# Patient Record
Sex: Female | Born: 1950 | Race: Black or African American | Hispanic: No | Marital: Married | Smoking: Never smoker
Health system: Southern US, Community
[De-identification: ages and names within clinical notes are randomized; demographics above are authoritative.]

## PROBLEM LIST (undated history)

## (undated) DIAGNOSIS — J45909 Unspecified asthma, uncomplicated: Secondary | ICD-10-CM

## (undated) DIAGNOSIS — F419 Anxiety disorder, unspecified: Secondary | ICD-10-CM

## (undated) DIAGNOSIS — D649 Anemia, unspecified: Secondary | ICD-10-CM

## (undated) DIAGNOSIS — I2510 Atherosclerotic heart disease of native coronary artery without angina pectoris: Secondary | ICD-10-CM

## (undated) DIAGNOSIS — I219 Acute myocardial infarction, unspecified: Secondary | ICD-10-CM

## (undated) DIAGNOSIS — G4700 Insomnia, unspecified: Secondary | ICD-10-CM

## (undated) DIAGNOSIS — I10 Essential (primary) hypertension: Secondary | ICD-10-CM

## (undated) DIAGNOSIS — K219 Gastro-esophageal reflux disease without esophagitis: Secondary | ICD-10-CM

## (undated) DIAGNOSIS — E785 Hyperlipidemia, unspecified: Secondary | ICD-10-CM

## (undated) DIAGNOSIS — M1990 Unspecified osteoarthritis, unspecified site: Secondary | ICD-10-CM

## (undated) DIAGNOSIS — F32A Depression, unspecified: Secondary | ICD-10-CM

## (undated) DIAGNOSIS — E119 Type 2 diabetes mellitus without complications: Secondary | ICD-10-CM

## (undated) HISTORY — PX: BREAST SURGERY: SHX581

## (undated) HISTORY — PX: TUBAL LIGATION: SHX77

## (undated) HISTORY — DX: Type 2 diabetes mellitus without complications: E11.9

## (undated) HISTORY — DX: Anxiety disorder, unspecified: F41.9

## (undated) HISTORY — DX: Hyperlipidemia, unspecified: E78.5

## (undated) HISTORY — PX: ABDOMINAL HYSTERECTOMY: SHX81

## (undated) HISTORY — DX: Unspecified asthma, uncomplicated: J45.909

## (undated) HISTORY — DX: Gastro-esophageal reflux disease without esophagitis: K21.9

## (undated) HISTORY — DX: Insomnia, unspecified: G47.00

---

## 1993-06-10 HISTORY — PX: REDUCTION MAMMAPLASTY: SUR839

## 2005-01-22 ENCOUNTER — Ambulatory Visit: Payer: Self-pay | Admitting: Unknown Physician Specialty

## 2006-03-21 ENCOUNTER — Ambulatory Visit: Payer: Self-pay | Admitting: Family Medicine

## 2007-03-11 LAB — HM DEXA SCAN

## 2007-05-05 ENCOUNTER — Ambulatory Visit: Payer: Self-pay | Admitting: Family Medicine

## 2007-09-26 ENCOUNTER — Ambulatory Visit: Payer: Self-pay | Admitting: Family Medicine

## 2008-01-26 ENCOUNTER — Ambulatory Visit: Payer: Self-pay | Admitting: Internal Medicine

## 2008-01-27 ENCOUNTER — Ambulatory Visit: Payer: Self-pay | Admitting: Internal Medicine

## 2008-05-11 ENCOUNTER — Ambulatory Visit: Payer: Self-pay | Admitting: Family Medicine

## 2008-08-24 ENCOUNTER — Ambulatory Visit: Payer: Self-pay | Admitting: Family Medicine

## 2009-08-28 ENCOUNTER — Ambulatory Visit: Payer: Self-pay | Admitting: Family Medicine

## 2011-02-26 ENCOUNTER — Ambulatory Visit: Payer: Self-pay | Admitting: Family Medicine

## 2011-12-04 ENCOUNTER — Ambulatory Visit: Payer: Self-pay | Admitting: Family Medicine

## 2012-06-29 ENCOUNTER — Ambulatory Visit: Payer: Self-pay | Admitting: Family Medicine

## 2013-06-18 ENCOUNTER — Ambulatory Visit: Payer: Self-pay | Admitting: Gastroenterology

## 2013-06-18 LAB — HM COLONOSCOPY: HM Colonoscopy: NORMAL

## 2013-07-13 LAB — LIPID PANEL
Cholesterol: 156 mg/dL (ref 0–200)
HDL: 35 mg/dL (ref 35–70)
LDL Cholesterol: 98 mg/dL
Triglycerides: 117 mg/dL (ref 40–160)

## 2013-09-23 LAB — HEMOGLOBIN A1C: Hgb A1c MFr Bld: 6.1 % — AB (ref 4.0–6.0)

## 2014-05-04 ENCOUNTER — Ambulatory Visit: Payer: Self-pay | Admitting: Family Medicine

## 2014-05-04 LAB — HM MAMMOGRAPHY: HM Mammogram: NORMAL

## 2014-11-18 ENCOUNTER — Encounter: Payer: Self-pay | Admitting: Family Medicine

## 2014-11-18 ENCOUNTER — Ambulatory Visit (INDEPENDENT_AMBULATORY_CARE_PROVIDER_SITE_OTHER): Payer: PPO | Admitting: Family Medicine

## 2014-11-18 VITALS — BP 124/68 | HR 92 | Temp 97.9°F | Resp 16 | Ht 63.0 in | Wt 163.8 lb

## 2014-11-18 DIAGNOSIS — I10 Essential (primary) hypertension: Secondary | ICD-10-CM | POA: Insufficient documentation

## 2014-11-18 DIAGNOSIS — E2839 Other primary ovarian failure: Secondary | ICD-10-CM

## 2014-11-18 DIAGNOSIS — E119 Type 2 diabetes mellitus without complications: Secondary | ICD-10-CM

## 2014-11-18 DIAGNOSIS — K219 Gastro-esophageal reflux disease without esophagitis: Secondary | ICD-10-CM | POA: Insufficient documentation

## 2014-11-18 DIAGNOSIS — J4520 Mild intermittent asthma, uncomplicated: Secondary | ICD-10-CM | POA: Insufficient documentation

## 2014-11-18 DIAGNOSIS — Z23 Encounter for immunization: Secondary | ICD-10-CM | POA: Diagnosis not present

## 2014-11-18 DIAGNOSIS — A6009 Herpesviral infection of other urogenital tract: Secondary | ICD-10-CM | POA: Insufficient documentation

## 2014-11-18 DIAGNOSIS — I2510 Atherosclerotic heart disease of native coronary artery without angina pectoris: Secondary | ICD-10-CM | POA: Insufficient documentation

## 2014-11-18 DIAGNOSIS — Z719 Counseling, unspecified: Secondary | ICD-10-CM

## 2014-11-18 DIAGNOSIS — Z0000 Encounter for general adult medical examination without abnormal findings: Secondary | ICD-10-CM | POA: Diagnosis not present

## 2014-11-18 DIAGNOSIS — E785 Hyperlipidemia, unspecified: Secondary | ICD-10-CM | POA: Diagnosis not present

## 2014-11-18 DIAGNOSIS — Z780 Asymptomatic menopausal state: Secondary | ICD-10-CM | POA: Insufficient documentation

## 2014-11-18 DIAGNOSIS — Z7189 Other specified counseling: Secondary | ICD-10-CM

## 2014-11-18 DIAGNOSIS — G4700 Insomnia, unspecified: Secondary | ICD-10-CM | POA: Insufficient documentation

## 2014-11-18 DIAGNOSIS — I252 Old myocardial infarction: Secondary | ICD-10-CM | POA: Insufficient documentation

## 2014-11-18 NOTE — Progress Notes (Signed)
Name: Dawn Werner   MRN: [id number]    DOB: 09-08-1950   Date:11/18/2014       Progress Note  Subjective  Chief Complaint  Chief Complaint  Patient presents with  . Annual Exam    HPI  Welcome to Medicare visit: the only complaint she has is right knee pain, seeing Ortho at [facility], going to have injections soon.  DMII: she has been taking Metformin and is tolerating it well. She checks fsbs a few times weekly and glucose is usually 120, highest 146, lowest level recently was 118.  Denies any neuropathic symptoms.  No weight changes, normal appetite, no hypoglycemic episodes.  CAD: taking statin, aspirin and denies any chest pain Last visit with Dr. Juliann Pares was Fall 2015   Patient Active Problem List   Diagnosis Date Noted  . Asthma, mild intermittent 11/18/2014  . Arteriosclerosis of coronary artery 11/18/2014  . Insomnia, persistent 11/18/2014  . Dyslipidemia 11/18/2014  . Acid reflux 11/18/2014  . Diabetes type 2, controlled 11/18/2014  . Genital herpes in women 11/18/2014  . HLD (hyperlipidemia) 11/18/2014  . Hypertension 11/18/2014  . Asymptomatic postmenopausal status 11/18/2014  . History of acute myocardial infarction 11/18/2014  . Arthritis of knee, degenerative 12/01/2013    Past Surgical History  Procedure Laterality Date  . Tubal ligation    . Abdominal hysterectomy    . Breast surgery      breast reduction    Family History  Problem Relation Age of Onset  . Hyperlipidemia Mother   . Hypertension Mother   . Stroke Mother   . Dementia Father   . Cancer Sister     breast  . Asthma Brother   . Depression Brother     bipolar  . Heart disease Brother   . Heart disease Brother     History   Social History  . Marital Status: Married    Spouse Name: N/A  . Number of Children: N/A  . Years of Education: N/A   Occupational History  . Not on file.   Social History Main Topics  . Smoking status: Never Smoker   . Smokeless tobacco: Not  on file  . Alcohol Use: No  . Drug Use: No  . Sexual Activity: Yes   Other Topics Concern  . Not on file   Social History Narrative  . No narrative on file     Current outpatient prescriptions:  .  acyclovir ointment (ZOVIRAX) 5 %, Apply 1 application topically 2 (two) times daily as needed., Disp: , Rfl:  .  albuterol (PROVENTIL HFA) 108 (90 BASE) MCG/ACT inhaler, Inhale 2 puffs into the lungs as needed., Disp: , Rfl:  .  aspirin EC 81 MG tablet, Take 1 tablet by mouth daily., Disp: , Rfl:  .  atorvastatin (LIPITOR) 80 MG tablet, Take 80 mg by mouth daily., Disp: , Rfl: 1 .  carvedilol (COREG) 6.25 MG tablet, Take 6.25 mg by mouth 2 (two) times daily., Disp: , Rfl: 1 .  LORazepam (ATIVAN) 0.5 MG tablet, Take by mouth at bedtime as needed. for sleep, Disp: , Rfl: 2 .  losartan (COZAAR) 50 MG tablet, Take 50 mg by mouth daily., Disp: , Rfl: 1 .  metFORMIN (GLUCOPHAGE) 500 MG tablet, Take 500 mg by mouth 2 (two) times daily., Disp: , Rfl: 1 .  omeprazole (PRILOSEC) 20 MG capsule, Take 1 capsule by mouth as needed., Disp: , Rfl:  .  valACYclovir (VALTREX) 500 MG tablet, Take 1 tablet by  mouth daily., Disp: , Rfl:  .  zolpidem (AMBIEN) 10 MG tablet, Take 10 mg by mouth at bedtime., Disp: , Rfl: 0  No Known Allergies   ROS  Constitutional: Negative for fever or weight change.  Respiratory: Negative for cough and shortness of breath.   Cardiovascular: Negative for chest pain or palpitations.  Gastrointestinal: Negative for abdominal pain, no bowel changes.  Musculoskeletal pain right knee Skin: Negative for rash.  Neurological: Negative for dizziness or headache.  No other specific complaints in a complete review of systems (except as listed in HPI above).  Objective  Filed Vitals:   11/18/14 0829  BP: 124/68  Pulse: 92  Temp: 97.9 F (36.6 C)  TempSrc: Oral  Resp: 16  Height: 5\' 3"  (1.6 m)  Weight: 163 lb 12.8 oz (74.299 kg)  SpO2: 95%    Body mass index is 29.02  kg/(m^2).  Physical Exam  Constitutional: Patient appears well-developed and well-nourished. No distress.  HENT: Head: Normocephalic and atraumatic. Ears: B TMs ok, no erythema or effusion; Nose: Nose normal. Mouth/Throat: Oropharynx is clear and moist. No oropharyngeal exudate.  Eyes: Conjunctivae and EOM are normal. Pupils are equal, round, and reactive to light. No scleral icterus.  Neck: Normal range of motion. Neck supple. No JVD present. No thyromegaly present.  Cardiovascular: Normal rate, regular rhythm and normal heart sounds.  No murmur heard. No BLE edema. Pulmonary/Chest: Effort normal and breath sounds normal. No respiratory distress. Abdominal: Soft. Bowel sounds are normal, no distension. There is no tenderness. no masses Breast: no lumps or masses, no nipple discharge or rashes FEMALE GENITALIA:  External genitalia normal External urethra normal RECTAL:  hemorrhoids Musculoskeletal: limping, mild right knee effusion, crepitus with extension of right knee, pain with rom of right knee Neurological: he is alert and oriented to person, place, and time. No cranial nerve deficit. Coordination, balance, strength, speech and gait are normal.  Skin: Skin is warm and dry. No rash noted. No erythema.  Psychiatric: Patient has a normal mood and affect. behavior is normal. Judgment and thought content normal.    Diabetic Foot Exam: Diabetic Foot Exam - Simple   Simple Foot Form  Visual Inspection  No deformities, no ulcerations, no other skin breakdown bilaterally:  Yes  Sensation Testing  Pulse Check  Comments      PHQ2/9: Depression screen [facility] 2/9 11/18/2014 11/18/2014  Decreased Interest 0 -  Down, Depressed, Hopeless 0 0  PHQ - 2 Score 0 0     Fall Risk: Fall Risk  11/18/2014  Falls in the past year? No    Functional Status Survey: Is the patient deaf or have difficulty hearing?: No Does the patient have difficulty seeing, even when wearing glasses/contacts?: Yes  (glasses) Does the patient have difficulty concentrating, remembering, or making decisions?: No Does the patient have difficulty walking or climbing stairs?: No Does the patient have difficulty dressing or bathing?: No Does the patient have difficulty doing errands alone such as visiting a doctor's office or shopping?: No   Assessment & Plan   1. Medicare welcome visit  Functional ability/safety issues: No Issues Hearing issues: Addressed  Activities of daily living: Discussed Home safety issues: No Issues  End Of Life Planning: Offered verbal information regarding advanced directives, healthcare power of attorney.  Preventative care, Health maintenance, Preventative health measures discussed.  Preventative screenings discussed today: lab work, colonoscopy, PAP, mammogram, DEXA.  Low Dose CT Chest recommended if Age 73-80 years, 30 pack-year currently smoking OR have quit  w/in 15years.   Lifestyle risk factor issued reviewed: Diet, exercise, weight management, advised patient smoking is not healthy, nutrition/diet.  Preventative health measures discussed (5-10 year plan).  Reviewed and recommended vaccinations: - Pneumovax  - Prevnar  - Annual Influenza - Zostavax - Tdap   Depression screening: Done Fall risk screening: Done Discuss ADLs/IADLs: Done  Current medical providers: See HPI  Other health risk factors identified this visit: No other issues Cognitive impairment issues: None identified  All above discussed with patient. Appropriate education, counseling and referral will be made based upon the above.    2. HLD (hyperlipidemia)  - Lipid Profile  3. Diabetes type 2, controlled  - HgB A1c  4. Essential hypertension She does not want to have an EKG today, had one done by Dr. Juliann Pares in Nov 2015 - Basic Metabolic Panel (BMET) - CBC with Differential  5. Health counseling Discussed importance of 150 minutes of physical activity weekly, eat two servings  of fish weekly, eat one serving of tree nuts ( cashews, pistachios, pecans, almonds.Marland Kitchen) every other day, eat 6 servings of fruit/vegetables daily and drink plenty of water and avoid sweet beverages.   6. Need for 23-polyvalent pneumococcal polysaccharide vaccine  - Pneumococcal polysaccharide vaccine 23-valent greater than or equal to 2yo subcutaneous/IM  7. Ovarian failure  - DG Bone Density; Future

## 2014-11-18 NOTE — Patient Instructions (Signed)
  Ms. Devoto , Thank you for taking time to come for your Medicare Wellness Visit. I appreciate your ongoing commitment to your health goals. Please review the following plan we discussed and let me know if I can assist you in the future.      This is a list of the screening recommended for you and due dates:  Health Maintenance  Topic Date Due  . Shingles Vaccine  01/17/2015*  . HIV Screening  05/06/2018*  . Urine Protein Check  12/25/2014  . Flu Shot  01/09/2015  . Hemoglobin A1C  05/20/2015  . Eye exam for diabetics  11/04/2015  . Complete foot exam   11/18/2015  . Pneumonia vaccines (2 of 2 - PPSV23) 11/18/2015  . Mammogram  05/04/2016  . Tetanus Vaccine  04/09/2017  . Colon Cancer Screening  06/19/2023  . DEXA scan (bone density measurement)  Completed  *Topic was postponed. The date shown is not the original due date.

## 2014-11-18 NOTE — Addendum Note (Signed)
Addended by: Alba Cory F on: 11/18/2014 01:59 PM   Modules accepted: Level of Service, SmartSet

## 2014-11-19 LAB — LIPID PANEL
Chol/HDL Ratio: 5.9 ratio units — ABNORMAL HIGH (ref 0.0–4.4)
Cholesterol, Total: 225 mg/dL — ABNORMAL HIGH (ref 100–199)
HDL: 38 mg/dL — ABNORMAL LOW (ref >39)
LDL Calculated: 140 mg/dL — ABNORMAL HIGH (ref 0–99)
Triglycerides: 235 mg/dL — ABNORMAL HIGH (ref 0–149)
VLDL Cholesterol Cal: 47 mg/dL — ABNORMAL HIGH (ref 5–40)

## 2014-11-19 LAB — CBC WITH DIFFERENTIAL/PLATELET
Basophils Absolute: 0 10*3/uL (ref 0.0–0.2)
Basos: 1 %
EOS (ABSOLUTE): 0.1 10*3/uL (ref 0.0–0.4)
Eos: 2 %
Hematocrit: 40.3 % (ref 34.0–46.6)
Hemoglobin: 13.7 g/dL (ref 11.1–15.9)
Immature Grans (Abs): 0 10*3/uL (ref 0.0–0.1)
Immature Granulocytes: 0 %
Lymphocytes Absolute: 2 10*3/uL (ref 0.7–3.1)
Lymphs: 27 %
MCH: 29 pg (ref 26.6–33.0)
MCHC: 34 g/dL (ref 31.5–35.7)
MCV: 85 fL (ref 79–97)
Monocytes Absolute: 0.6 10*3/uL (ref 0.1–0.9)
Monocytes: 8 %
Neutrophils Absolute: 4.8 10*3/uL (ref 1.4–7.0)
Neutrophils: 62 %
Platelets: 356 10*3/uL (ref 150–379)
RBC: 4.72 x10E6/uL (ref 3.77–5.28)
RDW: 12.5 % (ref 12.3–15.4)
WBC: 7.6 10*3/uL (ref 3.4–10.8)

## 2014-11-19 LAB — BASIC METABOLIC PANEL
BUN/Creatinine Ratio: 16 (ref 11–26)
BUN: 9 mg/dL (ref 8–27)
CO2: 25 mmol/L (ref 18–29)
Calcium: 9.9 mg/dL (ref 8.7–10.3)
Chloride: 102 mmol/L (ref 97–108)
Creatinine, Ser: 0.57 mg/dL (ref 0.57–1.00)
GFR calc Af Amer: 113 mL/min/{1.73_m2} (ref >59)
GFR calc non Af Amer: 98 mL/min/{1.73_m2} (ref >59)
Glucose: 126 mg/dL — ABNORMAL HIGH (ref 65–99)
Potassium: 4.3 mmol/L (ref 3.5–5.2)
Sodium: 143 mmol/L (ref 134–144)

## 2014-11-19 LAB — HEMOGLOBIN A1C
Est. average glucose Bld gHb Est-mCnc: 146 mg/dL
Hgb A1c MFr Bld: 6.7 % — ABNORMAL HIGH (ref 4.8–5.6)

## 2014-11-21 ENCOUNTER — Telehealth: Payer: Self-pay

## 2014-11-21 MED ORDER — ROSUVASTATIN CALCIUM 40 MG PO TABS: 40.0000 mg | ORAL_TABLET | Freq: Every day | ORAL | Status: DC

## 2014-11-21 NOTE — Telephone Encounter (Signed)
Sent Crestor

## 2014-11-21 NOTE — Telephone Encounter (Signed)
Patient notified of labs and states she is taking Lipitor every day. Could you please send in new medication Crestor 40 mg to pharmacy.

## 2014-11-21 NOTE — Telephone Encounter (Signed)
-----   Message from Dawn Cory, MD sent at 11/20/2014  9:19 AM EDT ----- Her lipid panel is out of control. Triglycerides is up, HDL is low and LDL is not at goal. Is she taking her Lipitor daily? If so we will change to Crestor 40mg  daily . Her fasting glucose was at goal and she also had a normal kidney and liver function test hgbA1C is going up, please make sure she is following a diabetic diet. No anemia,normal WBC Please notify patient, thank you

## 2014-12-16 ENCOUNTER — Other Ambulatory Visit: Payer: Self-pay | Admitting: Family Medicine

## 2014-12-21 ENCOUNTER — Other Ambulatory Visit: Payer: Self-pay | Admitting: Family Medicine

## 2014-12-21 NOTE — Telephone Encounter (Signed)
Called pharmacy and gave them the prescription that was on file from 12/17/14 from Dr. Carlynn PurlSowles for Ambien 10 mg 90 day supply and informed patient.

## 2014-12-21 NOTE — Telephone Encounter (Signed)
Called into pharmacy and patient notified

## 2014-12-21 NOTE — Telephone Encounter (Signed)
Pt is checking status on ambien. The pharmacy told her that she need a new prescription. Patient does not understand because she pays out of pocket. Please return her call to clarify [phone]

## 2014-12-28 ENCOUNTER — Other Ambulatory Visit: Payer: Self-pay | Admitting: Orthopedic Surgery

## 2014-12-28 DIAGNOSIS — M25561 Pain in right knee: Secondary | ICD-10-CM

## 2014-12-28 DIAGNOSIS — M1711 Unilateral primary osteoarthritis, right knee: Secondary | ICD-10-CM

## 2015-01-04 ENCOUNTER — Ambulatory Visit: Payer: Self-pay

## 2015-01-10 ENCOUNTER — Ambulatory Visit
Admission: RE | Admit: 2015-01-10 | Discharge: 2015-01-10 | Disposition: A | Discharge status: Home / Self Care | Payer: PPO | Source: Ambulatory Visit | Attending: Orthopedic Surgery | Admitting: Orthopedic Surgery

## 2015-01-10 DIAGNOSIS — X58XXXA Exposure to other specified factors, initial encounter: Secondary | ICD-10-CM | POA: Diagnosis not present

## 2015-01-10 DIAGNOSIS — M9420 Chondromalacia, unspecified site: Secondary | ICD-10-CM | POA: Diagnosis not present

## 2015-01-10 DIAGNOSIS — M1711 Unilateral primary osteoarthritis, right knee: Secondary | ICD-10-CM | POA: Diagnosis present

## 2015-01-10 DIAGNOSIS — S83281A Other tear of lateral meniscus, current injury, right knee, initial encounter: Secondary | ICD-10-CM | POA: Diagnosis not present

## 2015-01-10 DIAGNOSIS — M25561 Pain in right knee: Secondary | ICD-10-CM | POA: Diagnosis present

## 2015-01-24 ENCOUNTER — Encounter: Payer: Self-pay | Admitting: Family Medicine

## 2015-01-24 ENCOUNTER — Ambulatory Visit (INDEPENDENT_AMBULATORY_CARE_PROVIDER_SITE_OTHER): Payer: PPO | Admitting: Family Medicine

## 2015-01-24 VITALS — BP 124/62 | HR 84 | Temp 98.2°F | Resp 16 | Ht 63.0 in | Wt 164.1 lb

## 2015-01-24 DIAGNOSIS — E785 Hyperlipidemia, unspecified: Secondary | ICD-10-CM | POA: Diagnosis not present

## 2015-01-24 DIAGNOSIS — M109 Gout, unspecified: Secondary | ICD-10-CM | POA: Insufficient documentation

## 2015-01-24 DIAGNOSIS — M1710 Unilateral primary osteoarthritis, unspecified knee: Secondary | ICD-10-CM | POA: Insufficient documentation

## 2015-01-24 DIAGNOSIS — M1711 Unilateral primary osteoarthritis, right knee: Secondary | ICD-10-CM | POA: Diagnosis not present

## 2015-01-24 DIAGNOSIS — M1000 Idiopathic gout, unspecified site: Secondary | ICD-10-CM | POA: Diagnosis not present

## 2015-01-24 MED ORDER — COLCHICINE 0.6 MG PO TABS
0.6000 mg | ORAL_TABLET | Freq: Every day | ORAL | Status: DC
Start: 2015-01-24 — End: 2021-03-02

## 2015-01-24 NOTE — Patient Instructions (Signed)
Lipid panel shows low HDL : to improve HDL patient  needs to eat tree nuts ( pecans/pistachios/almonds ) four times weekly, eat fish two times weekly  and exercise  at least 150 minutes per week 

## 2015-01-24 NOTE — Progress Notes (Signed)
Name: Dawn Werner   MRN: [id number]    DOB: 01/06/1951   Date:01/24/2015       Progress Note  Subjective  Chief Complaint  Chief Complaint  Patient presents with  . Foot Swelling    Onset-Thursday 01/19/2015, Right Big Toe has been swollen. Painful, can not bend her toe, and patient could not keep any pressure on that foot, OTC Ibuprofen-with some relief.    HPI  Joint pain: she states two weeks ago she developed redness, swelling and tenderness of right index finger and MCP joint, symptoms lasted about two weeks and resolved by itself. Last week she developed same symptoms this time on the right first toe. She states symptoms affecting her ability to walk. Pain has resolved, but still has some stiffness on right first toe. No previous history of gout, no change in diet or medication, no trauma.   Right knee pain: seen by the Ortho, had MRI, reviewed with patient today, symptoms right knee are improving and she is not interested in surgery at this time  Hyperlipidemia: she is back on Crestor  but now taking it daily   Patient Active Problem List   Diagnosis Date Noted  . 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 Asthma, mild intermittent 11/18/2014  . Arteriosclerosis of coronary artery 11/18/2014  . Insomnia, persistent 11/18/2014  . Dyslipidemia 11/18/2014  . Acid reflux 11/18/2014  . Diabetes type 2, controlled 11/18/2014  . Genital herpes in women 11/18/2014  . HLD (hyperlipidemia) 11/18/2014  . Hypertension 11/18/2014  . Asymptomatic postmenopausal status 11/18/2014  . History of acute myocardial infarction 11/18/2014  . Arthritis of knee, degenerative 12/01/2013    Past Surgical History  Procedure Laterality Date  . Tubal ligation    . Abdominal hysterectomy    . Breast surgery      breast reduction    Family History  Problem Relation Age of Onset  . Hyperlipidemia Mother   . Hypertension Mother   . Stroke Mother   . Dementia Father   . Cancer Sister     breast  .  Asthma Brother   . Depression Brother     bipolar  . Heart disease Brother   . Heart disease Brother     Social History   Social History  . Marital Status: Married    Spouse Name: N/A  . Number of Children: N/A  . Years of Education: N/A   Occupational History  . Not on file.   Social History Main Topics  . Smoking status: Never Smoker   . Smokeless tobacco: Never Used  . Alcohol Use: No  . Drug Use: No  . Sexual Activity:    Partners: Male   Other Topics Concern  . Not on file   Social History Narrative     Current outpatient prescriptions:  .  acyclovir ointment (ZOVIRAX) 5 %, Apply 1 application topically 2 (two) times daily as needed., Disp: , Rfl:  .  albuterol (PROVENTIL HFA) 108 (90 BASE) MCG/ACT inhaler, Inhale 2 puffs into the lungs as needed., Disp: , Rfl:  .  aspirin EC 81 MG tablet, Take 1 tablet by mouth daily., Disp: , Rfl:  .  carvedilol (COREG) 6.25 MG tablet, Take 6.25 mg by mouth 2 (two) times daily., Disp: , Rfl: 1 .  LORazepam (ATIVAN) 0.5 MG tablet, Take by mouth at bedtime as needed. for sleep, Disp: , Rfl: 2 .  losartan (COZAAR) 50 MG tablet, Take 50 mg by mouth daily., Disp: , Rfl: 1 .  metFORMIN (GLUCOPHAGE) 500 MG tablet, Take 500 mg by mouth 2 (two) times daily., Disp: , Rfl: 1 .  omeprazole (PRILOSEC) 20 MG capsule, Take 1 capsule by mouth as needed., Disp: , Rfl:  .  rosuvastatin (CRESTOR) 40 MG tablet, Take 1 tablet (40 mg total) by mouth daily., Disp: 90 tablet, Rfl: 1 .  valACYclovir (VALTREX) 500 MG tablet, Take 1 tablet by mouth daily., Disp: , Rfl:  .  zolpidem (AMBIEN) 10 MG tablet, TAKE 1 TABLET BY MOUTH AT BEDTIME, Disp: 90 tablet, Rfl: 0 .  colchicine 0.6 MG tablet, Take 1 tablet (0.6 mg total) by mouth daily. Take two at onset of gout and repeat in 1 hour, prn, Disp: 30 tablet, Rfl: 0  No Known Allergies   ROS  Constitutional: Negative for fever or weight change.  Respiratory: Negative for cough and shortness of breath.    Cardiovascular: Negative for chest pain or palpitations.  Gastrointestinal: Negative for abdominal pain, no bowel changes.  Musculoskeletal: Negative for gait problem or joint swelling.  Skin: Negative for rash.  Neurological: Negative for dizziness or headache.  No other specific complaints in a complete review of systems (except as listed in HPI above).   Objective  Filed Vitals:   01/24/15 0907  BP: 124/62  Pulse: 84  Temp: 98.2 F (36.8 C)  TempSrc: Oral  Resp: 16  Height: 5\' 3"  (1.6 m)  Weight: 164 lb 1.6 oz (74.435 kg)  SpO2: 97%    Body mass index is 29.08 kg/(m^2).  Physical Exam  Constitutional: Patient appears well-developed and well-nourished. Obese  No distress.  HEENT: head atraumatic, normocephalic, pupils equal and reactive to light,  neck supple, throat within normal limits Cardiovascular: Normal rate, regular rhythm and normal heart sounds.  No murmur heard. No BLE edema. Pulmonary/Chest: Effort normal and breath sounds normal. No respiratory distress. Abdominal: Soft.  There is no tenderness. Psychiatric: Patient has a normal mood and affect. behavior is normal. Judgment and thought content normal.  Recent Results (from the past 2160 hour(s))  Lipid Profile     Status: Abnormal   Collection Time: 11/18/14 10:02 AM  Result Value Ref Range   Cholesterol, Total 225 (H) 100 - 199 mg/dL   Triglycerides 161 (H) 0 - 149 mg/dL   HDL 38 (L) >09 mg/dL    Comment: According to ATP-III Guidelines, HDL-C >59 mg/dL is considered a negative risk factor for CHD.    VLDL Cholesterol Cal 47 (H) 5 - 40 mg/dL   LDL Calculated 604 (H) 0 - 99 mg/dL   Chol/HDL Ratio 5.9 (H) 0.0 - 4.4 ratio units    Comment:                                   T. Chol/HDL Ratio                                             Men  Women                               1/2 Avg.Risk  3.4    3.3  Avg.Risk  5.0    4.4                                2X Avg.Risk  9.6     7.1                                3X Avg.Risk 23.4   11.0   Basic Metabolic Panel (BMET)     Status: Abnormal   Collection Time: 11/18/14 10:02 AM  Result Value Ref Range   Glucose 126 (H) 65 - 99 mg/dL   BUN 9 8 - 27 mg/dL   Creatinine, Ser 1.61 0.57 - 1.00 mg/dL   GFR calc non Af Amer 98 >59 mL/min/1.73   GFR calc Af Amer 113 >59 mL/min/1.73   BUN/Creatinine Ratio 16 11 - 26   Sodium 143 134 - 144 mmol/L   Potassium 4.3 3.5 - 5.2 mmol/L   Chloride 102 97 - 108 mmol/L   CO2 25 18 - 29 mmol/L   Calcium 9.9 8.7 - 10.3 mg/dL  HgB W9U     Status: Abnormal   Collection Time: 11/18/14 10:02 AM  Result Value Ref Range   Hgb A1c MFr Bld 6.7 (H) 4.8 - 5.6 %    Comment:          Pre-diabetes: 5.7 - 6.4          Diabetes: >6.4          Glycemic control for adults with diabetes: <7.0    Est. average glucose Bld gHb Est-mCnc 146 mg/dL  CBC with Differential     Status: None   Collection Time: 11/18/14 10:02 AM  Result Value Ref Range   WBC 7.6 3.4 - 10.8 x10E3/uL   RBC 4.72 3.77 - 5.28 x10E6/uL   Hemoglobin 13.7 11.1 - 15.9 g/dL   Hematocrit 04.5 40.9 - 46.6 %   MCV 85 79 - 97 fL   MCH 29.0 26.6 - 33.0 pg   MCHC 34.0 31.5 - 35.7 g/dL   RDW 81.1 91.4 - 78.2 %   Platelets 356 150 - 379 x10E3/uL   Neutrophils 62 %   Lymphs 27 %   Monocytes 8 %   Eos 2 %   Basos 1 %   Neutrophils Absolute 4.8 1.4 - 7.0 x10E3/uL   Lymphocytes Absolute 2.0 0.7 - 3.1 x10E3/uL   Monocytes Absolute 0.6 0.1 - 0.9 x10E3/uL   EOS (ABSOLUTE) 0.1 0.0 - 0.4 x10E3/uL   Basophils Absolute 0.0 0.0 - 0.2 x10E3/uL   Immature Granulocytes 0 %   Immature Grans (Abs) 0.0 0.0 - 0.1 x10E3/uL    PHQ2/9: Depression screen [facility] 2/9 01/24/2015 11/18/2014 11/18/2014  Decreased Interest 0 0 -  Down, Depressed, Hopeless 0 0 0  PHQ - 2 Score 0 0 0    Fall Risk: Fall Risk  01/24/2015 11/18/2014 11/18/2014  Falls in the past year? No No No     Assessment & Plan  1. Podagra We will check labs and try colchicine prn,  return during an acute attack if possible - colchicine 0.6 MG tablet; Take 1 tablet (0.6 mg total) by mouth daily. Take two at onset of gout and repeat in 1 hour, prn  Dispense: 30 tablet; Refill: 0 - Uric acid  2. Primary osteoarthritis of knee, right Continue follow up with Ortho  3. HLD (hyperlipidemia) Lipid panel  shows low HDL : to improve HDL patient  needs to eat tree nuts ( pecans/pistachios/almonds ) four times weekly, eat fish two times weekly  and exercise  at least 150 minutes per week

## 2015-02-20 ENCOUNTER — Ambulatory Visit (INDEPENDENT_AMBULATORY_CARE_PROVIDER_SITE_OTHER): Payer: PPO | Admitting: Family Medicine

## 2015-02-20 ENCOUNTER — Encounter: Payer: Self-pay | Admitting: Family Medicine

## 2015-02-20 VITALS — BP 112/62 | HR 75 | Temp 97.7°F | Resp 16 | Ht 63.0 in | Wt 163.8 lb

## 2015-02-20 DIAGNOSIS — M1711 Unilateral primary osteoarthritis, right knee: Secondary | ICD-10-CM | POA: Diagnosis not present

## 2015-02-20 DIAGNOSIS — G4700 Insomnia, unspecified: Secondary | ICD-10-CM

## 2015-02-20 DIAGNOSIS — I10 Essential (primary) hypertension: Secondary | ICD-10-CM

## 2015-02-20 DIAGNOSIS — J4520 Mild intermittent asthma, uncomplicated: Secondary | ICD-10-CM

## 2015-02-20 DIAGNOSIS — E785 Hyperlipidemia, unspecified: Secondary | ICD-10-CM

## 2015-02-20 DIAGNOSIS — E119 Type 2 diabetes mellitus without complications: Secondary | ICD-10-CM

## 2015-02-20 DIAGNOSIS — I2510 Atherosclerotic heart disease of native coronary artery without angina pectoris: Secondary | ICD-10-CM

## 2015-02-20 DIAGNOSIS — M1000 Idiopathic gout, unspecified site: Secondary | ICD-10-CM

## 2015-02-20 DIAGNOSIS — Z23 Encounter for immunization: Secondary | ICD-10-CM | POA: Diagnosis not present

## 2015-02-20 DIAGNOSIS — M109 Gout, unspecified: Secondary | ICD-10-CM

## 2015-02-20 LAB — POCT UA - MICROALBUMIN: Microalbumin Ur, POC: 20 mg/L

## 2015-02-20 LAB — POCT GLYCOSYLATED HEMOGLOBIN (HGB A1C): Hemoglobin A1C: 6.4

## 2015-02-20 NOTE — Progress Notes (Signed)
   02/20/15 0828  Asthma History  Symptoms 0-2 days/week  Nighttime Awakenings 0-2/month  Asthma interference with normal activity No limitations  SABA use (not for EIB) 0-2 days/wk  Risk: Exacerbations requiring oral systemic steroids 0-1 / year  Asthma Severity Intermittent

## 2015-02-20 NOTE — Progress Notes (Signed)
Name: Dawn Werner   MRN: [id number]    DOB: 1951/01/12   Date:02/20/2015       Progress Note  Subjective  Chief Complaint  Chief Complaint  Patient presents with  . Medication Refill    3 month F/U  . Asthma  . Insomnia  . Hyperlipidemia    Patient was having extreme muscle cramps in her legs due to medication, so patient only takes half a pill daily-since then cramping has stopped  . Diabetes    Checks BS twice weekly, Low-121 Average-123 High-158 Pt states cannot take metformin making her have dirrhea and vomiting.  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 Ambien works well for her, she takes half to one one pill qhs. No side effects  DM II: glucose is improving, she needs a new meter but is not sure what is covered by her insurance and will send me a note with the name of the meter that she needs. No polydipsia, polyuria or polyphagia.  Compliant with her medications.   CAD: sees Dr. Juliann Pares, last appointment Nov 2015, no chest pain, or decrease in exercise tolerance, feeling well  Hyperlipidemia: she is on Crestor 40 mg, she states the cost was very high, and she also states high dose caused calf pain, she is doing well on half pill daily. No side effect now. She has been compliant for the past few months, last labs were not at goal.   Asthma Mild Intermittent: she states she has been doing well, no wheezing, no SOB, she has a dry cough , but less than once week, she has not used Albuterol for the past few months.    02/20/15 0828  Asthma History  Symptoms 0-2 days/week  Nighttime Awakenings 0-2/month  Asthma interference with normal activity No limitations  SABA use (not for EIB) 0-2 days/wk  Risk: Exacerbations requiring oral systemic steroids 0-1 / year  Asthma Severity Intermittent    Patient  Active Problem List   Diagnosis Date Noted  . 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 Primary osteoarthritis of knee 01/24/2015  . Asthma, mild intermittent 11/18/2014  . Arteriosclerosis of coronary artery 11/18/2014  . Insomnia, persistent 11/18/2014  . Dyslipidemia 11/18/2014  . Acid reflux 11/18/2014  . Diabetes type 2, controlled 11/18/2014  . Genital herpes in women 11/18/2014  . HLD (hyperlipidemia) 11/18/2014  . Hypertension 11/18/2014  . Asymptomatic postmenopausal status 11/18/2014  . History of acute myocardial infarction 11/18/2014  . Arthritis of knee, degenerative 12/01/2013    Past Surgical History  Procedure Laterality Date  . Tubal ligation    . Abdominal hysterectomy    . Breast surgery      breast reduction    Family History  Problem Relation Age of Onset  . Hyperlipidemia Mother   . Hypertension Mother   . Stroke Mother   . Dementia Father   . Cancer Sister     breast  . Asthma Brother   . Depression Brother     bipolar  . Heart disease Brother   . Heart disease Brother     Social History   Social History  . Marital Status: Married    Spouse Name: N/A  . Number of Children: N/A  . Years of Education: N/A   Occupational History  .  Not on file.   Social History Main Topics  . Smoking status: Never Smoker   . Smokeless tobacco: Never Used  . Alcohol Use: No  . Drug Use: No  . Sexual Activity:    Partners: Male   Other Topics Concern  . Not on file   Social History Narrative     Current outpatient prescriptions:  .  acyclovir ointment (ZOVIRAX) 5 %, Apply 1 application topically 2 (two) times daily as needed., Disp: , Rfl:  .  albuterol (PROVENTIL HFA) 108 (90 BASE) MCG/ACT inhaler, Inhale 2 puffs into the lungs as needed., Disp: , Rfl:  .  aspirin EC 81 MG tablet, Take 1 tablet by mouth daily., Disp: , Rfl:  .  carvedilol (COREG) 6.25 MG tablet, Take 6.25 mg by mouth 2 (two) times daily., Disp: , Rfl: 1 .  colchicine 0.6 MG tablet, Take 1 tablet  (0.6 mg total) by mouth daily. Take two at onset of gout and repeat in 1 hour, prn, Disp: 30 tablet, Rfl: 0 .  LORazepam (ATIVAN) 0.5 MG tablet, Take by mouth at bedtime as needed. for sleep, Disp: , Rfl: 2 .  losartan (COZAAR) 50 MG tablet, Take 50 mg by mouth daily., Disp: , Rfl: 1 .  omeprazole (PRILOSEC) 20 MG capsule, Take 1 capsule by mouth as needed., Disp: , Rfl:  .  rosuvastatin (CRESTOR) 40 MG tablet, Take 1 tablet (40 mg total) by mouth daily., Disp: 90 tablet, Rfl: 1 .  valACYclovir (VALTREX) 500 MG tablet, Take 1 tablet by mouth daily., Disp: , Rfl:  .  zolpidem (AMBIEN) 10 MG tablet, TAKE 1 TABLET BY MOUTH AT BEDTIME, Disp: 90 tablet, Rfl: 0 .  metFORMIN (GLUCOPHAGE) 500 MG tablet, Take 500 mg by mouth 2 (two) times daily., Disp: , Rfl: 1  No Known Allergies   ROS  Constitutional: Negative for fever or weight change.  Respiratory: Negative for cough and shortness of breath.   Cardiovascular: Negative for chest pain or palpitations.  Gastrointestinal: Negative for abdominal pain, no bowel changes.  Musculoskeletal: Negative for gait problem or joint swelling.  Skin: Negative for rash.  Neurological: Negative for dizziness or headache.  No other specific complaints in a complete review of systems (except as listed in HPI above).   Objective  Filed Vitals:   02/20/15 0808  BP: 112/62  Pulse: 75  Temp: 97.7 F (36.5 C)  TempSrc: Oral  Resp: 16  Height: 5\' 3"  (1.6 m)  Weight: 163 lb 12.8 oz (74.299 kg)  SpO2: 96%    Body mass index is 29.02 kg/(m^2).  Physical Exam  Constitutional: Patient appears well-developed and well-nourished. Obese No distress.  HEENT: head atraumatic, normocephalic, pupils equal and reactive to light, neck supple, throat within normal limits Cardiovascular: Normal rate, regular rhythm and normal heart sounds.  No murmur heard. No BLE edema. Pulmonary/Chest: Effort normal and breath sounds normal. No respiratory distress. Abdominal: Soft.   There is no tenderness. Psychiatric: Patient has a normal mood and affect. behavior is normal. Judgment and thought content normal.  Recent Results (from the past 2160 hour(s))  POCT HgB A1C     Status: Abnormal   Collection Time: 02/20/15  8:12 AM  Result Value Ref Range   Hemoglobin A1C 6.4   POCT UA - Microalbumin     Status: Abnormal   Collection Time: 02/20/15  8:12 AM  Result Value Ref Range   Microalbumin Ur, POC 20 mg/L   Creatinine, POC  mg/dL   Albumin/Creatinine  Ratio, Urine, POC       PHQ2/9: Depression screen [facility] 2/9 01/24/2015 11/18/2014 11/18/2014  Decreased Interest 0 0 -  Down, Depressed, Hopeless 0 0 0  PHQ - 2 Score 0 0 0     Fall Risk: Fall Risk  01/24/2015 11/18/2014 11/18/2014  Falls in the past year? No No No     Assessment & Plan  1. Diabetes type 2, controlled Continue Metformin, diet and exercise - POCT HgB A1C - POCT UA - Microalbumin  2. Needs flu shot  - Flu vaccine HIGH DOSE PF (Fluzone High dose)  3. Podagra Symptoms resolved, she will get labs today  4. HLD (hyperlipidemia) Lipid panel shows low HDL : to improve HDL patient  needs to eat tree nuts ( pecans/pistachios/almonds ) four times weekly, eat fish two times weekly  and exercise  at least 150 minutes per week. Continue medication   5. Essential hypertension At goal   6. Arteriosclerosis of coronary artery Continue aspirin, statin, and beta-blocker  7. Need for shingles vaccine  - Varicella-zoster vaccine subcutaneous  8. Insomnia, persistent Continue medication daily  9. Asthma, mild intermittent, uncomplicated Doing well on prn medication   10. Primary osteoarthritis of right knee Follow up with Ortho, reviewed MRI results with her today, pain is not as severe now

## 2015-03-21 ENCOUNTER — Other Ambulatory Visit: Payer: Self-pay | Admitting: Family Medicine

## 2015-03-22 NOTE — Telephone Encounter (Signed)
Appointment made for January 2017 °

## 2015-03-22 NOTE — Telephone Encounter (Signed)
Left voice message to schedule appointment.

## 2015-04-08 ENCOUNTER — Other Ambulatory Visit: Payer: Self-pay | Admitting: Family Medicine

## 2015-06-13 ENCOUNTER — Encounter: Payer: Self-pay | Admitting: Family Medicine

## 2015-06-13 ENCOUNTER — Ambulatory Visit (INDEPENDENT_AMBULATORY_CARE_PROVIDER_SITE_OTHER): Payer: PPO | Admitting: Family Medicine

## 2015-06-13 VITALS — BP 116/68 | HR 84 | Temp 98.2°F | Resp 18 | Ht 63.0 in | Wt 167.5 lb

## 2015-06-13 DIAGNOSIS — M7701 Medial epicondylitis, right elbow: Secondary | ICD-10-CM

## 2015-06-13 DIAGNOSIS — E1121 Type 2 diabetes mellitus with diabetic nephropathy: Secondary | ICD-10-CM

## 2015-06-13 DIAGNOSIS — A6009 Herpesviral infection of other urogenital tract: Secondary | ICD-10-CM

## 2015-06-13 DIAGNOSIS — I252 Old myocardial infarction: Secondary | ICD-10-CM | POA: Diagnosis not present

## 2015-06-13 DIAGNOSIS — A609 Anogenital herpesviral infection, unspecified: Secondary | ICD-10-CM | POA: Diagnosis not present

## 2015-06-13 DIAGNOSIS — E785 Hyperlipidemia, unspecified: Secondary | ICD-10-CM | POA: Diagnosis not present

## 2015-06-13 DIAGNOSIS — M1711 Unilateral primary osteoarthritis, right knee: Secondary | ICD-10-CM | POA: Diagnosis not present

## 2015-06-13 DIAGNOSIS — J4520 Mild intermittent asthma, uncomplicated: Secondary | ICD-10-CM | POA: Diagnosis not present

## 2015-06-13 DIAGNOSIS — I10 Essential (primary) hypertension: Secondary | ICD-10-CM

## 2015-06-13 DIAGNOSIS — G4700 Insomnia, unspecified: Secondary | ICD-10-CM | POA: Diagnosis not present

## 2015-06-13 DIAGNOSIS — Z23 Encounter for immunization: Secondary | ICD-10-CM | POA: Diagnosis not present

## 2015-06-13 LAB — POCT GLYCOSYLATED HEMOGLOBIN (HGB A1C): Hemoglobin A1C: 7

## 2015-06-13 MED ORDER — LORAZEPAM 0.5 MG PO TABS: 0.5000 mg | ORAL_TABLET | Freq: Every evening | ORAL | Status: DC | PRN

## 2015-06-13 MED ORDER — LOSARTAN POTASSIUM 50 MG PO TABS: 50.0000 mg | ORAL_TABLET | Freq: Every day | ORAL | Status: DC

## 2015-06-13 MED ORDER — ZOLPIDEM TARTRATE 5 MG PO TABS: 10.0000 mg | ORAL_TABLET | Freq: Every day | ORAL | Status: DC

## 2015-06-13 MED ORDER — VALACYCLOVIR HCL 500 MG PO TABS: 500.0000 mg | ORAL_TABLET | Freq: Every day | ORAL | Status: DC

## 2015-06-13 MED ORDER — TENNIS ELBOW NEOPRENE BRACE MISC: 1.0000 [IU] | Freq: Every day | Status: DC

## 2015-06-13 MED ORDER — CO Q 10 100 MG PO CAPS: 1.0000 | ORAL_CAPSULE | Freq: Every day | ORAL | Status: DC

## 2015-06-13 NOTE — Patient Instructions (Signed)
Medial Epicondylitis With Rehab Medial epicondylitis involves inflammation and pain around the inner (medial) portion of the elbow. This pain is caused by inflammation of the tendons in the forearm that flex (bring down) the wrist. Medial epicondylitis is also called golfer's elbow, because it is common among golfers. However, it may occur in any individual who flexes the wrist regularly. If medial epicondylitis is left untreated, it may become a chronic problem. SYMPTOMS   Pain, tenderness, or inflammation over the inner (medial) side of the elbow.  Pain or weakness with gripping activities.  Pain that increases with wrist twisting motions (using a screwdriver, playing golf, bowling). CAUSES  Medial epicondylitis is caused by inflammation of the tendons that flex the wrist. Causes of injury may include:  Chronic, repetitive stress and strain to the tendons that run from the wrist and forearm to the elbow.  Sudden strain on the forearm, including wrist snap when serving balls with racquet sports, or throwing a baseball. RISK INCREASES WITH:  Sports or occupations that require repetitive and/or strenuous forearm and wrist movements (pitching a baseball, golfing, carpentry).  Poor wrist and forearm strength and flexibility.  Failure to warm up properly before activity.  Resuming activity before healing, rehabilitation, and conditioning are complete. PREVENTION   Warm up and stretch properly before activity.  Maintain physical fitness:  Strength, flexibility, and endurance.  Cardiovascular fitness.  Wear and use properly fitted equipment.  Learn and use proper technique and have a coach correct improper technique.  Wear a tennis elbow (counterforce) brace. PROGNOSIS  The course of this condition depends on the degree of the injury. If treated properly, acute cases (symptoms lasting less than 4 weeks) are often resolved in 2 to 6 weeks. Chronic (longer lasting cases) often resolve  in 3 to 6 months, but may require physical therapy. RELATED COMPLICATIONS   Frequently recurring symptoms, resulting in a chronic problem. Properly treating the problem the first time decreases frequency of recurrence.  Chronic inflammation, scarring, and partial tendon tear, requiring surgery.  Delayed healing or resolution of symptoms. TREATMENT  Treatment first involves the use of ice and medicine, to reduce pain and inflammation. Strengthening and stretching exercises may reduce discomfort, if performed regularly. These exercises may be performed at home, if the condition is an acute injury. Chronic cases may require a referral to a physical therapist for evaluation and treatment. Your caregiver may advise a corticosteroid injection to help reduce inflammation. Rarely, surgery is needed. MEDICATION  If pain medicine is needed, nonsteroidal anti-inflammatory medicines (aspirin and ibuprofen), or other minor pain relievers (acetaminophen), are often advised.  Do not take pain medicine for 7 days before surgery.  Prescription pain relievers may be given, if your caregiver thinks they are needed. Use only as directed and only as much as you need.  Corticosteroid injections may be recommended. These injections should be reserved only for the most severe cases, because they can only be given a certain number of times. HEAT AND COLD  Cold treatment (icing) should be applied for 10 to 15 minutes every 2 to 3 hours for inflammation and pain, and immediately after activity that aggravates your symptoms. Use ice packs or an ice massage.  Heat treatment may be used before performing stretching and strengthening activities prescribed by your caregiver, physical therapist, or athletic trainer. Use a heat pack or a warm water soak. SEEK MEDICAL CARE IF: Symptoms get worse or do not improve in 2 weeks, despite treatment. EXERCISES  RANGE OF MOTION (ROM) AND   STRETCHING EXERCISES - Epicondylitis, Medial  (Golfer's Elbow) These exercises may help you when beginning to rehabilitate your injury. Your symptoms may go away with or without further involvement from your physician, physical therapist or athletic trainer. While completing these exercises, remember:   Restoring tissue flexibility helps normal motion to return to the joints. This allows healthier, less painful movement and activity.  An effective stretch should be held for at least 30 seconds.  A stretch should never be painful. You should only feel a gentle lengthening or release in the stretched tissue. RANGE OF MOTION - Wrist Flexion, Active-Assisted  Extend your right / left elbow with your fingers pointing down.*  Gently pull the back of your hand towards you, until you feel a gentle stretch on the top of your forearm.  Hold this position for __________ seconds. Repeat __________ times. Complete this exercise __________ times per day.  *If directed by your physician, physical therapist or athletic trainer, complete this stretch with your elbow bent, rather than extended. RANGE OF MOTION - Wrist Extension, Active-Assisted  Extend your right / left elbow and turn your palm upwards.*  Gently pull your palm and fingertips back, so your wrist extends and your fingers point more toward the ground.  You should feel a gentle stretch on the inside of your forearm.  Hold this position for __________ seconds. Repeat __________ times. Complete this exercise __________ times per day. *If directed by your physician, physical therapist or athletic trainer, complete this stretch with your elbow bent, rather than extended. STRETCH - Wrist Extension   Place your right / left fingertips on a tabletop leaving your elbow slightly bent. Your fingers should point backwards.  Gently press your fingers and palm down onto the table, by straightening your elbow. You should feel a stretch on the inside of your forearm.  Hold this position for  __________ seconds. Repeat __________ times. Complete this stretch __________ times per day.  STRENGTHENING EXERCISES - Epicondylitis, Medial (Golfer's Elbow) These exercises may help you when beginning to rehabilitate your injury. They may resolve your symptoms with or without further involvement from your physician, physical therapist or athletic trainer. While completing these exercises, remember:   Muscles can gain both the endurance and the strength needed for everyday activities through controlled exercises.  Complete these exercises as instructed by your physician, physical therapist or athletic trainer. Increase the resistance and repetitions only as guided.  You may experience muscle soreness or fatigue, but the pain or discomfort you are trying to eliminate should never worsen during these exercises. If this pain does get worse, stop and make sure you are following the directions exactly. If the pain is still present after adjustments, discontinue the exercise until you can discuss the trouble with your caregiver. STRENGTH - Wrist Flexors  Sit with your right / left forearm palm-up, and fully supported on a table or countertop. Your elbow should be resting below the height of your shoulder. Allow your wrist to extend over the edge of the surface.  Loosely holding a __________ weight, or a piece of rubber exercise band or tubing, slowly curl your hand up toward your forearm.  Hold this position for __________ seconds. Slowly lower the wrist back to the starting position in a controlled manner. Repeat __________ times. Complete this exercise __________ times per day.  STRENGTH - Wrist Extensors  Sit with your right / left forearm palm-down and fully supported. Your elbow should be resting below the height of your shoulder. Allow your   wrist to extend over the edge of the surface.  Loosely holding a __________ weight, or a piece of rubber exercise band or tubing, slowly curl your hand up  toward your forearm.  Hold this position for __________ seconds. Slowly lower the wrist back to the starting position in a controlled manner. Repeat __________ times. Complete this exercise __________ times per day.  STRENGTH - Ulnar Deviators  Stand with a ____________________ weight in your right / left hand, or sit while holding a rubber exercise band or tubing, with your healthy arm supported on a table or countertop.  Move your wrist so that your pinkie travels toward your forearm and your thumb moves away from your forearm.  Hold this position for __________ seconds and then slowly lower the wrist back to the starting position. Repeat __________ times. Complete this exercise __________ times per day STRENGTH - Grip   Grasp a tennis ball, a dense sponge, or a large, rolled sock in your hand.  Squeeze as hard as you can, without increasing any pain.  Hold this position for __________ seconds. Release your grip slowly. Repeat __________ times. Complete this exercise __________ times per day.  STRENGTH - Forearm Supinators   Sit with your right / left forearm supported on a table, keeping your elbow below shoulder height. Rest your hand over the edge, palm down.  Gently grip a hammer or a soup ladle.  Without moving your elbow, slowly turn your palm and hand upward to a "thumbs-up" position.  Hold this position for __________ seconds. Slowly return to the starting position. Repeat __________ times. Complete this exercise __________ times per day.  STRENGTH - Forearm Pronators  Sit with your right / left forearm supported on a table, keeping your elbow below shoulder height. Rest your hand over the edge, palm up.  Gently grip a hammer or a soup ladle.  Without moving your elbow, slowly turn your palm and hand upward to a "thumbs-up" position.  Hold this position for __________ seconds. Slowly return to the starting position. Repeat __________ times. Complete this exercise  __________ times per day.    This information is not intended to replace advice given to you by your health care provider. Make sure you discuss any questions you have with your health care provider.   Document Released: 05/27/2005 Document Revised: 06/17/2014 Document Reviewed: 09/08/2008 [employer] Interactive Patient Education 2016 [employer]  

## 2015-06-13 NOTE — Progress Notes (Signed)
Name: Dawn Werner   MRN: [id number]    DOB: 08/24/1950   Date:06/13/2015       Progress Note  Subjective  Chief Complaint  Chief Complaint  Patient presents with  . Medication Refill    3 month F/U  . Diabetes    patient check is twice a day. lowest 120, averages 160,  highest 165. cannot tolerate Metformin (gave her diarrhea)  . Hypertension  . Asthma  . Insomnia    well controlled with meds, sleeps about 6 hrs, trouble staying asleep.  Marland Kitchen Hyperlipidemia    cannot tolerate meds (gave her leg cramps)  . Elbow Pain    started a coulple of months. no known injury. very sore    HPI  Asthma Mild intermittent: doing well on prn medication  06/13/15 1118  Asthma History  Symptoms 0-2 days/week  Nighttime Awakenings 0-2/month  Asthma interference with normal activity No limitations  SABA use (not for EIB) 0-2 days/wk  Risk: Exacerbations requiring oral systemic steroids 0-1 / year  Asthma Severity Intermittent    Right medial epicondylitis: she states she has noticed pain on right medial elbow over the past couple of months, no swelling, no redness, pain with movement, pain right now is 4/10 with manipulation, and a 2 during rest.   Hyperlipidemia: she stopped taking Crestor because of severe leg cramps, aslo tried Atorvastatin and Pravastatin, advised to try taking just half of crestor ( she still has at home ) and add Co-Q 10 daily to see if she can tolerate medication  Insomnia: she is trying to wean self off Ambien, and is currently taking about 3/4 of the 10 mg tablet. Wiling to try taking 5 mg and continue alprazolam prn if she can't fall asleep  HTN: doing well, no chest pain, no palpitation, no chest pain, taking medication as prescribed  CAD: no episodes, s/p history of MI, taking aspirin daily, beta-blocker and needs to resume statin therapy. No chest pain, no decrease in exercise tolerance.   DM: history of proteinuria, taking ARB, off Metformin because of  diarrhea and nausea, she has not been compliant with diet over the holidays and hgbA1C has gone up to 7. However she will resume diet and exercise. Denies polyphagia, polydipsia or polyuria. \  OA: using topical medication, seen by Ortho, doing better now.   Patient Active Problem List   Diagnosis Date Noted  . 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 Primary osteoarthritis of knee 01/24/2015  . Asthma, mild intermittent 11/18/2014  . Arteriosclerosis of coronary artery 11/18/2014  . Insomnia, persistent 11/18/2014  . Dyslipidemia 11/18/2014  . Acid reflux 11/18/2014  . Diabetes type 2, controlled (HCC) 11/18/2014  . Genital herpes in women 11/18/2014  . Hypertension 11/18/2014  . Asymptomatic postmenopausal status 11/18/2014  . History of acute myocardial infarction 11/18/2014    Past Surgical History  Procedure Laterality Date  . Tubal ligation    . Abdominal hysterectomy    . Breast surgery      breast reduction    Family History  Problem Relation Age of Onset  . Hyperlipidemia Mother   . Hypertension Mother   . Stroke Mother   . Dementia Father   . Cancer Sister     breast  . Asthma Brother   . Depression Brother     bipolar  . Heart disease Brother   . Heart disease Brother     Social History   Social History  . Marital Status: Married    Spouse  Name: N/A  . Number of Children: N/A  . Years of Education: N/A   Occupational History  . Not on file.   Social History Main Topics  . Smoking status: Never Smoker   . Smokeless tobacco: Never Used  . Alcohol Use: No  . Drug Use: No  . Sexual Activity:    Partners: Male   Other Topics Concern  . Not on file   Social History Narrative     Current outpatient prescriptions:  .  albuterol (PROVENTIL HFA) 108 (90 BASE) MCG/ACT inhaler, Inhale 2 puffs into the lungs as needed., Disp: , Rfl:  .  aspirin EC 81 MG tablet, Take 1 tablet by mouth daily., Disp: , Rfl:  .  carvedilol (COREG) 6.25 MG tablet, TAKE 1 TABLET TWICE A  DAY, Disp: 180 tablet, Rfl: 1 .  LORazepam (ATIVAN) 0.5 MG tablet, Take 1 tablet (0.5 mg total) by mouth at bedtime as needed. for sleep, Disp: 30 tablet, Rfl: 2 .  losartan (COZAAR) 50 MG tablet, Take 1 tablet (50 mg total) by mouth daily., Disp: 90 tablet, Rfl: 1 .  valACYclovir (VALTREX) 500 MG tablet, Take 1 tablet (500 mg total) by mouth daily., Disp: 90 tablet, Rfl: 4 .  zolpidem (AMBIEN) 5 MG tablet, Take 2 tablets (10 mg total) by mouth at bedtime., Disp: 90 tablet, Rfl: 0 .  acyclovir ointment (ZOVIRAX) 5 %, Apply 1 application topically 2 (two) times daily as needed. Reported on 06/13/2015, Disp: , Rfl:  .  Coenzyme Q10 (CO Q 10) 100 MG CAPS, Take 1 capsule by mouth daily., Disp: 30 capsule, Rfl: 0 .  colchicine 0.6 MG tablet, Take 1 tablet (0.6 mg total) by mouth daily. Take two at onset of gout and repeat in 1 hour, prn (Patient not taking: Reported on 06/13/2015), Disp: 30 tablet, Rfl: 0 .  omeprazole (PRILOSEC) 20 MG capsule, Take 1 capsule by mouth as needed., Disp: , Rfl:  .  rosuvastatin (CRESTOR) 40 MG tablet, Take 1 tablet (40 mg total) by mouth daily. (Patient not taking: Reported on 06/13/2015), Disp: 90 tablet, Rfl: 1  Allergies  Allergen Reactions  . Metformin And Related Diarrhea    And nausea     ROS  Constitutional: Negative for fever or weight change.  Respiratory: Negative for cough and shortness of breath.   Cardiovascular: Negative for chest pain or palpitations.  Gastrointestinal: Negative for abdominal pain, no bowel changes.  Musculoskeletal: Negative for gait problem or joint swelling.  Skin: Negative for rash.  Neurological: Negative for dizziness or headache.  No other specific complaints in a complete review of systems (except as listed in HPI above).  Objective  Filed Vitals:   06/13/15 1049  BP: 116/68  Pulse: 84  Temp: 98.2 F (36.8 C)  TempSrc: Oral  Resp: 18  Height: 5\' 3"  (1.6 m)  Weight: 167 lb 8 oz (75.978 kg)  SpO2: 97%    Body mass  index is 29.68 kg/(m^2).  Physical Exam  Constitutional: Patient appears well-developed and well-nourished. Obese No distress.  HEENT: head atraumatic, normocephalic, pupils equal and reactive to light,  neck supple, throat within normal limits Cardiovascular: Normal rate, regular rhythm and normal heart sounds.  No murmur heard. No BLE edema. Pulmonary/Chest: Effort normal and breath sounds normal. No respiratory distress. Abdominal: Soft.  There is no tenderness. Psychiatric: Patient has a normal mood and affect. behavior is normal. Judgment and thought content normal. Muscular Skeletal: pain during palpation of medial epicondyle of right arm, and  with flexion of arm against resistance  Recent Results (from the past 2160 hour(s))  POCT HgB A1C     Status: Abnormal   Collection Time: 06/13/15 11:03 AM  Result Value Ref Range   Hemoglobin A1C 7.0     PHQ2/9: Depression screen [facility] 2/9 06/13/2015 01/24/2015 11/18/2014 11/18/2014  Decreased Interest 0 0 0 -  Down, Depressed, Hopeless 0 0 0 0  PHQ - 2 Score 0 0 0 0     Fall Risk: Fall Risk  06/13/2015 01/24/2015 11/18/2014 11/18/2014  Falls in the past year? No No No No     Functional Status Survey: Is the patient deaf or have difficulty hearing?: No Does the patient have difficulty seeing, even when wearing glasses/contacts?: No Does the patient have difficulty concentrating, remembering, or making decisions?: No Does the patient have difficulty walking or climbing stairs?: No Does the patient have difficulty dressing or bathing?: No Does the patient have difficulty doing errands alone such as visiting a doctor's office or shopping?: No    Assessment & Plan  1. Controlled type 2 diabetes mellitus with diabetic nephropathy, without long-term current use of insulin (HCC)  - POCT HgB A1C - losartan (COZAAR) 50 MG tablet; Take 1 tablet (50 mg total) by mouth daily.  Dispense: 90 tablet; Refill: 1  2. Insomnia, persistent  -  zolpidem (AMBIEN) 5 MG tablet; Take 2 tablets (10 mg total) by mouth at bedtime.  Dispense: 90 tablet; Refill: 0 - LORazepam (ATIVAN) 0.5 MG tablet; Take 1 tablet (0.5 mg total) by mouth at bedtime as needed. for sleep  Dispense: 30 tablet; Refill: 2  3. Primary osteoarthritis of knee, right  Doing well at this time  4. Asthma, mild intermittent, uncomplicated  Continue prn medication   5. Essential hypertension  - losartan (COZAAR) 50 MG tablet; Take 1 tablet (50 mg total) by mouth daily.  Dispense: 90 tablet; Refill: 1  6. History of acute myocardial infarction  Continue medication, resume statin therapy   7. Dyslipidemia  Needs to resume statin therapy   8. Genital herpes in women  - valACYclovir (VALTREX) 500 MG tablet; Take 1 tablet (500 mg total) by mouth daily.  Dispense: 90 tablet; Refill: 4  9. Need for shingles vaccine  - Varicella-zoster vaccine subcutaneous - refused

## 2015-07-18 ENCOUNTER — Other Ambulatory Visit: Payer: Self-pay | Admitting: Family Medicine

## 2015-08-28 DIAGNOSIS — H2513 Age-related nuclear cataract, bilateral: Secondary | ICD-10-CM | POA: Diagnosis not present

## 2015-08-29 LAB — HM DIABETES EYE EXAM

## 2015-09-11 ENCOUNTER — Ambulatory Visit (INDEPENDENT_AMBULATORY_CARE_PROVIDER_SITE_OTHER): Payer: PPO | Admitting: Family Medicine

## 2015-09-11 ENCOUNTER — Encounter: Payer: Self-pay | Admitting: Family Medicine

## 2015-09-11 VITALS — BP 112/68 | HR 75 | Temp 98.0°F | Resp 16 | Ht 63.0 in | Wt 165.6 lb

## 2015-09-11 DIAGNOSIS — E785 Hyperlipidemia, unspecified: Secondary | ICD-10-CM

## 2015-09-11 DIAGNOSIS — E1121 Type 2 diabetes mellitus with diabetic nephropathy: Secondary | ICD-10-CM

## 2015-09-11 DIAGNOSIS — I252 Old myocardial infarction: Secondary | ICD-10-CM

## 2015-09-11 DIAGNOSIS — G4700 Insomnia, unspecified: Secondary | ICD-10-CM | POA: Diagnosis not present

## 2015-09-11 DIAGNOSIS — I2510 Atherosclerotic heart disease of native coronary artery without angina pectoris: Secondary | ICD-10-CM

## 2015-09-11 DIAGNOSIS — I10 Essential (primary) hypertension: Secondary | ICD-10-CM | POA: Diagnosis not present

## 2015-09-11 DIAGNOSIS — J4520 Mild intermittent asthma, uncomplicated: Secondary | ICD-10-CM

## 2015-09-11 LAB — POCT UA - MICROALBUMIN: Microalbumin Ur, POC: NEGATIVE mg/L

## 2015-09-11 LAB — POCT GLYCOSYLATED HEMOGLOBIN (HGB A1C): Hemoglobin A1C: 6.6

## 2015-09-11 MED ORDER — ZOLPIDEM TARTRATE 10 MG PO TABS: 10.0000 mg | ORAL_TABLET | Freq: Every day | ORAL | Status: DC

## 2015-09-11 MED ORDER — CARVEDILOL 6.25 MG PO TABS: 6.2500 mg | ORAL_TABLET | Freq: Two times a day (BID) | ORAL | Status: DC

## 2015-09-11 NOTE — Progress Notes (Signed)
Name: Dawn Werner   MRN: [id number]    DOB: Oct 08, 1950   Date:09/11/2015       Progress Note  Subjective  Chief Complaint  Chief Complaint  Patient presents with  . Diabetes  . Insomnia  . Osteoarthritis    right knee  . Asthma  . Hypertension  . Dyslipidemia  . Medication Management    patient needs refills and wants to talk to Dr. Carlynn Purl about Ambien (pharmacists rec the )    HPI  Hyperlipidemia: she stopped taking Crestor because of severe leg cramps, aslo tried Atorvastatin and Pravastatin, advised to try taking just half of crestor ( she still has at home ) and add Co-Q 10 daily on her last visit but she is still not taking it as directed.   Insomnia: she was trying to wean self off of Ambien, and we switched to lower dose on her last visit, but still taking 1.5 to 2 pills daily and is more expensive than the 10 mg tablets, therefore she would like to go back on 10 mg at this time  HTN: doing well, no chest pain, no palpitation, no chest pain, taking medication as prescribed  CAD: no episodes, s/p history of MI, taking aspirin daily, beta-blocker and needs to resume statin therapy. No chest pain, no decrease in exercise tolerance, walking 30-45 minutes daily without discomfort.    DM: history of proteinuria, taking ARB, off Metformin because of diarrhea and nausea, only on diet, stopped sodas, sweet tea and desserts since March 1st, 2017 and HgbA1C is down to 6.6%.  Denies polyphagia, polydipsia or polyuria.   Asthma Mild Intermittent: she states that over the past two weeks, allergies has kicked in and she has a mild cough in the evenings, she does not want to add medications, discussed singulair and allergies medications. She denies wheezing or SOB  Patient Active Problem List   Diagnosis Date Noted  . 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 Primary osteoarthritis of knee 01/24/2015  . Asthma, mild intermittent 11/18/2014  . Arteriosclerosis of coronary artery 11/18/2014  .  Insomnia, persistent 11/18/2014  . Dyslipidemia 11/18/2014  . Acid reflux 11/18/2014  . Diabetes type 2, controlled (HCC) 11/18/2014  . Genital herpes in women 11/18/2014  . Hypertension 11/18/2014  . Asymptomatic postmenopausal status 11/18/2014  . History of acute myocardial infarction 11/18/2014    Past Surgical History  Procedure Laterality Date  . Tubal ligation    . Abdominal hysterectomy    . Breast surgery      breast reduction    Family History  Problem Relation Age of Onset  . Hyperlipidemia Mother   . Hypertension Mother   . Stroke Mother   . Dementia Father   . Cancer Sister     breast  . Asthma Brother   . Depression Brother     bipolar  . Heart disease Brother   . Heart disease Brother     Social History   Social History  . Marital Status: Married    Spouse Name: N/A  . Number of Children: N/A  . Years of Education: N/A   Occupational History  . Not on file.   Social History Main Topics  . Smoking status: Never Smoker   . Smokeless tobacco: Never Used  . Alcohol Use: No  . Drug Use: No  . Sexual Activity:    Partners: Male   Other Topics Concern  . Not on file   Social History Narrative     Current outpatient  prescriptions:  .  acyclovir ointment (ZOVIRAX) 5 %, Apply 1 application topically 2 (two) times daily as needed. Reported on 06/13/2015, Disp: , Rfl:  .  albuterol (PROVENTIL HFA) 108 (90 BASE) MCG/ACT inhaler, Inhale 2 puffs into the lungs as needed., Disp: , Rfl:  .  aspirin EC 81 MG tablet, Take 1 tablet by mouth daily., Disp: , Rfl:  .  carvedilol (COREG) 6.25 MG tablet, Take 1 tablet (6.25 mg total) by mouth 2 (two) times daily., Disp: 180 tablet, Rfl: 1 .  Coenzyme Q10 (CO Q 10) 100 MG CAPS, Take 1 capsule by mouth daily., Disp: 30 capsule, Rfl: 0 .  colchicine 0.6 MG tablet, Take 1 tablet (0.6 mg total) by mouth daily. Take two at onset of gout and repeat in 1 hour, prn (Patient not taking: Reported on 06/13/2015), Disp: 30  tablet, Rfl: 0 .  Elastic Bandages & Supports (TENNIS ELBOW NEOPRENE BRACE) MISC, 1 Units by Does not apply route daily., Disp: 1 each, Rfl: 0 .  LORazepam (ATIVAN) 0.5 MG tablet, Take 1 tablet (0.5 mg total) by mouth at bedtime as needed. for sleep, Disp: 30 tablet, Rfl: 2 .  losartan (COZAAR) 50 MG tablet, Take 1 tablet (50 mg total) by mouth daily., Disp: 90 tablet, Rfl: 1 .  omeprazole (PRILOSEC) 20 MG capsule, Take 1 capsule by mouth as needed., Disp: , Rfl:  .  rosuvastatin (CRESTOR) 40 MG tablet, Take 1 tablet (40 mg total) by mouth daily. (Patient not taking: Reported on 06/13/2015), Disp: 90 tablet, Rfl: 1 .  valACYclovir (VALTREX) 500 MG tablet, Take 1 tablet (500 mg total) by mouth daily., Disp: 90 tablet, Rfl: 4 .  zolpidem (AMBIEN) 10 MG tablet, Take 1 tablet (10 mg total) by mouth at bedtime., Disp: 90 tablet, Rfl: 0  Allergies  Allergen Reactions  . Metformin And Related Diarrhea    And nausea     ROS  Constitutional: Negative for fever or weight change.  Respiratory: Positive for cough and shortness of breath.   Cardiovascular: Negative for chest pain or palpitations.  Gastrointestinal: Negative for abdominal pain, no bowel changes.  Musculoskeletal: Negative for gait problem or joint swelling.  Skin: Negative for rash.  Neurological: Negative for dizziness or headache.  No other specific complaints in a complete review of systems (except as listed in HPI above).  Objective  Filed Vitals:   09/11/15 1018  BP: 112/68  Pulse: 75  Temp: 98 F (36.7 C)  TempSrc: Oral  Resp: 16  Height:  (1.6 m)  Weight: 165 lb 9.6 oz (75.116 kg)  SpO2: 97%    Body mass index is 29.34 kg/(m^2).  Physical Exam  Constitutional: Patient appears well-developed and well-nourished. Obese No distress.  HEENT: head atraumatic, normocephalic, pupils equal and reactive to light,  neck supple, throat within normal limits Cardiovascular: Normal rate, regular rhythm and normal heart  sounds.  No murmur heard. No BLE edema. Pulmonary/Chest: Effort normal and breath sounds normal. No respiratory distress. Abdominal: Soft.  There is no tenderness. Psychiatric: Patient has a normal mood and affect. behavior is normal. Judgment and thought content normal.  Recent Results (from the past 2160 hour(s))  POCT HgB A1C     Status: Abnormal   Collection Time: 06/13/15 11:03 AM  Result Value Ref Range   Hemoglobin A1C 7.0   POCT glycosylated hemoglobin (Hb A1C)     Status: Abnormal   Collection Time: 09/11/15 10:25 AM  Result Value Ref Range   Hemoglobin A1C  6.6   POCT UA - Microalbumin     Status: Normal   Collection Time: 09/11/15 10:26 AM  Result Value Ref Range   Microalbumin Ur, POC NEGATIVE mg/L   Creatinine, POC  mg/dL   Albumin/Creatinine Ratio, Urine, POC       PHQ2/9: [facility] 09/11/2015 06/13/2015 01/24/2015 11/18/2014 11/18/2014  Decreased Interest 0 0 0 0 -  Down, Depressed, Hopeless 0 0 0 0 0  PHQ - 2 Score 0 0 0 0 0     Fall Risk: Fall Risk  09/11/2015 06/13/2015 01/24/2015 11/18/2014 11/18/2014  Falls in the past year? No No No No No     Functional Status Survey: Is the patient deaf or have difficulty hearing?: No Does the patient have difficulty seeing, even when wearing glasses/contacts?: No Does the patient have difficulty concentrating, remembering, or making decisions?: No Does the patient have difficulty walking or climbing stairs?: No Does the patient have difficulty dressing or bathing?: No Does the patient have difficulty doing errands alone such as visiting a doctor's office or shopping?: No   Assessment & Plan  1. Controlled type 2 diabetes mellitus with diabetic nephropathy, without long-term current use of insulin (HCC)  Doing well on life style modification  - POCT glycosylated hemoglobin (Hb A1C) - POCT UA - Microalbumin  2. Asthma, mild intermittent, uncomplicated  Continue prn medication   3. Essential  hypertension  - carvedilol (COREG) 6.25 MG tablet; Take 1 tablet (6.25 mg total) by mouth 2 (two) times daily.  Dispense: 180 tablet; Refill: 1  4. Insomnia, persistent  - zolpidem (AMBIEN) 10 MG tablet; Take 1 tablet (10 mg total) by mouth at bedtime.  Dispense: 90 tablet; Refill: 0  5. Dyslipidemia  Discussed importance of taking Crestor - if not tolerated with CoQ10 we can try Livalo  6. Arteriosclerosis of coronary artery  On aspirin, beta-blocker, but needs to resume statin therapy   7. History of acute myocardial infarction  Doing well, but needs to resume statin therapy

## 2015-12-11 ENCOUNTER — Encounter: Payer: Self-pay | Admitting: *Deleted

## 2015-12-11 ENCOUNTER — Ambulatory Visit
Admission: EM | Admit: 2015-12-11 | Discharge: 2015-12-11 | Disposition: A | Discharge status: Home / Self Care | Payer: PPO | Attending: Family Medicine | Admitting: Family Medicine

## 2015-12-11 ENCOUNTER — Other Ambulatory Visit: Payer: Self-pay | Admitting: Family Medicine

## 2015-12-11 DIAGNOSIS — N390 Urinary tract infection, site not specified: Secondary | ICD-10-CM | POA: Diagnosis not present

## 2015-12-11 LAB — URINALYSIS COMPLETE WITH MICROSCOPIC (ARMC ONLY)
Bilirubin Urine: NEGATIVE
Glucose, UA: NEGATIVE mg/dL
Hgb urine dipstick: NEGATIVE
Ketones, ur: NEGATIVE mg/dL
Leukocytes, UA: NEGATIVE
Nitrite: NEGATIVE
Specific Gravity, Urine: 1.025 (ref 1.005–1.030)
pH: 5.5 (ref 5.0–8.0)

## 2015-12-11 MED ORDER — SULFAMETHOXAZOLE-TRIMETHOPRIM 800-160 MG PO TABS: 1.0000 | ORAL_TABLET | Freq: Two times a day (BID) | ORAL | Status: AC

## 2015-12-11 NOTE — Discharge Instructions (Signed)
Start Bactrim (antibiotic) as directed and take for 7 days. Increase fluids. Follow-up with your primary care provider within 3 days if not improving. Urinary Tract Infection Urinary tract infections (UTIs) can develop anywhere along your urinary tract. Your urinary tract is your body's drainage system for removing wastes and extra water. Your urinary tract includes two kidneys, two ureters, a bladder, and a urethra. Your kidneys are a pair of bean-shaped organs. Each kidney is about the size of your fist. They are located below your ribs, one on each side of your spine. CAUSES Infections are caused by microbes, which are microscopic organisms, including fungi, viruses, and bacteria. These organisms are so small that they can only be seen through a microscope. Bacteria are the microbes that most commonly cause UTIs. SYMPTOMS  Symptoms of UTIs may vary by age and gender of the patient and by the location of the infection. Symptoms in young women typically include a frequent and intense urge to urinate and a painful, burning feeling in the bladder or urethra during urination. Older women and men are more likely to be tired, shaky, and weak and have muscle aches and abdominal pain. A fever may mean the infection is in your kidneys. Other symptoms of a kidney infection include pain in your back or sides below the ribs, nausea, and vomiting. DIAGNOSIS To diagnose a UTI, your caregiver will ask you about your symptoms. Your caregiver will also ask you to provide a urine sample. The urine sample will be tested for bacteria and white blood cells. White blood cells are made by your body to help fight infection. TREATMENT  Typically, UTIs can be treated with medication. Because most UTIs are caused by a bacterial infection, they usually can be treated with the use of antibiotics. The choice of antibiotic and length of treatment depend on your symptoms and the type of bacteria causing your infection. HOME CARE  INSTRUCTIONS  If you were prescribed antibiotics, take them exactly as your caregiver instructs you. Finish the medication even if you feel better after you have only taken some of the medication.  Drink enough water and fluids to keep your urine clear or pale yellow.  Avoid caffeine, tea, and carbonated beverages. They tend to irritate your bladder.  Empty your bladder often. Avoid holding urine for long periods of time.  Empty your bladder before and after sexual intercourse.  After a bowel movement, women should cleanse from front to back. Use each tissue only once. SEEK MEDICAL CARE IF:   You have back pain.  You develop a fever.  Your symptoms do not begin to resolve within 3 days. SEEK IMMEDIATE MEDICAL CARE IF:   You have severe back pain or lower abdominal pain.  You develop chills.  You have nausea or vomiting.  You have continued burning or discomfort with urination. MAKE SURE YOU:   Understand these instructions.  Will watch your condition.  Will get help right away if you are not doing well or get worse.   This information is not intended to replace advice given to you by your health care provider. Make sure you discuss any questions you have with your health care provider.   Document Released: 03/06/2005 Document Revised: 02/15/2015 Document Reviewed: 07/05/2011 [employer] Interactive Patient Education [employer]2016 [employer]

## 2015-12-11 NOTE — ED Provider Notes (Signed)
CSN: [id number]     Arrival date & time 12/11/15  0903 History   First MD Initiated Contact with Patient 12/11/15 [phone]     Chief Complaint  Patient presents with  . Flank Pain  . Back Pain  . Abdominal Pain   (Consider location/radiation/quality/duration/timing/severity/associated sxs/prior Treatment) HPI Comments: Patient presents with lower abdominal pain and flank pain for 5 days. No distinct dysuria but does has lower abdominal pressure. Denies any fever. No unusual discharge. Has noticed strong odor of urine. Patient had Amoxicillin from a previous infection which she took for 2 days but stopped since she ran out of medication. The Amoxicillin seemed to help over the weekend but now her symptoms have returned. Previous UTI a year or more ago.   Patient is a 65 y.o. female presenting with flank pain, back pain, and abdominal pain. The history is provided by the patient.  Flank Pain This is a new problem. The problem occurs constantly. The problem has not changed since onset.Associated symptoms include abdominal pain. The treatment provided no relief.  Back Pain Associated symptoms: abdominal pain   Associated symptoms: no fever   Abdominal Pain Associated symptoms: fatigue   Associated symptoms: no fever     Past Medical History  Diagnosis Date  . Asthma   . Hyperlipidemia   . Diabetes mellitus without complication (HCC)   . GERD (gastroesophageal reflux disease)   . Insomnia   . Anxiety    Past Surgical History  Procedure Laterality Date  . Tubal ligation    . Abdominal hysterectomy    . Breast surgery      breast reduction   Family History  Problem Relation Age of Onset  . Hyperlipidemia Mother   . Hypertension Mother   . Stroke Mother   . Dementia Father   . Cancer Sister     breast  . Asthma Brother   . Depression Brother     bipolar  . Heart disease Brother   . Heart disease Brother    Social History  Substance Use Topics  . Smoking status: Never Smoker    . Smokeless tobacco: Never Used  . Alcohol Use: No   OB History    No data available     Review of Systems  Constitutional: Positive for fatigue. Negative for fever.  Gastrointestinal: Positive for abdominal pain.  Genitourinary: Positive for flank pain.  Musculoskeletal: Positive for back pain.    Allergies  Metformin and related  Home Medications   Prior to Admission medications   Medication Sig Start Date End Date Taking? Authorizing Provider  aspirin EC 81 MG tablet Take 1 tablet by mouth daily.   Yes Historical Provider, MD  carvedilol (COREG) 6.25 MG tablet Take 1 tablet (6.25 mg total) by mouth 2 (two) times daily. 09/11/15  Yes Alba CoryKrichna Sowles, MD  losartan (COZAAR) 50 MG tablet Take 1 tablet (50 mg total) by mouth daily. 06/13/15  Yes Alba CoryKrichna Sowles, MD  omeprazole (PRILOSEC) 20 MG capsule Take 1 capsule by mouth as needed.   Yes Historical Provider, MD  rosuvastatin (CRESTOR) 40 MG tablet Take 1 tablet (40 mg total) by mouth daily. 11/21/14  Yes Alba CoryKrichna Sowles, MD  valACYclovir (VALTREX) 500 MG tablet Take 1 tablet (500 mg total) by mouth daily. 06/13/15  Yes Alba CoryKrichna Sowles, MD  zolpidem (AMBIEN) 10 MG tablet Take 1 tablet (10 mg total) by mouth at bedtime. 09/11/15  Yes Alba CoryKrichna Sowles, MD  acyclovir ointment (ZOVIRAX) 5 % Apply 1 application topically 2 (  two) times daily as needed. Reported on 06/13/2015 04/28/14   Historical Provider, MD  albuterol (PROVENTIL HFA) 108 (90 BASE) MCG/ACT inhaler Inhale 2 puffs into the lungs as needed.    Historical Provider, MD  Coenzyme Q10 (CO Q 10) 100 MG CAPS Take 1 capsule by mouth daily. 06/13/15   Alba CoryKrichna Sowles, MD  colchicine 0.6 MG tablet Take 1 tablet (0.6 mg total) by mouth daily. Take two at onset of gout and repeat in 1 hour, prn Patient not taking: Reported on 06/13/2015 01/24/15   Alba CoryKrichna Sowles, MD  Elastic Bandages & Supports (TENNIS ELBOW NEOPRENE BRACE) MISC 1 Units by Does not apply route daily. 06/13/15   Alba CoryKrichna Sowles, MD   LORazepam (ATIVAN) 0.5 MG tablet Take 1 tablet (0.5 mg total) by mouth at bedtime as needed. for sleep 06/13/15   Alba CoryKrichna Sowles, MD  sulfamethoxazole-trimethoprim (BACTRIM DS,SEPTRA DS) 800-160 MG tablet Take 1 tablet by mouth 2 (two) times daily. 12/11/15 12/18/15  Sudie GrumblingAnn Berry Amyot, NP   Meds Ordered and Administered this Visit  Medications - No data to display  BP 121/66 mmHg  Pulse 71  Temp(Src) 98 F (36.7 C) (Oral)  Resp 16  Ht 5\' 3"  (1.6 m)  Wt 165 lb (74.844 kg)  BMI 29.24 kg/m2  SpO2 100% No data found.   Physical Exam  Constitutional: She is oriented to person, place, and time. She appears well-developed and well-nourished. No distress.  Cardiovascular: Normal rate, regular rhythm and normal heart sounds.   Pulmonary/Chest: Effort normal and breath sounds normal.  Abdominal: Soft. Normal appearance and bowel sounds are normal. There is no hepatosplenomegaly. There is tenderness in the suprapubic area. There is CVA tenderness (bilateral). There is no rigidity and no guarding.  Neurological: She is alert and oriented to person, place, and time.  Skin: Skin is warm and dry.  Psychiatric: She has a normal mood and affect. Her behavior is normal.    ED Course  Procedures (including critical care time)  Labs Review Labs Reviewed  URINALYSIS COMPLETEWITH MICROSCOPIC ([facility] ONLY) - Abnormal; Notable for the following:    Color, Urine AMBER (*)    Protein, ur TRACE (*)    Bacteria, UA RARE (*)    Squamous Epithelial / LPF 6-30 (*)    All other components within normal limits  URINE CULTURE    Imaging Review No results found.   Visual Acuity Review  Right Eye Distance:   Left Eye Distance:   Bilateral Distance:    Right Eye Near:   Left Eye Near:    Bilateral Near:         MDM   1. UTI (lower urinary tract infection)    Reviewed results with patient. Due to trace of protein and some bacteria present, will treat for Urinary tract infection. Culture sent.  Recommend Bactrim DS twice a day for 7 days pending urine culture results. Encouraged to increase fluid intake. If symptoms persist, follow-up with her PCP or go to ER if symptoms worsen. Otherwise follow-up pending urine culture results.     Sudie GrumblingAnn Berry Amyot, NP 12/11/15 2231  Sudie GrumblingAnn Berry Amyot, NP 12/11/15 2232

## 2015-12-11 NOTE — ED Notes (Signed)
Bilat flank pain, suprapubic and back pain since Wednesday. Denies dysuria, and fever.

## 2015-12-11 NOTE — Telephone Encounter (Signed)
Patient requesting refill. 

## 2015-12-12 LAB — URINE CULTURE
Culture: 7000 — AB
Special Requests: NORMAL

## 2015-12-19 ENCOUNTER — Other Ambulatory Visit: Payer: Self-pay | Admitting: Family Medicine

## 2016-01-09 ENCOUNTER — Other Ambulatory Visit: Payer: Self-pay | Admitting: Family Medicine

## 2016-01-09 DIAGNOSIS — E1121 Type 2 diabetes mellitus with diabetic nephropathy: Secondary | ICD-10-CM

## 2016-01-09 DIAGNOSIS — I10 Essential (primary) hypertension: Secondary | ICD-10-CM

## 2016-01-09 NOTE — Telephone Encounter (Signed)
Patient requesting refill. 

## 2016-01-10 ENCOUNTER — Ambulatory Visit (INDEPENDENT_AMBULATORY_CARE_PROVIDER_SITE_OTHER): Payer: PPO | Admitting: Family Medicine

## 2016-01-10 ENCOUNTER — Encounter: Payer: Self-pay | Admitting: Family Medicine

## 2016-01-10 VITALS — BP 140/80 | HR 71 | Temp 99.0°F | Resp 18 | Ht 63.0 in | Wt 172.3 lb

## 2016-01-10 DIAGNOSIS — E785 Hyperlipidemia, unspecified: Secondary | ICD-10-CM | POA: Diagnosis not present

## 2016-01-10 DIAGNOSIS — M94261 Chondromalacia, right knee: Secondary | ICD-10-CM | POA: Diagnosis not present

## 2016-01-10 DIAGNOSIS — M23351 Other meniscus derangements, posterior horn of lateral meniscus, right knee: Secondary | ICD-10-CM | POA: Diagnosis not present

## 2016-01-10 DIAGNOSIS — I10 Essential (primary) hypertension: Secondary | ICD-10-CM

## 2016-01-10 DIAGNOSIS — Z23 Encounter for immunization: Secondary | ICD-10-CM

## 2016-01-10 DIAGNOSIS — J069 Acute upper respiratory infection, unspecified: Secondary | ICD-10-CM

## 2016-01-10 DIAGNOSIS — E1140 Type 2 diabetes mellitus with diabetic neuropathy, unspecified: Secondary | ICD-10-CM | POA: Diagnosis not present

## 2016-01-10 DIAGNOSIS — M1711 Unilateral primary osteoarthritis, right knee: Secondary | ICD-10-CM | POA: Diagnosis not present

## 2016-01-10 DIAGNOSIS — M109 Gout, unspecified: Secondary | ICD-10-CM

## 2016-01-10 DIAGNOSIS — J4520 Mild intermittent asthma, uncomplicated: Secondary | ICD-10-CM

## 2016-01-10 DIAGNOSIS — I252 Old myocardial infarction: Secondary | ICD-10-CM

## 2016-01-10 DIAGNOSIS — G4700 Insomnia, unspecified: Secondary | ICD-10-CM

## 2016-01-10 LAB — POCT GLYCOSYLATED HEMOGLOBIN (HGB A1C): Hemoglobin A1C: 7

## 2016-01-10 NOTE — Progress Notes (Signed)
Name: Dawn Werner   MRN: [id number]    DOB: [birth year]   Date:01/10/2016       Progress Note  Subjective  Chief Complaint  Chief Complaint  Patient presents with  . Diabetes    pt here for 4 month follow up    HPI  Hyperlipidemia: she is back on Crestor 40mg  and states no longer has muscle pain or cramps. Not taking CoQ-10 because she states she has not needed it.   Insomnia: she was trying to wean self off of Ambien, she has 10 mg tablets, and is taking about 7.5 mg and trying to go down to 5 mg qhs. She states taking one whole pill is too much for her now  HTN: usually well controlled, but has been taking otc sinus medication over the past few days.  No chest pain,  Palpitation or SOB,taking medication as prescribed  CAD: no episodes, s/p history of MI, taking aspirin daily, beta-blocker and  statin therapy. No chest pain, no decrease in exercise tolerance, walking 30-45 minutes daily without discomfort.    DM: history of proteinuria, taking ARB, off Metformin because of diarrhea and nausea, only on diet, stopped sodas, sweet tea and desserts since March 1st, 2017 and HgbA1C is down to 6.6%, hgbA1C is up again, she states she is eating desserts again - she likes to bake.  Denies polyphagia, polydipsia or polyuria.   Asthma Mild Intermittent: she has noticed a productive cough over the past week, feels like a tickle in her throat. She states she also has noticed some facial pressure and nasal congestion. She has started taking otc sinus medication, used her inhaler twice and is using otc nasal spray and is feeling better. Symptoms are resolving.   Right knee pain: she states pain is okay - 4/10, seen by [facility]. Taking Aleve prn. No longer has instability and no swelling. She has a lot of popping when going up steps. Contemplating surgery   Patient Active Problem List   Diagnosis Date Noted  . 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 Derangement of posterior  horn of lateral meniscus of right knee 01/10/2016  . 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 Primary osteoarthritis of knee 01/24/2015  . Asthma, mild intermittent 11/18/2014  . Arteriosclerosis of coronary artery 11/18/2014  . Insomnia, persistent 11/18/2014  . Dyslipidemia 11/18/2014  . Acid reflux 11/18/2014  . Diabetes type 2, controlled (HCC) 11/18/2014  . Genital herpes in women 11/18/2014  . Hypertension 11/18/2014  . Asymptomatic postmenopausal status 11/18/2014  . History of acute myocardial infarction 11/18/2014    Past Surgical History:  Procedure Laterality Date  . ABDOMINAL HYSTERECTOMY    . BREAST SURGERY     breast reduction  . TUBAL LIGATION      Family History  Problem Relation Age of Onset  . Hyperlipidemia Mother   . Hypertension Mother   . Stroke Mother   . Dementia Father   . Cancer Sister     breast  . Asthma Brother   . Depression Brother     bipolar  . Heart disease Brother   . Heart disease Brother     Social History   Social History  . Marital status: Married    Spouse name: N/A  . Number of children: N/A  . Years of education: N/A   Occupational History  . Not on file.   Social History Main Topics  . Smoking status: Never Smoker  . Smokeless tobacco: Never Used  . Alcohol  use No  . Drug use: No  . Sexual activity: Yes    Partners: Male   Other Topics Concern  . Not on file   Social History Narrative  . No narrative on file     Current Outpatient Prescriptions:  .  acyclovir ointment (ZOVIRAX) 5 %, Apply 1 application topically 2 (two) times daily as needed. Reported on 06/13/2015, Disp: , Rfl:  .  albuterol (PROVENTIL HFA) 108 (90 BASE) MCG/ACT inhaler, Inhale 2 puffs into the lungs as needed., Disp: , Rfl:  .  aspirin EC 81 MG tablet, Take 1 tablet by mouth daily., Disp: , Rfl:  .  carvedilol (COREG) 6.25 MG tablet, Take 1 tablet (6.25 mg total) by mouth 2 (two) times daily., Disp: 180 tablet, Rfl: 1 .  Coenzyme Q10 (CO Q 10) 100 MG CAPS,  Take 1 capsule by mouth daily., Disp: 30 capsule, Rfl: 0 .  colchicine 0.6 MG tablet, Take 1 tablet (0.6 mg total) by mouth daily. Take two at onset of gout and repeat in 1 hour, prn (Patient not taking: Reported on 06/13/2015), Disp: 30 tablet, Rfl: 0 .  Elastic Bandages & Supports (TENNIS ELBOW NEOPRENE BRACE) MISC, 1 Units by Does not apply route daily., Disp: 1 each, Rfl: 0 .  LORazepam (ATIVAN) 0.5 MG tablet, Take 1 tablet (0.5 mg total) by mouth at bedtime as needed. for sleep, Disp: 30 tablet, Rfl: 2 .  losartan (COZAAR) 50 MG tablet, TAKE 1 TABLET (50 MG TOTAL) BY MOUTH DAILY., Disp: 90 tablet, Rfl: 1 .  omeprazole (PRILOSEC) 20 MG capsule, Take 1 capsule by mouth as needed., Disp: , Rfl:  .  rosuvastatin (CRESTOR) 40 MG tablet, TAKE 1 TABLET (40 MG TOTAL) BY MOUTH DAILY., Disp: 90 tablet, Rfl: 0 .  valACYclovir (VALTREX) 500 MG tablet, Take 1 tablet (500 mg total) by mouth daily., Disp: 90 tablet, Rfl: 4 .  zolpidem (AMBIEN) 10 MG tablet, TAKE 1 TABLET BY MOUTH AT BEDTIME, Disp: 90 tablet, Rfl: 0  Allergies  Allergen Reactions  . Metformin And Related Diarrhea    And nausea     ROS  Constitutional: Negative for fever, positive for  weight change.  Respiratory: Positive  for cough but no  shortness of breath.   Cardiovascular: Negative for chest pain or palpitations.  Gastrointestinal: Negative for abdominal pain, no bowel changes.  Musculoskeletal: Negative for gait problem or joint swelling.  Skin: Negative for rash.  Neurological: Negative for dizziness or headache.  No other specific complaints in a complete review of systems (except as listed in HPI above).  Objective  Vitals:   01/10/16 1007  BP: 140/80  Pulse: 71  Resp: 18  Temp: 99 F (37.2 C)  SpO2: 96%  Weight: 172 lb 5 oz (78.2 kg)  Height:  (1.6 m)    Body mass index is 30.52 kg/m.  Physical Exam  Constitutional: Patient appears well-developed and well-nourished. Obese  No distress.  HEENT: head  atraumatic, normocephalic, pupils equal and reactive to light, ears normal TM bilaterally, neck supple, throat within normal limits, tender during palpation of left maxillary sinus, and boggy turbinates Cardiovascular: Normal rate, regular rhythm and normal heart sounds.  No murmur heard. No BLE edema. Pulmonary/Chest: Effort normal and breath sounds normal. No respiratory distress. Abdominal: Soft.  There is no tenderness. Psychiatric: Patient has a normal mood and affect. behavior is normal. Judgment and thought content normal Muscular Skeletal: crepitus with extension of both knees  Recent Results (from the past  2160 hour(s))  Urinalysis complete, with microscopic     Status: Abnormal   Collection Time: 12/11/15  9:24 AM  Result Value Ref Range   Color, Urine AMBER (A) YELLOW   APPearance CLEAR CLEAR   Glucose, UA NEGATIVE NEGATIVE mg/dL   Bilirubin Urine NEGATIVE NEGATIVE   Ketones, ur NEGATIVE NEGATIVE mg/dL   Specific Gravity, Urine 1.025 1.005 - 1.030   Hgb urine dipstick NEGATIVE NEGATIVE   pH 5.5 5.0 - 8.0   Protein, ur TRACE (A) NEGATIVE mg/dL   Nitrite NEGATIVE NEGATIVE   Leukocytes, UA NEGATIVE NEGATIVE   RBC / HPF 0-5 0 - 5 RBC/hpf   WBC, UA 0-5 0 - 5 WBC/hpf   Bacteria, UA RARE (A) NONE SEEN   Squamous Epithelial / LPF 6-30 (A) NONE SEEN  Urine culture     Status: Abnormal   Collection Time: 12/11/15  9:24 AM  Result Value Ref Range   Specimen Description URINE, CLEAN CATCH    Special Requests Normal    Culture (A)     7,000 COLONIES/mL INSIGNIFICANT GROWTH Performed at [facility]    Report Status 12/12/2015 FINAL   POCT HgB A1C     Status: None   Collection Time: 01/10/16 10:13 AM  Result Value Ref Range   Hemoglobin A1C 7.0     Diabetic Foot Exam: Diabetic Foot Exam - Simple   Simple Foot Form Diabetic Foot exam was performed with the following findings:  Yes 01/10/2016 10:51 AM  Visual Inspection No deformities, no ulcerations, no other skin  breakdown bilaterally:  Yes Sensation Testing Intact to touch and monofilament testing bilaterally:  Yes Pulse Check Posterior Tibialis and Dorsalis pulse intact bilaterally:  Yes Comments      PHQ2/9: Depression screen [facility] 2/9 01/10/2016 09/11/2015 06/13/2015 01/24/2015 11/18/2014  Decreased Interest 0 0 0 0 0  Down, Depressed, Hopeless 0 0 0 0 0  PHQ - 2 Score 0 0 0 0 0     Fall Risk: Fall Risk  01/10/2016 09/11/2015 06/13/2015 01/24/2015 11/18/2014  Falls in the past year? No No No No No      Functional Status Survey: Is the patient deaf or have difficulty hearing?: No Does the patient have difficulty seeing, even when wearing glasses/contacts?: Yes Does the patient have difficulty concentrating, remembering, or making decisions?: No Does the patient have difficulty walking or climbing stairs?: No Does the patient have difficulty dressing or bathing?: No Does the patient have difficulty doing errands alone such as visiting a doctor's office or shopping?: No    Assessment & Plan  1. Controlled type 2 diabetes mellitus with diabetic neuropathy, without long-term current use of insulin (HCC)  Going up, she will resume diet, does not want to resume medication at this time - POCT HgB A1C - Hemoglobin A1c  2. Essential hypertension  - COMPLETE METABOLIC PANEL WITH GFR  3. Insomnia, persistent  Continue medication   4. Dyslipidemia  - Lipid panel  5. Asthma, mild intermittent, uncomplicated  Doing well, continue prn medication   6. History of acute myocardial infarction   7. Primary osteoarthritis of knee, right   8. Chondromalacia of right knee   9. Derangement of posterior horn of lateral meniscus of right knee   10. Controlled gout  - Uric acid  11. Need for pneumococcal vaccination  - Pneumococcal conjugate vaccine 13-valent IM  12. Upper respiratory infection  Continue otc medication, saline spray and steroid nasal spray call back if worsening of  symptoms

## 2016-01-26 ENCOUNTER — Ambulatory Visit
Admission: EM | Admit: 2016-01-26 | Discharge: 2016-01-26 | Disposition: A | Discharge status: Home / Self Care | Payer: PPO | Attending: Family Medicine | Admitting: Family Medicine

## 2016-01-26 DIAGNOSIS — K0530 Chronic periodontitis, unspecified: Secondary | ICD-10-CM | POA: Diagnosis not present

## 2016-01-26 DIAGNOSIS — K0510 Chronic gingivitis, plaque induced: Secondary | ICD-10-CM | POA: Diagnosis not present

## 2016-01-26 MED ORDER — PENICILLIN V POTASSIUM 500 MG PO TABS: 500.0000 mg | ORAL_TABLET | Freq: Three times a day (TID) | ORAL | 0 refills | Status: AC

## 2016-01-26 NOTE — ED Provider Notes (Signed)
[facility]    CSN: [id number] Arrival date & time: 01/26/16  [id number]  First Provider Contact:  None       History   Chief Complaint Chief Complaint  Patient presents with  . Abscess    HPI [name] is a 65 y.o. female.   The history is provided by the patient.  Dental Pain  Location:  Lower Quality:  Aching and dull Severity:  Moderate Onset quality:  Gradual Duration:  1 week Timing:  Constant Progression:  Worsening Chronicity:  New Context: dental caries   Relieved by:  Nothing Worsened by:  Touching and pressure Associated symptoms: no congestion, no difficulty swallowing, no drooling, no facial pain, no facial swelling, no fever, no neck pain and no neck swelling   Risk factors: periodontal disease   Risk factors: no alcohol problem, no cancer, no diabetes, no immunosuppression and sufficient dental care     Past Medical History:  Diagnosis Date  . Anxiety   . Asthma   . Diabetes mellitus without complication (HCC)   . GERD (gastroesophageal reflux disease)   . Hyperlipidemia   . Insomnia     Patient Active Problem List   Diagnosis Date Noted  . 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 Derangement of posterior horn of lateral meniscus of right knee 01/10/2016  . 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 Primary osteoarthritis of knee 01/24/2015  . Asthma, mild intermittent 11/18/2014  . Arteriosclerosis of coronary artery 11/18/2014  . Insomnia, persistent 11/18/2014  . Dyslipidemia 11/18/2014  . Acid reflux 11/18/2014  . Diabetes type 2, controlled (HCC) 11/18/2014  . Genital herpes in women 11/18/2014  . Hypertension 11/18/2014  . Asymptomatic postmenopausal status 11/18/2014  . History of acute myocardial infarction 11/18/2014    Past Surgical History:  Procedure Laterality Date  . ABDOMINAL HYSTERECTOMY    . BREAST SURGERY     breast reduction  . TUBAL LIGATION      OB History    No data available       Home Medications     Prior to Admission medications   Medication Sig Start Date End Date Taking? Authorizing Provider  acyclovir ointment (ZOVIRAX) 5 % Apply 1 application topically 2 (two) times daily as needed. Reported on 06/13/2015 04/28/14  Yes Historical Provider, MD  albuterol (PROVENTIL HFA) 108 (90 BASE) MCG/ACT inhaler Inhale 2 puffs into the lungs as needed.   Yes Historical Provider, MD  aspirin EC 81 MG tablet Take 1 tablet by mouth daily.   Yes Historical Provider, MD  carvedilol (COREG) 6.25 MG tablet Take 1 tablet (6.25 mg total) by mouth 2 (two) times daily. 09/11/15  Yes Alba CoryKrichna Sowles, MD  Coenzyme Q10 (CO Q 10) 100 MG CAPS Take 1 capsule by mouth daily. 06/13/15  Yes Alba CoryKrichna Sowles, MD  colchicine 0.6 MG tablet Take 1 tablet (0.6 mg total) by mouth daily. Take two at onset of gout and repeat in 1 hour, prn 01/24/15  Yes Alba CoryKrichna Sowles, MD  Elastic Bandages & Supports (TENNIS ELBOW NEOPRENE BRACE) MISC 1 Units by Does not apply route daily. 06/13/15  Yes Alba CoryKrichna Sowles, MD  LORazepam (ATIVAN) 0.5 MG tablet Take 1 tablet (0.5 mg total) by mouth at bedtime as needed. for sleep 06/13/15  Yes Alba CoryKrichna Sowles, MD  losartan (COZAAR) 50 MG tablet TAKE 1 TABLET (50 MG TOTAL) BY MOUTH DAILY. 01/09/16  Yes Alba CoryKrichna Sowles, MD  omeprazole (PRILOSEC) 20 MG capsule Take 1 capsule by mouth as needed.   Yes  Historical Provider, MD  rosuvastatin (CRESTOR) 40 MG tablet TAKE 1 TABLET (40 MG TOTAL) BY MOUTH DAILY. 12/19/15  Yes Alba CoryKrichna Sowles, MD  valACYclovir (VALTREX) 500 MG tablet Take 1 tablet (500 mg total) by mouth daily. 06/13/15  Yes Alba CoryKrichna Sowles, MD  zolpidem (AMBIEN) 10 MG tablet TAKE 1 TABLET BY MOUTH AT BEDTIME 12/12/15  Yes Alba CoryKrichna Sowles, MD  penicillin v potassium (VEETID) 500 MG tablet Take 1 tablet (500 mg total) by mouth 3 (three) times daily. 01/26/16 02/05/16  Dawn Mccallumrlando Nhia Heaphy, MD    Family History Family History  Problem Relation Age of Onset  . Hyperlipidemia Mother   . Hypertension Mother   . Stroke Mother    . Dementia Father   . Cancer Sister     breast  . Asthma Brother   . Depression Brother     bipolar  . Heart disease Brother   . Heart disease Brother     Social History Social History  Substance Use Topics  . Smoking status: Never Smoker  . Smokeless tobacco: Never Used  . Alcohol use No     Allergies   Metformin and related   Review of Systems Review of Systems  Constitutional: Negative for fever.  HENT: Negative for congestion, drooling and facial swelling.   Musculoskeletal: Negative for neck pain.     Physical Exam Triage Vital Signs ED Triage Vitals  Enc Vitals Group     BP 01/26/16 0909 126/75     Pulse Rate 01/26/16 0909 70     Resp 01/26/16 0909 16     Temp 01/26/16 0909 97.3 F (36.3 C)     Temp Source 01/26/16 0909 Tympanic     SpO2 01/26/16 0909 98 %     Weight 01/26/16 0910 167 lb (75.8 kg)     Height 01/26/16 0910 5\' 3"  (1.6 m)     Head Circumference --      Peak Flow --      Pain Score 01/26/16 0912 7     Pain Loc --      Pain Edu? --      Excl. in GC? --    No data found.   Updated Vital Signs BP 126/75 (BP Location: Left Arm)   Pulse 70   Temp 97.3 F (36.3 C) (Tympanic)   Resp 16   Ht 5\' 3"  (1.6 m)   Wt 167 lb (75.8 kg)   SpO2 98%   BMI 29.58 kg/m   Visual Acuity Right Eye Distance:   Left Eye Distance:   Bilateral Distance:    Right Eye Near:   Left Eye Near:    Bilateral Near:     Physical Exam  Constitutional: She appears well-developed and well-nourished. No distress.  HENT:  Head: Normocephalic.  Right Ear: External ear normal.  Left Ear: External ear normal.  Nose: Nose normal.  Mouth/Throat: Oropharynx is clear and moist and mucous membranes are normal. She has dentures. Abnormal dentition. Dental caries present.  Eyes: Conjunctivae and EOM are normal. Pupils are equal, round, and reactive to light. Right eye exhibits no discharge. Left eye exhibits no discharge. No scleral icterus.  Neck: Normal range of  motion. Neck supple. No JVD present. No tracheal deviation present. No thyromegaly present.  Cardiovascular: Normal rate.   Pulmonary/Chest: Effort normal. No stridor. No respiratory distress.  Lymphadenopathy:    She has no cervical adenopathy.  Neurological: She is alert.  Skin: Skin is warm. No rash noted. She is not diaphoretic. No  erythema.  Nursing note and vitals reviewed.    UC Treatments / Results  Labs (all labs ordered are listed, but only abnormal results are displayed) Labs Reviewed - No data to display  EKG  EKG Interpretation None       Radiology No results found.  Procedures Procedures (including critical care time)  Medications Ordered in UC Medications - No data to display   Initial Impression / Assessment and Plan / UC Course  I have reviewed the triage vital signs and the nursing notes.  Pertinent labs & imaging results that were available during my care of the patient were reviewed by me and considered in my medical decision making (see chart for details).  Clinical Course      Final Clinical Impressions(s) / UC Diagnoses   Final diagnoses:  Gingivitis  Periodontitis    New Prescriptions Discharge Medication List as of 01/26/2016  9:22 AM    START taking these medications   Details  penicillin v potassium (VEETID) 500 MG tablet Take 1 tablet (500 mg total) by mouth 3 (three) times daily., Starting Fri 01/26/2016, Until Mon 02/05/2016, Normal       1. diagnosis reviewed with patient 2. rx as per orders above; reviewed possible side effects, interactions, risks and benefits  3. Recommend supportive treatment with salt water rinses 4. Follow-up prn if symptoms worsen or don't improve   Dawn Mccallum, MD 01/26/16 [phone]

## 2016-01-26 NOTE — ED Triage Notes (Signed)
Patient states that she has a left jaw swelling secondary to dental abscess. Patient states that she noticed the area on Monday and it worsened yesterday. Patient states that she has been having pain and swelling. Patient reports that her dental office is closed today.

## 2016-02-19 ENCOUNTER — Other Ambulatory Visit: Payer: Self-pay | Admitting: Family Medicine

## 2016-02-19 DIAGNOSIS — G4700 Insomnia, unspecified: Secondary | ICD-10-CM

## 2016-03-14 ENCOUNTER — Other Ambulatory Visit: Payer: Self-pay | Admitting: Family Medicine

## 2016-03-14 NOTE — Telephone Encounter (Signed)
Patient requesting refill. 

## 2016-04-05 ENCOUNTER — Other Ambulatory Visit: Payer: Self-pay | Admitting: Family Medicine

## 2016-04-05 NOTE — Telephone Encounter (Signed)
Patient requesting refill of Crestor to [facility]. 

## 2016-05-13 ENCOUNTER — Ambulatory Visit: Payer: PPO | Admitting: Family Medicine

## 2016-05-24 ENCOUNTER — Encounter: Payer: Self-pay | Admitting: Family Medicine

## 2016-05-24 ENCOUNTER — Ambulatory Visit (INDEPENDENT_AMBULATORY_CARE_PROVIDER_SITE_OTHER): Payer: PPO | Admitting: Family Medicine

## 2016-05-24 ENCOUNTER — Other Ambulatory Visit: Payer: Self-pay | Admitting: Family Medicine

## 2016-05-24 VITALS — BP 122/74 | HR 83 | Temp 98.1°F | Resp 16 | Ht 64.25 in | Wt 171.6 lb

## 2016-05-24 DIAGNOSIS — E1121 Type 2 diabetes mellitus with diabetic nephropathy: Secondary | ICD-10-CM

## 2016-05-24 DIAGNOSIS — E1140 Type 2 diabetes mellitus with diabetic neuropathy, unspecified: Secondary | ICD-10-CM

## 2016-05-24 DIAGNOSIS — M94261 Chondromalacia, right knee: Secondary | ICD-10-CM

## 2016-05-24 DIAGNOSIS — I252 Old myocardial infarction: Secondary | ICD-10-CM

## 2016-05-24 DIAGNOSIS — M109 Gout, unspecified: Secondary | ICD-10-CM | POA: Diagnosis not present

## 2016-05-24 DIAGNOSIS — I10 Essential (primary) hypertension: Secondary | ICD-10-CM | POA: Diagnosis not present

## 2016-05-24 DIAGNOSIS — G4700 Insomnia, unspecified: Secondary | ICD-10-CM | POA: Diagnosis not present

## 2016-05-24 DIAGNOSIS — E785 Hyperlipidemia, unspecified: Secondary | ICD-10-CM

## 2016-05-24 DIAGNOSIS — A6009 Herpesviral infection of other urogenital tract: Secondary | ICD-10-CM | POA: Diagnosis not present

## 2016-05-24 DIAGNOSIS — J4520 Mild intermittent asthma, uncomplicated: Secondary | ICD-10-CM | POA: Diagnosis not present

## 2016-05-24 DIAGNOSIS — Z1231 Encounter for screening mammogram for malignant neoplasm of breast: Secondary | ICD-10-CM | POA: Diagnosis not present

## 2016-05-24 DIAGNOSIS — Z23 Encounter for immunization: Secondary | ICD-10-CM | POA: Diagnosis not present

## 2016-05-24 LAB — HEMOGLOBIN A1C
Hgb A1c MFr Bld: 7.8 % — ABNORMAL HIGH (ref <5.7)
Mean Plasma Glucose: 177 mg/dL

## 2016-05-24 MED ORDER — ROSUVASTATIN CALCIUM 40 MG PO TABS: ORAL_TABLET | ORAL | 1 refills | Status: DC

## 2016-05-24 MED ORDER — CARVEDILOL 6.25 MG PO TABS: 6.2500 mg | ORAL_TABLET | Freq: Two times a day (BID) | ORAL | 1 refills | Status: DC

## 2016-05-24 MED ORDER — VALACYCLOVIR HCL 500 MG PO TABS: 500.0000 mg | ORAL_TABLET | Freq: Every day | ORAL | 4 refills | Status: DC

## 2016-05-24 MED ORDER — FLUTICASONE FUROATE-VILANTEROL 100-25 MCG/INH IN AEPB: 1.0000 | INHALATION_SPRAY | Freq: Every day | RESPIRATORY_TRACT | 0 refills | Status: DC

## 2016-05-24 MED ORDER — LOSARTAN POTASSIUM 50 MG PO TABS: 50.0000 mg | ORAL_TABLET | Freq: Every day | ORAL | 1 refills | Status: DC

## 2016-05-24 NOTE — Progress Notes (Signed)
Name: Dawn Werner   MRN: [id number]    DOB: 02/13/1951   Date:05/24/2016       Progress Note  Subjective  Chief Complaint  Chief Complaint  Patient presents with  . Insomnia    patient is sleeping better now and is trying to reduce the amount of meds (Ambien) used   . Hypertension    no complains  . Coronary Artery Disease  . Diabetes    patient checks occ. this morning it was 122  . Hyperlipidemia    no complains. patient has been taking meds as directed  . Asthma    no complains  . Knee Pain    patient stated that the pain comes and goes based upon what she is doing. patient stated that she is able to walk on the treadmill  . Labs Only    patient stated that she did not have her labs done that was ordered before. she is fasting today  . Allergic Rhinitis     running nose and some congestion  . Immunizations    flu  . Orders    mammogram    HPI  Hyperlipidemia: she is back on Crestor 40mg  and states no longer has muscle pain or cramps. No chest pain  Insomnia: she was trying to wean self off of Ambien, she has 10 mg tablets, and is taking about 7.5 mg and trying to go down to 5 mg qhs. She will call back when she needs refills.   HTN:  No chest pain, palpitation or SOB,taking medication as prescribed. She states bp at home has been well controlled 120's-130's/70's  CAD: no episodes, s/p history of MI in 2009, taking aspirin daily, beta-blocker and  statin therapy. No chest pain, no decrease in exercise tolerance, walking 30 daily without discomfort.   DM: history of proteinuria, taking ARB, off Metformin because of diarrhea and nausea, DM has been diet controlled, stopped sodas, sweet tea and desserts since March 1st, 2017 and HgbA1C was down to 6.6% . Denies polyphagia, polydipsia or polyuria.   Asthma Mild Intermittent: she has noticed a productive cough over the past week, that is worse at night . She denies wheezing, but cough is disturbing her sleep. No  decrease in exercise tolerance  Right knee pain: she states pain is okay - 0/10 at this time, seen by [facility]. Taking Aleve prn. No longer has instability and no swelling. She has a lot of popping when going up steps.    Patient Active Problem List   Diagnosis Date Noted  . 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 Derangement of posterior horn of lateral meniscus of right knee 01/10/2016  . 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 Primary osteoarthritis of knee 01/24/2015  . Asthma, mild intermittent 11/18/2014  . Arteriosclerosis of coronary artery 11/18/2014  . Insomnia, persistent 11/18/2014  . Dyslipidemia 11/18/2014  . Acid reflux 11/18/2014  . Diabetes type 2, controlled (HCC) 11/18/2014  . Genital herpes in women 11/18/2014  . Hypertension 11/18/2014  . Asymptomatic postmenopausal status 11/18/2014  . History of acute myocardial infarction 11/18/2014    Past Surgical History:  Procedure Laterality Date  . ABDOMINAL HYSTERECTOMY    . BREAST SURGERY     breast reduction  . TUBAL LIGATION      Family History  Problem Relation Age of Onset  . Hyperlipidemia Mother   . Hypertension Mother   . Stroke Mother   . Dementia Father   . Cancer Sister  breast  . Asthma Brother   . Depression Brother     bipolar  . Heart disease Brother   . Heart disease Brother     Social History   Social History  . Marital status: Married    Spouse name: N/A  . Number of children: N/A  . Years of education: N/A   Occupational History  . Not on file.   Social History Main Topics  . Smoking status: Never Smoker  . Smokeless tobacco: Never Used  . Alcohol use No  . Drug use: No  . Sexual activity: Yes    Partners: Male   Other Topics Concern  . Not on file   Social History Narrative  . No narrative on file     Current Outpatient Prescriptions:  .  acyclovir ointment (ZOVIRAX) 5 %, Apply 1 application topically 2 (two) times daily as needed. Reported on 06/13/2015,  Disp: , Rfl:  .  albuterol (PROVENTIL HFA) 108 (90 BASE) MCG/ACT inhaler, Inhale 2 puffs into the lungs as needed., Disp: , Rfl:  .  aspirin EC 81 MG tablet, Take 1 tablet by mouth daily., Disp: , Rfl:  .  carvedilol (COREG) 6.25 MG tablet, Take 1 tablet (6.25 mg total) by mouth 2 (two) times daily., Disp: 180 tablet, Rfl: 1 .  Coenzyme Q10 (CO Q 10) 100 MG CAPS, Take 1 capsule by mouth daily., Disp: 30 capsule, Rfl: 0 .  colchicine 0.6 MG tablet, Take 1 tablet (0.6 mg total) by mouth daily. Take two at onset of gout and repeat in 1 hour, prn, Disp: 30 tablet, Rfl: 0 .  Elastic Bandages & Supports (TENNIS ELBOW NEOPRENE BRACE) MISC, 1 Units by Does not apply route daily., Disp: 1 each, Rfl: 0 .  LORazepam (ATIVAN) 0.5 MG tablet, TAKE 1 TABLET BY MOUTH AT BEDTIME AS NEEDED FOR SLEEP, Disp: 30 tablet, Rfl: 0 .  losartan (COZAAR) 50 MG tablet, Take 1 tablet (50 mg total) by mouth daily., Disp: 90 tablet, Rfl: 1 .  rosuvastatin (CRESTOR) 40 MG tablet, TAKE 1 TABLET (40 MG TOTAL) BY MOUTH DAILY., Disp: 90 tablet, Rfl: 1 .  valACYclovir (VALTREX) 500 MG tablet, Take 1 tablet (500 mg total) by mouth daily. And twice daily for outbreaks, Disp: 100 tablet, Rfl: 4 .  zolpidem (AMBIEN) 10 MG tablet, TAKE 1 TABLET AT BEDTIME, Disp: 90 tablet, Rfl: 0  Allergies  Allergen Reactions  . Metformin And Related Diarrhea    And nausea     ROS  Constitutional: Negative for fever or weight change.  Respiratory: Negative for cough and shortness of breath.   Cardiovascular: Negative for chest pain or palpitations.  Gastrointestinal: Negative for abdominal pain, no bowel changes.  Musculoskeletal: Negative for gait problem or joint swelling.  Skin: Negative for rash.  Neurological: Negative for dizziness or headache.  No other specific complaints in a complete review of systems (except as listed in HPI above).  Objective  Vitals:   05/24/16 0854  BP: 122/74  Pulse: 83  Resp: 16  Temp: 98.1 F (36.7 C)   TempSrc: Oral  SpO2: 96%  Weight: 171 lb 9.6 oz (77.8 kg)  Height: 5' 4.25" (1.632 m)    Body mass index is 29.23 kg/m.  Physical Exam  Constitutional: Patient appears well-developed and well-nourished. Obese No distress.  HEENT: head atraumatic, normocephalic, pupils equal and reactive to light,  neck supple, throat within normal limits Cardiovascular: Normal rate, regular rhythm and normal heart sounds.  No murmur heard. No  BLE edema. Pulmonary/Chest: Effort normal and breath sounds normal. No respiratory distress. Abdominal: Soft.  There is no tenderness. Psychiatric: Patient has a normal mood and affect. behavior is normal. Judgment and thought content normal. Muscular Skeletal : grinding both keens with extension    PHQ2/9: Depression screen [facility] 01/10/2016 09/11/2015 06/13/2015 01/24/2015 11/18/2014  Decreased Interest 0 0 0 0 0  Down, Depressed, Hopeless 0 0 0 0 0  PHQ - 2 Score 0 0 0 0 0     Fall Risk: Fall Risk  01/10/2016 09/11/2015 06/13/2015 01/24/2015 11/18/2014  Falls in the past year? No No No No No   Assessment & Plan  1. Controlled type 2 diabetes mellitus with diabetic neuropathy, without long-term current use of insulin (HCC)  Getting labs done today  2. Essential hypertension  - losartan (COZAAR) 50 MG tablet; Take 1 tablet (50 mg total) by mouth daily.  Dispense: 90 tablet; Refill: 1 - carvedilol (COREG) 6.25 MG tablet; Take 1 tablet (6.25 mg total) by mouth 2 (two) times daily.  Dispense: 180 tablet; Refill: 1  3. Controlled gout  Getting labs today   4. Insomnia, persistent  Continue trying to wean self off of Ambien   5. Dyslipidemia  - rosuvastatin (CRESTOR) 40 MG tablet; TAKE 1 TABLET (40 MG TOTAL) BY MOUTH DAILY.  Dispense: 90 tablet; Refill: 1  6. History of acute myocardial infarction  - rosuvastatin (CRESTOR) 40 MG tablet; TAKE 1 TABLET (40 MG TOTAL) BY MOUTH DAILY.  Dispense: 90 tablet; Refill: 1  7. Controlled type 2 diabetes mellitus  with diabetic nephropathy, without long-term current use of insulin (HCC)  - losartan (COZAAR) 50 MG tablet; Take 1 tablet (50 mg total) by mouth daily.  Dispense: 90 tablet; Refill: 1  8. Needs flu shot  - Flu vaccine HIGH DOSE PF  9. Genital herpes in women  - valACYclovir (VALTREX) 500 MG tablet; Take 1 tablet (500 mg total) by mouth daily. And twice daily for outbreaks  Dispense: 100 tablet; Refill: 4  10. Encounter for screening mammogram for breast cancer  - MM Digital Screening; Future   11. Chondromalacia of right knee  Doing well on activity   12. Asthma, mild intermittent, poorly controlled  - fluticasone furoate-vilanterol (BREO ELLIPTA) 100-25 MCG/INH AEPB; Inhale 1 puff into the lungs daily.  Dispense: 60 each; Refill: 0

## 2016-05-25 LAB — COMPLETE METABOLIC PANEL WITH GFR
ALT: 42 U/L — ABNORMAL HIGH (ref 6–29)
AST: 33 U/L (ref 10–35)
Albumin: 4.4 g/dL (ref 3.6–5.1)
Alkaline Phosphatase: 101 U/L (ref 33–130)
BUN: 10 mg/dL (ref 7–25)
CO2: 27 mmol/L (ref 20–31)
Calcium: 9.6 mg/dL (ref 8.6–10.4)
Chloride: 101 mmol/L (ref 98–110)
Creat: 0.7 mg/dL (ref 0.50–0.99)
GFR, Est African American: >89 mL/min (ref >=60)
GFR, Est Non African American: >89 mL/min (ref >=60)
Glucose, Bld: 224 mg/dL — ABNORMAL HIGH (ref 65–99)
Potassium: 4.3 mmol/L (ref 3.5–5.3)
Sodium: 140 mmol/L (ref 135–146)
Total Bilirubin: 0.4 mg/dL (ref 0.2–1.2)
Total Protein: 7.2 g/dL (ref 6.1–8.1)

## 2016-05-25 LAB — LIPID PANEL
Cholesterol: 178 mg/dL (ref <200)
HDL: 41 mg/dL — ABNORMAL LOW (ref >50)
LDL Cholesterol: 114 mg/dL — ABNORMAL HIGH (ref <100)
Total CHOL/HDL Ratio: 4.3 Ratio (ref <5.0)
Triglycerides: 117 mg/dL (ref <150)
VLDL: 23 mg/dL (ref <30)

## 2016-05-25 LAB — URIC ACID: Uric Acid, Serum: 5.1 mg/dL (ref 2.5–7.0)

## 2016-06-18 ENCOUNTER — Other Ambulatory Visit: Payer: Self-pay | Admitting: Family Medicine

## 2016-06-18 NOTE — Telephone Encounter (Signed)
Patient requesting refill of Ambien to [facility]. 

## 2016-06-21 ENCOUNTER — Telehealth: Payer: Self-pay | Admitting: Family Medicine

## 2016-06-21 NOTE — Telephone Encounter (Signed)
JAMIE IT SAYS THAT THE RX WAS DONE ON 06-18-16 BUT NOT UP FRONT AND PT SAID SHE DOES NOT HAVE IT.

## 2016-06-21 NOTE — Telephone Encounter (Signed)
Called pharmacy they have Ambien and is waiting at pharmacy to pick-up.  Left Pt voicemail.

## 2016-06-24 ENCOUNTER — Ambulatory Visit (INDEPENDENT_AMBULATORY_CARE_PROVIDER_SITE_OTHER): Payer: PPO

## 2016-06-24 VITALS — BP 108/60 | HR 76 | Temp 98.5°F | Ht 64.0 in | Wt 169.6 lb

## 2016-06-24 DIAGNOSIS — Z0000 Encounter for general adult medical examination without abnormal findings: Secondary | ICD-10-CM

## 2016-06-24 NOTE — Patient Instructions (Signed)
Health Maintenance, Female Introduction Adopting a healthy lifestyle and getting preventive care can go a long way to promote health and wellness. Talk with your health care provider about what schedule of regular examinations is right for you. This is a good chance for you to check in with your provider about disease prevention and staying healthy. In between checkups, there are plenty of things you can do on your own. Experts have done a lot of research about which lifestyle changes and preventive measures are most likely to keep you healthy. Ask your health care provider for more information. Weight and diet Eat a healthy diet  Be sure to include plenty of vegetables, fruits, low-fat dairy products, and lean protein.  Do not eat a lot of foods high in solid fats, added sugars, or salt.  Get regular exercise. This is one of the most important things you can do for your health.  Most adults should exercise for at least 150 minutes each week. The exercise should increase your heart rate and make you sweat (moderate-intensity exercise).  Most adults should also do strengthening exercises at least twice a week. This is in addition to the moderate-intensity exercise. Maintain a healthy weight  Body mass index (BMI) is a measurement that can be used to identify possible weight problems. It estimates body fat based on height and weight. Your health care provider can help determine your BMI and help you achieve or maintain a healthy weight.  For females 4 years of age and older:  A BMI below 18.5 is considered underweight.  A BMI of 18.5 to 24.9 is normal.  A BMI of 25 to 29.9 is considered overweight.  A BMI of 30 and above is considered obese. Watch levels of cholesterol and blood lipids  You should start having your blood tested for lipids and cholesterol at 67 years of age, then have this test every 5 years.  You may need to have your cholesterol levels checked more often  if:  Your lipid or cholesterol levels are high.  You are older than 67 years of age.  You are at high risk for heart disease. Cancer screening Lung Cancer  Lung cancer screening is recommended for adults 12-31 years old who are at high risk for lung cancer because of a history of smoking.  A yearly low-dose CT scan of the lungs is recommended for people who:  Currently smoke.  Have quit within the past 15 years.  Have at least a 30-pack-year history of smoking. A pack year is smoking an average of one pack of cigarettes a day for 1 year.  Yearly screening should continue until it has been 15 years since you quit.  Yearly screening should stop if you develop a health problem that would prevent you from having lung cancer treatment. Breast Cancer  Practice breast self-awareness. This means understanding how your breasts normally appear and feel.  It also means doing regular breast self-exams. Let your health care provider know about any changes, no matter how small.  If you are in your 20s or 30s, you should have a clinical breast exam (CBE) by a health care provider every 1-3 years as part of a regular health exam.  If you are 74 or older, have a CBE every year. Also consider having a breast X-ray (mammogram) every year.  If you have a family history of breast cancer, talk to your health care provider about genetic screening.  If you are at high risk for breast cancer,  talk to your health care provider about having an MRI and a mammogram every year.  Breast cancer gene (BRCA) assessment is recommended for women who have family members with BRCA-related cancers. BRCA-related cancers include:  Breast.  Ovarian.  Tubal.  Peritoneal cancers.  Results of the assessment will determine the need for genetic counseling and BRCA1 and BRCA2 testing. Colorectal Cancer  This type of cancer can be detected and often prevented.  Routine colorectal cancer screening usually begins  at 67 years of age and continues through 67 years of age.  Your health care provider may recommend screening at an earlier age if you have risk factors for colon cancer.  Your health care provider may also recommend using home test kits to check for hidden blood in the stool.  A small camera at the end of a tube can be used to examine your colon directly (sigmoidoscopy or colonoscopy). This is done to check for the earliest forms of colorectal cancer.  Routine screening usually begins at age 50.  Direct examination of the colon should be repeated every 5-10 years through 67 years of age. However, you may need to be screened more often if early forms of precancerous polyps or small growths are found. Skin Cancer  Check your skin from head to toe regularly.  Tell your health care provider about any new moles or changes in moles, especially if there is a change in a mole's shape or color.  Also tell your health care provider if you have a mole that is larger than the size of a pencil eraser.  Always use sunscreen. Apply sunscreen liberally and repeatedly throughout the day.  Protect yourself by wearing long sleeves, pants, a wide-brimmed hat, and sunglasses whenever you are outside. Heart disease, diabetes, and high blood pressure  High blood pressure causes heart disease and increases the risk of stroke. High blood pressure is more likely to develop in:  People who have blood pressure in the high end of the normal range (130-139/85-89 mm Hg).  People who are overweight or obese.  People who are African American.  If you are 18-39 years of age, have your blood pressure checked every 3-5 years. If you are 40 years of age or older, have your blood pressure checked every year. You should have your blood pressure measured twice-once when you are at a hospital or clinic, and once when you are not at a hospital or clinic. Record the average of the two measurements. To check your blood pressure  when you are not at a hospital or clinic, you can use:  An automated blood pressure machine at a pharmacy.  A home blood pressure monitor.  If you are between 55 years and 79 years old, ask your health care provider if you should take aspirin to prevent strokes.  Have regular diabetes screenings. This involves taking a blood sample to check your fasting blood sugar level.  If you are at a normal weight and have a low risk for diabetes, have this test once every three years after 67 years of age.  If you are overweight and have a high risk for diabetes, consider being tested at a younger age or more often. Preventing infection Hepatitis B  If you have a higher risk for hepatitis B, you should be screened for this virus. You are considered at high risk for hepatitis B if:  You were born in a country where hepatitis B is common. Ask your health care provider which countries are   considered high risk.  Your parents were born in a high-risk country, and you have not been immunized against hepatitis B (hepatitis B vaccine).  You have HIV or AIDS.  You use needles to inject street drugs.  You live with someone who has hepatitis B.  You have had sex with someone who has hepatitis B.  You get hemodialysis treatment.  You take certain medicines for conditions, including cancer, organ transplantation, and autoimmune conditions. Hepatitis C  Blood testing is recommended for:  Everyone born from [birth year] through 1966.  Anyone with known risk factors for hepatitis C.  Osteoporosis is a disease in which the bones lose minerals and strength with aging. This can result in serious bone fractures. Your risk for osteoporosis can be identified using a bone density scan.  If you are 28 years of age or older, or if you are at risk for osteoporosis and fractures, ask your health care provider if you should be screened.  Ask your health care provider whether you should take a calcium or vitamin D  supplement to lower your risk for osteoporosis.  Menopause may have certain physical symptoms and risks.  Hormone replacement therapy may reduce some of these symptoms and risks. Talk to your health care provider about whether hormone replacement therapy is right for you. Follow these instructions at home:  Schedule regular health, dental, and eye exams.  Stay current with your immunizations.  Do not use any tobacco products including cigarettes, chewing tobacco, or electronic cigarettes.  If you are pregnant, do not drink alcohol.  If you are breastfeeding, limit how much and how often you drink alcohol.  Limit alcohol intake to no more than 1 drink per day for nonpregnant women. One drink equals 12 ounces of beer, 5 ounces of wine, or 1 ounces of hard liquor.  Do not use street drugs.  Do not share needles.  Ask your health care provider for help if you need support or information about quitting drugs.  Tell your health care provider if you often feel depressed.  Tell your health care provider if you have ever been abused or do not feel safe at home. This information is not intended to replace advice given to you by your health care provider. Make sure you discuss any questions you have with your health care provider. Document Released: 12/10/2010 Document Revised: 11/02/2015 Document Reviewed: 02/28/2015  2017 Elsevier

## 2016-06-24 NOTE — Progress Notes (Signed)
Subjective:   Dawn Werner is a 66 y.o. female who presents for Medicare Annual (Subsequent) preventive examination.  Review of Systems:  N/A  Cardiac Risk Factors include: advanced age (>4355men, 33>65 women);diabetes mellitus;dyslipidemia;hypertension     Objective:     Vitals: BP 108/60 (BP Location: Left Arm)   Pulse 76   Temp 98.5 F (36.9 C) (Oral)   Ht 5\' 4"  (1.626 m)   Wt 169 lb 9.6 oz (76.9 kg)   BMI 29.11 kg/m   Body mass index is 29.11 kg/m.   Tobacco History  Smoking Status  . Never Smoker  Smokeless Tobacco  . Never Used     Counseling given: Not Answered   Past Medical History:  Diagnosis Date  . Anxiety   . Asthma   . Diabetes mellitus without complication (HCC)   . GERD (gastroesophageal reflux disease)   . Hyperlipidemia   . Insomnia    Past Surgical History:  Procedure Laterality Date  . ABDOMINAL HYSTERECTOMY    . BREAST SURGERY     breast reduction  . TUBAL LIGATION     Family History  Problem Relation Age of Onset  . Hyperlipidemia Mother   . Hypertension Mother   . Stroke Mother   . Dementia Father   . Cancer Sister     breast  . Asthma Brother   . Depression Brother     bipolar  . Heart disease Brother   . Heart disease Brother    History  Sexual Activity  . Sexual activity: Yes  . Partners: Male    Outpatient Encounter Prescriptions as of 06/24/2016  Medication Sig  . acyclovir ointment (ZOVIRAX) 5 % Apply 1 application topically 2 (two) times daily as needed. Reported on 06/13/2015  . albuterol (PROVENTIL HFA) 108 (90 BASE) MCG/ACT inhaler Inhale 2 puffs into the lungs as needed.  Marland Kitchen. aspirin EC 81 MG tablet Take 1 tablet by mouth daily.  . carvedilol (COREG) 6.25 MG tablet Take 1 tablet (6.25 mg total) by mouth 2 (two) times daily.  . fluticasone furoate-vilanterol (BREO ELLIPTA) 100-25 MCG/INH AEPB Inhale 1 puff into the lungs daily. (Patient taking differently: Inhale 1 puff into the lungs daily. )  . LORazepam  (ATIVAN) 0.5 MG tablet TAKE 1 TABLET BY MOUTH AT BEDTIME AS NEEDED FOR SLEEP  . losartan (COZAAR) 50 MG tablet Take 1 tablet (50 mg total) by mouth daily.  . rosuvastatin (CRESTOR) 40 MG tablet TAKE 1 TABLET (40 MG TOTAL) BY MOUTH DAILY.  . valACYclovir (VALTREX) 500 MG tablet Take 1 tablet (500 mg total) by mouth daily. And twice daily for outbreaks  . zolpidem (AMBIEN) 10 MG tablet TAKE 1 TABLET BY MOUTH AT BEDTIME  . Coenzyme Q10 (CO Q 10) 100 MG CAPS Take 1 capsule by mouth daily. (Patient not taking: Reported on 06/24/2016)  . colchicine 0.6 MG tablet Take 1 tablet (0.6 mg total) by mouth daily. Take two at onset of gout and repeat in 1 hour, prn (Patient not taking: Reported on 06/24/2016)  . Elastic Bandages & Supports (TENNIS ELBOW NEOPRENE BRACE) MISC 1 Units by Does not apply route daily. (Patient not taking: Reported on 06/24/2016)   No facility-administered encounter medications on file as of 06/24/2016.     Activities of Daily Living In your present state of health, do you have any difficulty performing the following activities: 06/24/2016 01/10/2016  Hearing? N N  Vision? N Y  Difficulty concentrating or making decisions? N N  Walking or  climbing stairs? Y N  Dressing or bathing? N N  Doing errands, shopping? N N  Preparing Food and eating ? N -  Using the Toilet? N -  In the past six months, have you accidently leaked urine? N -  Do you have problems with loss of bowel control? N -  Managing your Medications? N -  Managing your Finances? N -  Housekeeping or managing your Housekeeping? N -  Some recent data might be hidden    Patient Care Team: Alba Cory, MD as PCP - General (Family Medicine) Sallee Lange, MD as Consulting Physician (Ophthalmology)    Assessment:     Exercise Activities and Dietary recommendations Current Exercise Habits: Home exercise routine, Type of exercise: treadmill;walking, Time (Minutes): 30 (or 1 mile), Frequency (Times/Week): 3,  Weekly Exercise (Minutes/Week): 90, Intensity: Mild  Goals    . Increase water intake          Starting 06/24/16, I will increase my water intake to 4 glasses a day.      Fall Risk Fall Risk  06/24/2016 01/10/2016 09/11/2015 06/13/2015 01/24/2015  Falls in the past year? No No No No No   Depression Screen PHQ 2/9 Scores 06/24/2016 01/10/2016 09/11/2015 06/13/2015  PHQ - 2 Score 0 0 0 0     Cognitive Function        Immunization History  Administered Date(s) Administered  . Influenza, High Dose Seasonal PF 02/20/2015, 05/24/2016  . Pneumococcal Conjugate-13 01/10/2016  . Pneumococcal Polysaccharide-23 11/18/2014  . Tdap 04/10/2007  . Tetanus 04/10/2007   Screening Tests Health Maintenance  Topic Date Due  . OPHTHALMOLOGY EXAM  08/28/2016  . HEMOGLOBIN A1C  11/22/2016  . FOOT EXAM  01/09/2017  . TETANUS/TDAP  04/09/2017  . MAMMOGRAM  05/24/2018  . COLONOSCOPY  06/19/2023  . INFLUENZA VACCINE  Completed  . DEXA SCAN  Completed  . ZOSTAVAX  Completed  . Hepatitis C Screening  Completed  . PNA vac Low Risk Adult  Completed      Plan:  I have personally reviewed and addressed the Medicare Annual Wellness questionnaire and have noted the following in the patient's chart:  A. Medical and social history B. Use of alcohol, tobacco or illicit drugs  C. Current medications and supplements D. Functional ability and status E.  Nutritional status F.  Physical activity G. Advance directives H. List of other physicians I.  Hospitalizations, surgeries, and ER visits in previous 12 months J.  Vitals K. Screenings such as hearing and vision if needed, cognitive and depression L. Referrals and appointments - none  In addition, I have reviewed and discussed with patient certain preventive protocols, quality metrics, and best practice recommendations. A written personalized care plan for preventive services as well as general preventive health recommendations were provided to patient.  See  attached scanned questionnaire for additional information.   Signed,  Hyacinth Meeker, LPN Nurse Health Advisor   MD Recommendations: Follow up on mammogram.

## 2016-07-09 ENCOUNTER — Ambulatory Visit (INDEPENDENT_AMBULATORY_CARE_PROVIDER_SITE_OTHER): Payer: PPO | Admitting: Family Medicine

## 2016-07-09 ENCOUNTER — Encounter: Payer: Self-pay | Admitting: Family Medicine

## 2016-07-09 VITALS — BP 140/80 | HR 104 | Temp 98.6°F | Wt 162.3 lb

## 2016-07-09 DIAGNOSIS — R350 Frequency of micturition: Secondary | ICD-10-CM | POA: Diagnosis not present

## 2016-07-09 DIAGNOSIS — E1165 Type 2 diabetes mellitus with hyperglycemia: Secondary | ICD-10-CM

## 2016-07-09 DIAGNOSIS — N898 Other specified noninflammatory disorders of vagina: Secondary | ICD-10-CM

## 2016-07-09 LAB — CBC WITH DIFFERENTIAL/PLATELET
Basophils Absolute: 96 cells/uL (ref 0–200)
Basophils Relative: 1 %
Eosinophils Absolute: 96 cells/uL (ref 15–500)
Eosinophils Relative: 1 %
HCT: 44.6 % (ref 35.0–45.0)
Hemoglobin: 14.9 g/dL (ref 11.7–15.5)
Lymphocytes Relative: 28 %
Lymphs Abs: 2688 cells/uL (ref 850–3900)
MCH: 28.3 pg (ref 27.0–33.0)
MCHC: 33.4 g/dL (ref 32.0–36.0)
MCV: 84.6 fL (ref 80.0–100.0)
MPV: 10.7 fL (ref 7.5–12.5)
Monocytes Absolute: 576 cells/uL (ref 200–950)
Monocytes Relative: 6 %
Neutro Abs: 6144 cells/uL (ref 1500–7800)
Neutrophils Relative %: 64 %
Platelets: 305 10*3/uL (ref 140–400)
RBC: 5.27 MIL/uL — ABNORMAL HIGH (ref 3.80–5.10)
RDW: 12.6 % (ref 11.0–15.0)
WBC: 9.6 10*3/uL (ref 3.8–10.8)

## 2016-07-09 LAB — POCT WET PREP (WET MOUNT): Trichomonas Wet Prep HPF POC: ABSENT

## 2016-07-09 LAB — BASIC METABOLIC PANEL
BUN: 15 mg/dL (ref 7–25)
CO2: 27 mmol/L (ref 20–31)
Calcium: 10.6 mg/dL — ABNORMAL HIGH (ref 8.6–10.4)
Chloride: 94 mmol/L — ABNORMAL LOW (ref 98–110)
Creat: 0.84 mg/dL (ref 0.50–0.99)
Glucose, Bld: 390 mg/dL — ABNORMAL HIGH (ref 65–99)
Potassium: 4.4 mmol/L (ref 3.5–5.3)
Sodium: 135 mmol/L (ref 135–146)

## 2016-07-09 MED ORDER — LIRAGLUTIDE 18 MG/3ML ~~LOC~~ SOPN: 0.6000 mg | PEN_INJECTOR | Freq: Every day | SUBCUTANEOUS | 0 refills | Status: DC

## 2016-07-09 MED ORDER — FLUCONAZOLE 150 MG PO TABS: 150.0000 mg | ORAL_TABLET | ORAL | 0 refills | Status: DC

## 2016-07-09 MED ORDER — INSULIN DETEMIR 100 UNIT/ML FLEXPEN: 10.0000 [IU] | PEN_INJECTOR | Freq: Every day | SUBCUTANEOUS | 0 refills | Status: DC

## 2016-07-09 MED ORDER — INSULIN ASPART 100 UNIT/ML IV SOLN: 5.0000 [IU] | Freq: Once | INTRAVENOUS | Status: DC

## 2016-07-09 NOTE — Progress Notes (Addendum)
Name: Dawn Werner   MRN: [id number]    DOB: [birth year]   Date:07/09/2016       Progress Note  Subjective  Chief Complaint  Chief Complaint  Patient presents with  . Vaginal Irritation    patient presents with vaginal irritation for a week. patient has tried some otc meds. burning upon urination    HPI  Vaginal irritation: she has a history of herpes outbreak in the past, but only one episode in the past, she states started with a sensation of a cut/scratch on left labia, she started Valtrex but a few days later it felt more irritated and swollen. She has tried otc suppositories, topical Zovirax and also Valtrex without help. She denies vaginal discharge, but has vulva irritation and urinary frequency.   DMII: she has not been taking medication, metformin caused diarrhea, and last hgbA1C was elevated on her last visit. Explained that vaginitis and urinary frequency may be caused by DM. She is having polydipsia and urinary frequency and nocturia 3-4 times per night over the past week. 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    Patient Active Problem List   Diagnosis Date Noted  . 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 Derangement of posterior horn of lateral meniscus of right knee 01/10/2016  . 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 Primary osteoarthritis of knee 01/24/2015  . Asthma, mild intermittent 11/18/2014  . Arteriosclerosis of coronary artery 11/18/2014  . Insomnia, persistent 11/18/2014  . Dyslipidemia 11/18/2014  . Acid reflux 11/18/2014  . Diabetes type 2, controlled (HCC) 11/18/2014  . Genital herpes in women 11/18/2014  . Hypertension 11/18/2014  . Asymptomatic postmenopausal status 11/18/2014  . History of acute myocardial infarction 11/18/2014    Past Surgical History:  Procedure Laterality Date  . ABDOMINAL HYSTERECTOMY    . BREAST SURGERY     breast reduction  . TUBAL LIGATION       Family History  Problem Relation Age of Onset  . Hyperlipidemia Mother   . Hypertension Mother   . Stroke Mother   . Dementia Father   . Cancer Sister     breast  . Asthma Brother   . Depression Brother     bipolar  . Heart disease Brother   . Heart disease Brother     Social History   Social History  . Marital status: Married    Spouse name: N/A  . Number of children: N/A  . Years of education: N/A   Occupational History  . Not on file.   Social History Main Topics  . Smoking status: Never Smoker  . Smokeless tobacco: Never Used  . Alcohol use No  . Drug use: No  . Sexual activity: Yes    Partners: Male   Other Topics Concern  . Not on file   Social History Narrative  . No narrative on file     Current Outpatient Prescriptions:  .  acyclovir ointment (ZOVIRAX) 5 %, Apply 1 application topically 2 (two) times daily as needed. Reported on 06/13/2015, Disp: , Rfl:  .  albuterol (PROVENTIL HFA) 108 (90 BASE) MCG/ACT inhaler, Inhale 2 puffs into the lungs as needed., Disp: , Rfl:  .  aspirin EC 81 MG tablet, Take 1 tablet by mouth daily., Disp: , Rfl:  .  carvedilol (COREG) 6.25 MG tablet, Take 1 tablet (6.25 mg total) by mouth 2 (two) times daily.,  Disp: 180 tablet, Rfl: 1 .  Coenzyme Q10 (CO Q 10) 100 MG CAPS, Take 1 capsule by mouth daily. (Patient not taking: Reported on 06/24/2016), Disp: 30 capsule, Rfl: 0 .  colchicine 0.6 MG tablet, Take 1 tablet (0.6 mg total) by mouth daily. Take two at onset of gout and repeat in 1 hour, prn (Patient not taking: Reported on 06/24/2016), Disp: 30 tablet, Rfl: 0 .  Elastic Bandages & Supports (TENNIS ELBOW NEOPRENE BRACE) MISC, 1 Units by Does not apply route daily. (Patient not taking: Reported on 06/24/2016), Disp: 1 each, Rfl: 0 .  fluconazole (DIFLUCAN) 150 MG tablet, Take 1 tablet (150 mg total) by mouth every other day., Disp: 3 tablet, Rfl: 0 .  fluticasone furoate-vilanterol (BREO ELLIPTA) 100-25 MCG/INH AEPB, Inhale 1  puff into the lungs daily. (Patient taking differently: Inhale 1 puff into the lungs daily. ), Disp: 60 each, Rfl: 0 .  Insulin Detemir (LEVEMIR FLEXTOUCH) 100 UNIT/ML Pen, Inject 10 Units into the skin daily at 10 pm., Disp: 15 mL, Rfl: 0 .  liraglutide (VICTOZA) 18 MG/3ML SOPN, Inject 0.1-0.3 mLs (0.6-1.8 mg total) into the skin daily., Disp: 9 mL, Rfl: 0 .  LORazepam (ATIVAN) 0.5 MG tablet, TAKE 1 TABLET BY MOUTH AT BEDTIME AS NEEDED FOR SLEEP, Disp: 30 tablet, Rfl: 0 .  losartan (COZAAR) 50 MG tablet, Take 1 tablet (50 mg total) by mouth daily., Disp: 90 tablet, Rfl: 1 .  rosuvastatin (CRESTOR) 40 MG tablet, TAKE 1 TABLET (40 MG TOTAL) BY MOUTH DAILY., Disp: 90 tablet, Rfl: 1 .  valACYclovir (VALTREX) 500 MG tablet, Take 1 tablet (500 mg total) by mouth daily. And twice daily for outbreaks, Disp: 100 tablet, Rfl: 4 .  zolpidem (AMBIEN) 10 MG tablet, TAKE 1 TABLET BY MOUTH AT BEDTIME, Disp: 90 tablet, Rfl: 0  Current Facility-Administered Medications:  .  insulin aspart (novoLOG) injection 5 Units, 5 Units, Intravenous, Once, Alba Cory, MD  Allergies  Allergen Reactions  . Metformin And Related Diarrhea    And nausea     ROS  Ten systems reviewed and is negative except as mentioned in HPI  Objective  Vitals:   07/09/16 1433  BP: 140/80  Pulse: (!) 104  Temp: 98.6 F (37 C)  TempSrc: Oral  SpO2: 94%  Weight: 162 lb 4.8 oz (73.6 kg)    Body mass index is 27.86 kg/m.  Physical Exam  Constitutional: Patient appears well-developed and well-nourished. Obese  No distress.  HEENT: head atraumatic, normocephalic, pupils equal and reactive to light,neck supple, throat within normal limits Cardiovascular: Normal rate, regular rhythm and normal heart sounds.  No murmur heard. No BLE edema. Pulmonary/Chest: Effort normal and breath sounds normal. No respiratory distress. Abdominal: Soft.  There is no tenderness. Gyn: she has some vulva excoriation, vaginal redness and vaginal  discharge - cottage like -  Psychiatric: Patient has a normal mood and affect. behavior is normal. Judgment and thought content normal.  Recent Results (from the past 2160 hour(s))  COMPLETE METABOLIC PANEL WITH GFR     Status: Abnormal   Collection Time: 05/24/16  9:36 AM  Result Value Ref Range   Sodium 140 135 - 146 mmol/L   Potassium 4.3 3.5 - 5.3 mmol/L   Chloride 101 98 - 110 mmol/L   CO2 27 20 - 31 mmol/L   Glucose, Bld 224 (H) 65 - 99 mg/dL   BUN 10 7 - 25 mg/dL   Creat 4.09 8.11 - 9.14 mg/dL    Comment:  For patients > or = 67 years of age: The upper reference limit for Creatinine is approximately 13% higher for people identified as African-American.      Total Bilirubin 0.4 0.2 - 1.2 mg/dL   Alkaline Phosphatase 101 33 - 130 U/L   AST 33 10 - 35 U/L   ALT 42 (H) 6 - 29 U/L   Total Protein 7.2 6.1 - 8.1 g/dL   Albumin 4.4 3.6 - 5.1 g/dL   Calcium 9.6 8.6 - 16.1 mg/dL   GFR, Est African American >89 >=60 mL/min   GFR, Est Non African American >89 >=60 mL/min  Lipid panel     Status: Abnormal   Collection Time: 05/24/16  9:36 AM  Result Value Ref Range   Cholesterol 178 <200 mg/dL   Triglycerides 096 <045 mg/dL   HDL 41 (L) >40 mg/dL   Total CHOL/HDL Ratio 4.3 <5.0 Ratio   VLDL 23 <30 mg/dL   LDL Cholesterol 981 (H) <100 mg/dL  Uric acid     Status: None   Collection Time: 05/24/16  9:36 AM  Result Value Ref Range   Uric Acid, Serum 5.1 2.5 - 7.0 mg/dL  Hemoglobin X9J     Status: Abnormal   Collection Time: 05/24/16  9:36 AM  Result Value Ref Range   Hgb A1c MFr Bld 7.8 (H) <5.7 %    Comment:   For someone without known diabetes, a hemoglobin A1c value of 6.5% or greater indicates that they may have diabetes and this should be confirmed with a follow-up test.   For someone with known diabetes, a value <7% indicates that their diabetes is well controlled and a value greater than or equal to 7% indicates suboptimal control. A1c targets should be  individualized based on duration of diabetes, age, comorbid conditions, and other considerations.   Currently, no consensus exists for use of hemoglobin A1c for diagnosis of diabetes for children.      Mean Plasma Glucose 177 mg/dL     YNW2/9: Depression screen [facility] 2/9 06/24/2016 01/10/2016 09/11/2015 06/13/2015 01/24/2015  Decreased Interest 0 0 0 0 0  Down, Depressed, Hopeless 0 0 0 0 0  PHQ - 2 Score 0 0 0 0 0     Fall Risk: Fall Risk  06/24/2016 01/10/2016 09/11/2015 06/13/2015 01/24/2015  Falls in the past year? No No No No No     Assessment & Plan  1. Vaginal irritation  - fluconazole (DIFLUCAN) 150 MG tablet; Take 1 tablet (150 mg total) by mouth every other day.  Dispense: 3 tablet; Refill: 0 -Wet prep  2. Urinary frequency  - Urine Culture  3. Type 2 diabetes mellitus with hyperglycemia, without long-term current use of insulin (HCC)  She will eliminate all sweet beverages and food from her diet until she sees me in 24-48 hours. Drink a lot of water, and increase potassium intake ( spinach/brocolli ), go to [facility] if no improvement of symptoms. She refuses going to [facility] right now - Insulin Detemir (LEVEMIR FLEXTOUCH) 100 UNIT/ML Pen; Inject 10 Units into the skin daily at 10 pm.  Dispense: 15 mL; Refill: 0 (10 unit sample given in office) - Amb ref to [facility] - liraglutide (VICTOZA) 18 MG/3ML SOPN; Inject 0.1-0.3 mLs (0.6-1.8 mg total) into the skin daily.  Dispense: 9 mL; Refill: 0 - insulin aspart (novoLOG) injection 5 Units; Inject 0.05 mLs (5 Units total)sample given in office  into the vein once. - Basic metabolic panel; Future -Glucose  -Urinalysis

## 2016-07-10 ENCOUNTER — Ambulatory Visit (INDEPENDENT_AMBULATORY_CARE_PROVIDER_SITE_OTHER): Payer: PPO | Admitting: Family Medicine

## 2016-07-10 ENCOUNTER — Encounter: Payer: Self-pay | Admitting: Family Medicine

## 2016-07-10 VITALS — BP 110/72 | HR 88 | Temp 98.0°F | Resp 16 | Wt 163.1 lb

## 2016-07-10 DIAGNOSIS — E1165 Type 2 diabetes mellitus with hyperglycemia: Secondary | ICD-10-CM | POA: Diagnosis not present

## 2016-07-10 LAB — POCT URINALYSIS DIPSTICK
Bilirubin, UA: NEGATIVE
Bilirubin, UA: NEGATIVE
Blood, UA: NEGATIVE
Glucose, UA: POSITIVE
Ketones, UA: POSITIVE
Leukocytes, UA: NEGATIVE
Leukocytes, UA: NEGATIVE
Nitrite, UA: NEGATIVE
Nitrite, UA: NEGATIVE
Spec Grav, UA: 1.02
Spec Grav, UA: 1.025
Urobilinogen, UA: NEGATIVE
Urobilinogen, UA: 0.2
pH, UA: 6.5
pH, UA: 6.5

## 2016-07-10 LAB — GLUCOSE, POCT (MANUAL RESULT ENTRY)
POC Glucose: 330 mg/dl — AB (ref 70–99)
POC Glucose: 427 mg/dl — AB (ref 70–99)

## 2016-07-10 MED ORDER — INSULIN PEN NEEDLE 32G X 6 MM MISC: 1.0000 | Freq: Two times a day (BID) | 1 refills | Status: DC

## 2016-07-10 NOTE — Progress Notes (Signed)
Name: Dawn Werner   MRN: [id number]    DOB: 1951-05-08   Date:07/10/2016       Progress Note  Subjective  Chief Complaint  Chief Complaint  Patient presents with  . Hyperglycemia    HPI  DMII follow up : seen yesterday and refused to go to [facility], she was given Levemir yesterday and a dose of Novolog in the office, she returned today for follow up and is feeling a little better. Only had to void twice during the night, vaginal irritation has improved, not feeling as dehydrated. She ate chicken and fish and salad last night, has been drinking water. No nausea, vomiting, chest pain, palpitation. 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    Patient Active Problem List   Diagnosis Date Noted  . 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 Derangement of posterior horn of lateral meniscus of right knee 01/10/2016  . 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 Primary osteoarthritis of knee 01/24/2015  . Asthma, mild intermittent 11/18/2014  . Arteriosclerosis of coronary artery 11/18/2014  . Insomnia, persistent 11/18/2014  . Dyslipidemia 11/18/2014  . Acid reflux 11/18/2014  . Diabetes type 2, controlled ([place]) 11/18/2014  . Genital herpes in women 11/18/2014  . Hypertension 11/18/2014  . Asymptomatic postmenopausal status 11/18/2014  . History of acute myocardial infarction 11/18/2014    Past Surgical History:  Procedure Laterality Date  . ABDOMINAL HYSTERECTOMY    . BREAST SURGERY     breast reduction  . TUBAL LIGATION      Family History  Problem Relation Age of Onset  . Hyperlipidemia Mother   . Hypertension Mother   . Stroke Mother   . Dementia Father   . Cancer Sister     breast  . Asthma Brother   . Depression Brother     bipolar  . Heart disease Brother   . Heart disease Brother     Social History   Social History  . Marital status: Married    Spouse name: N/A  . Number of  children: N/A  . Years of education: N/A   Occupational History  . Not on file.   Social History Main Topics  . Smoking status: Never Smoker  . Smokeless tobacco: Never Used  . Alcohol use No  . Drug use: No  . Sexual activity: Yes    Partners: Male   Other Topics Concern  . Not on file   Social History Narrative  . No narrative on file     Current Outpatient Prescriptions:  .  acyclovir ointment (ZOVIRAX) 5 %, Apply 1 application topically 2 (two) times daily as needed. Reported on 06/13/2015, Disp: , Rfl:  .  albuterol (PROVENTIL HFA) 108 (90 BASE) MCG/ACT inhaler, Inhale 2 puffs into the lungs as needed., Disp: , Rfl:  .  aspirin EC 81 MG tablet, Take 1 tablet by mouth daily., Disp: , Rfl:  .  carvedilol (COREG) 6.25 MG tablet, Take 1 tablet (6.25 mg total) by mouth 2 (two) times daily., Disp: 180 tablet, Rfl: 1 .  Coenzyme Q10 (CO Q 10) 100 MG CAPS, Take 1 capsule by mouth daily. (Patient not taking: Reported on 06/24/2016), Disp: 30 capsule, Rfl: 0 .  colchicine 0.6 MG tablet, Take 1 tablet (0.6 mg total) by mouth daily. Take two at onset of gout and repeat in 1 hour, prn (Patient not taking: Reported on  06/24/2016), Disp: 30 tablet, Rfl: 0 .  Elastic Bandages & Supports (TENNIS ELBOW NEOPRENE BRACE) MISC, 1 Units by Does not apply route daily. (Patient not taking: Reported on 06/24/2016), Disp: 1 each, Rfl: 0 .  fluconazole (DIFLUCAN) 150 MG tablet, Take 1 tablet (150 mg total) by mouth every other day., Disp: 3 tablet, Rfl: 0 .  fluticasone furoate-vilanterol (BREO ELLIPTA) 100-25 MCG/INH AEPB, Inhale 1 puff into the lungs daily. (Patient taking differently: Inhale 1 puff into the lungs daily. ), Disp: 60 each, Rfl: 0 .  Insulin Detemir (LEVEMIR FLEXTOUCH) 100 UNIT/ML Pen, Inject 10 Units into the skin daily at 10 pm., Disp: 15 mL, Rfl: 0 .  liraglutide (VICTOZA) 18 MG/3ML SOPN, Inject 0.1-0.3 mLs (0.6-1.8 mg total) into the skin daily., Disp: 9 mL, Rfl: 0 .  LORazepam (ATIVAN)  0.5 MG tablet, TAKE 1 TABLET BY MOUTH AT BEDTIME AS NEEDED FOR SLEEP, Disp: 30 tablet, Rfl: 0 .  losartan (COZAAR) 50 MG tablet, Take 1 tablet (50 mg total) by mouth daily., Disp: 90 tablet, Rfl: 1 .  rosuvastatin (CRESTOR) 40 MG tablet, TAKE 1 TABLET (40 MG TOTAL) BY MOUTH DAILY., Disp: 90 tablet, Rfl: 1 .  valACYclovir (VALTREX) 500 MG tablet, Take 1 tablet (500 mg total) by mouth daily. And twice daily for outbreaks, Disp: 100 tablet, Rfl: 4 .  zolpidem (AMBIEN) 10 MG tablet, TAKE 1 TABLET BY MOUTH AT BEDTIME, Disp: 90 tablet, Rfl: 0  Current Facility-Administered Medications:  .  insulin aspart (novoLOG) injection 5 Units, 5 Units, Intravenous, Once, Steele Sizer, MD  Allergies  Allergen Reactions  . Metformin And Related Diarrhea    And nausea     ROS  Ten systems reviewed and is negative except as mentioned in HPI   Objective  There were no vitals filed for this visit.  There is no height or weight on file to calculate BMI.  Physical Exam  Constitutional: Patient appears well-developed and well-nourished. Obese  No distress.  HEENT: head atraumatic, normocephalic, pupils equal and reactive to light, , neck supple, throat within normal limits Cardiovascular: Normal rate, regular rhythm and normal heart sounds.  No murmur heard. No BLE edema. Pulmonary/Chest: Effort normal and breath sounds normal. No respiratory distress. Abdominal: Soft.  There is no tenderness. Psychiatric: Patient has a normal mood and affect. behavior is normal. Judgment and thought content normal.  Recent Results (from the past 2160 hour(s))  COMPLETE METABOLIC PANEL WITH GFR     Status: Abnormal   Collection Time: 05/24/16  9:36 AM  Result Value Ref Range   Sodium 140 135 - 146 mmol/L   Potassium 4.3 3.5 - 5.3 mmol/L   Chloride 101 98 - 110 mmol/L   CO2 27 20 - 31 mmol/L   Glucose, Bld 224 (H) 65 - 99 mg/dL   BUN 10 7 - 25 mg/dL   Creat 0.70 0.50 - 0.99 mg/dL    Comment:   For patients > or  = 67 years of age: The upper reference limit for Creatinine is approximately 13% higher for people identified as African-American.      Total Bilirubin 0.4 0.2 - 1.2 mg/dL   Alkaline Phosphatase 101 33 - 130 U/L   AST 33 10 - 35 U/L   ALT 42 (H) 6 - 29 U/L   Total Protein 7.2 6.1 - 8.1 g/dL   Albumin 4.4 3.6 - 5.1 g/dL   Calcium 9.6 8.6 - 10.4 mg/dL   GFR, Est African American >89 >=60 mL/min  GFR, Est Non African American >89 >=60 mL/min  Lipid panel     Status: Abnormal   Collection Time: 05/24/16  9:36 AM  Result Value Ref Range   Cholesterol 178 <200 mg/dL   Triglycerides 117 <150 mg/dL   HDL 41 (L) >50 mg/dL   Total CHOL/HDL Ratio 4.3 <5.0 Ratio   VLDL 23 <30 mg/dL   LDL Cholesterol 114 (H) <100 mg/dL  Uric acid     Status: None   Collection Time: 05/24/16  9:36 AM  Result Value Ref Range   Uric Acid, Serum 5.1 2.5 - 7.0 mg/dL  Hemoglobin A1c     Status: Abnormal   Collection Time: 05/24/16  9:36 AM  Result Value Ref Range   Hgb A1c MFr Bld 7.8 (H) <5.7 %    Comment:   For someone without known diabetes, a hemoglobin A1c value of 6.5% or greater indicates that they may have diabetes and this should be confirmed with a follow-up test.   For someone with known diabetes, a value <7% indicates that their diabetes is well controlled and a value greater than or equal to 7% indicates suboptimal control. A1c targets should be individualized based on duration of diabetes, age, comorbid conditions, and other considerations.   Currently, no consensus exists for use of hemoglobin A1c for diagnosis of diabetes for children.      Mean Plasma Glucose 177 mg/dL  CBC with Differential/Platelet     Status: Abnormal   Collection Time: 07/09/16  3:52 PM  Result Value Ref Range   WBC 9.6 3.8 - 10.8 K/uL   RBC 5.27 (H) 3.80 - 5.10 MIL/uL   Hemoglobin 14.9 11.7 - 15.5 g/dL   HCT 44.6 35.0 - 45.0 %   MCV 84.6 80.0 - 100.0 fL   MCH 28.3 27.0 - 33.0 pg   MCHC 33.4 32.0 - 36.0 g/dL    RDW 12.6 11.0 - 15.0 %   Platelets 305 140 - 400 K/uL   MPV 10.7 7.5 - 12.5 fL   Neutro Abs 6,144 1,500 - 7,800 cells/uL   Lymphs Abs 2,688 850 - 3,900 cells/uL   Monocytes Absolute 576 200 - 950 cells/uL   Eosinophils Absolute 96 15 - 500 cells/uL   Basophils Absolute 96 0 - 200 cells/uL   Neutrophils Relative % 64 %   Lymphocytes Relative 28 %   Monocytes Relative 6 %   Eosinophils Relative 1 %   Basophils Relative 1 %   Smear Review Criteria for review not met   Basic metabolic panel     Status: Abnormal   Collection Time: 07/09/16  3:54 PM  Result Value Ref Range   Sodium 135 135 - 146 mmol/L   Potassium 4.4 3.5 - 5.3 mmol/L   Chloride 94 (L) 98 - 110 mmol/L   CO2 27 20 - 31 mmol/L   Glucose, Bld 390 (H) 65 - 99 mg/dL   BUN 15 7 - 25 mg/dL   Creat 0.84 0.50 - 0.99 mg/dL    Comment:   For patients > or = 67 years of age: The upper reference limit for Creatinine is approximately 13% higher for people identified as African-American.      Calcium 10.6 (H) 8.6 - 10.4 mg/dL  POCT Wet Prep [facility])     Status: Abnormal   Collection Time: 07/09/16  5:12 PM  Result Value Ref Range   Source Wet Prep POC vaginal    WBC, Wet Prep HPF POC few  Bacteria Wet Prep HPF POC None (A) Few   BACTERIA WET PREP MORPHOLOGY POC     Clue Cells Wet Prep HPF POC Few (A) None   Clue Cells Wet Prep Whiff POC     Yeast Wet Prep HPF POC Many    KOH Wet Prep POC     Trichomonas Wet Prep HPF POC Absent Absent      PHQ2/9: Depression screen [facility] 2/9 06/24/2016 01/10/2016 09/11/2015 06/13/2015 01/24/2015  Decreased Interest 0 0 0 0 0  Down, Depressed, Hopeless 0 0 0 0 0  PHQ - 2 Score 0 0 0 0 0     Fall Risk: Fall Risk  06/24/2016 01/10/2016 09/11/2015 06/13/2015 01/24/2015  Falls in the past year? No No No No No      Assessment & Plan  1. Type 2 diabetes mellitus with hyperglycemia, without long-term current use of insulin (HCC)  She is doing better, we gave her another dose of Levemir 15  units in our office today and also Victoza 0.6 . She was instructed before she left. She will call us tomorrow with her glucose levels. She will continue to drink water and avoid carbohydrates.  - Insulin Pen Needle (NOVOFINE) 32G X 6 MM MISC; 1 each by Does not apply route 2 (two) times daily.  Dispense: 200 each; Refill: 1 - POCT Glucose (CBG) - POCT Urinalysis Dipstick

## 2016-07-10 NOTE — Patient Instructions (Signed)
Victoza 0.6 mg for the first week, 1.2 after that  Levemir : 16 units daily and increase by 2 units every 3 days to keep fasting sugar (  Morning sugar ) between 120-140.

## 2016-07-10 NOTE — Addendum Note (Signed)
Addended by: Alba CorySOWLES, Vyncent Overby F on: 07/10/2016 08:06 AM   Modules accepted: Orders

## 2016-07-11 ENCOUNTER — Telehealth: Payer: Self-pay

## 2016-07-11 LAB — URINE CULTURE

## 2016-07-11 NOTE — Telephone Encounter (Signed)
Per Dr. Carlynn PurlSowles,  I contacted this patient to see how she was doing, but there was no answer. A message was left for her to give us a call when she got the chance.

## 2016-07-18 ENCOUNTER — Encounter: Payer: Self-pay | Admitting: Family Medicine

## 2016-07-18 ENCOUNTER — Ambulatory Visit (INDEPENDENT_AMBULATORY_CARE_PROVIDER_SITE_OTHER): Payer: PPO | Admitting: Family Medicine

## 2016-07-18 VITALS — BP 118/74 | HR 91 | Temp 97.9°F | Resp 18 | Ht 64.0 in | Wt 164.8 lb

## 2016-07-18 DIAGNOSIS — N898 Other specified noninflammatory disorders of vagina: Secondary | ICD-10-CM

## 2016-07-18 DIAGNOSIS — E1165 Type 2 diabetes mellitus with hyperglycemia: Secondary | ICD-10-CM | POA: Diagnosis not present

## 2016-07-18 NOTE — Patient Instructions (Addendum)
Levemir only works on your fasting levels ( morning sugar ), you can go up by 2 units every 3 days until morning sugar stays between 100-140.  You will need to go down by 2 units every 3 days if your sugar goes down below 100 on a regular

## 2016-07-18 NOTE — Progress Notes (Signed)
Name: Dawn Werner   MRN: [id number]    DOB: 07-May-1951   Date:07/18/2016       Progress Note  Subjective  Chief Complaint  Chief Complaint  Patient presents with  . Follow-up    1 week   . Diabetes    Doing well with dosages, has not drank anything sweet, BS is starting to come down- checks morning fasting the past week Low-126 Average-150's-200 Highest-282. Still having some blurry vision but better than it was before.     HPI  DMII: she is on Levemir 20 units daily and Victoza 1.8 daily, her glucose has improved , lowest glucose was last night at 126, this morning it was 136. She is doing well. No side effects from medication. Blurred vision has improved. She feels comfortable giving herself the injections.    Patient Active Problem List   Diagnosis Date Noted  . 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 Derangement of posterior horn of lateral meniscus of right knee 01/10/2016  . 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 Primary osteoarthritis of knee 01/24/2015  . Asthma, mild intermittent 11/18/2014  . Arteriosclerosis of coronary artery 11/18/2014  . Insomnia, persistent 11/18/2014  . Dyslipidemia 11/18/2014  . Acid reflux 11/18/2014  . Diabetes type 2, controlled ([place]) 11/18/2014  . Genital herpes in women 11/18/2014  . Hypertension 11/18/2014  . Asymptomatic postmenopausal status 11/18/2014  . History of acute myocardial infarction 11/18/2014    Past Surgical History:  Procedure Laterality Date  . ABDOMINAL HYSTERECTOMY    . BREAST SURGERY     breast reduction  . TUBAL LIGATION      Family History  Problem Relation Age of Onset  . Hyperlipidemia Mother   . Hypertension Mother   . Stroke Mother   . Dementia Father   . Cancer Sister     breast  . Asthma Brother   . Depression Brother     bipolar  . Heart disease Brother   . Heart disease Brother     Social History   Social History  . Marital status: Married    Spouse name: N/A  . Number of children: N/A  .  Years of education: N/A   Occupational History  . Not on file.   Social History Main Topics  . Smoking status: Never Smoker  . Smokeless tobacco: Never Used  . Alcohol use No  . Drug use: No  . Sexual activity: Yes    Partners: Male   Other Topics Concern  . Not on file   Social History Narrative  . No narrative on file     Current Outpatient Prescriptions:  .  acyclovir ointment (ZOVIRAX) 5 %, Apply 1 application topically 2 (two) times daily as needed. Reported on 06/13/2015, Disp: , Rfl:  .  albuterol (PROVENTIL HFA) 108 (90 BASE) MCG/ACT inhaler, Inhale 2 puffs into the lungs as needed., Disp: , Rfl:  .  aspirin EC 81 MG tablet, Take 1 tablet by mouth daily., Disp: , Rfl:  .  carvedilol (COREG) 6.25 MG tablet, Take 1 tablet (6.25 mg total) by mouth 2 (two) times daily., Disp: 180 tablet, Rfl: 1 .  Coenzyme Q10 (CO Q 10) 100 MG CAPS, Take 1 capsule by mouth daily., Disp: 30 capsule, Rfl: 0 .  colchicine 0.6 MG tablet, Take 1 tablet (0.6 mg total) by mouth daily. Take two at onset of gout and repeat in 1 hour, prn, Disp: 30 tablet, Rfl: 0 .  Elastic Bandages & Supports (TENNIS ELBOW NEOPRENE  BRACE) MISC, 1 Units by Does not apply route daily., Disp: 1 each, Rfl: 0 .  fluconazole (DIFLUCAN) 150 MG tablet, Take 1 tablet (150 mg total) by mouth every other day., Disp: 3 tablet, Rfl: 0 .  fluticasone furoate-vilanterol (BREO ELLIPTA) 100-25 MCG/INH AEPB, Inhale 1 puff into the lungs daily. (Patient taking differently: Inhale 1 puff into the lungs daily. ), Disp: 60 each, Rfl: 0 .  Insulin Detemir (LEVEMIR FLEXTOUCH) 100 UNIT/ML Pen, Inject 10 Units into the skin daily at 10 pm., Disp: 15 mL, Rfl: 0 .  Insulin Pen Needle (NOVOFINE) 32G X 6 MM MISC, 1 each by Does not apply route 2 (two) times daily., Disp: 200 each, Rfl: 1 .  liraglutide (VICTOZA) 18 MG/3ML SOPN, Inject 0.1-0.3 mLs (0.6-1.8 mg total) into the skin daily., Disp: 9 mL, Rfl: 0 .  LORazepam (ATIVAN) 0.5 MG tablet, TAKE 1  TABLET BY MOUTH AT BEDTIME AS NEEDED FOR SLEEP, Disp: 30 tablet, Rfl: 0 .  losartan (COZAAR) 50 MG tablet, Take 1 tablet (50 mg total) by mouth daily., Disp: 90 tablet, Rfl: 1 .  rosuvastatin (CRESTOR) 40 MG tablet, TAKE 1 TABLET (40 MG TOTAL) BY MOUTH DAILY., Disp: 90 tablet, Rfl: 1 .  valACYclovir (VALTREX) 500 MG tablet, Take 1 tablet (500 mg total) by mouth daily. And twice daily for outbreaks, Disp: 100 tablet, Rfl: 4 .  zolpidem (AMBIEN) 10 MG tablet, TAKE 1 TABLET BY MOUTH AT BEDTIME, Disp: 90 tablet, Rfl: 0  Current Facility-Administered Medications:  .  insulin aspart (novoLOG) injection 5 Units, 5 Units, Intravenous, Once, Steele Sizer, MD  Allergies  Allergen Reactions  . Metformin And Related Diarrhea    And nausea     ROS  Ten systems reviewed and is negative except as mentioned in HPI Blurred vision has improved, feeling better  Objective  Vitals:   07/18/16 1355  BP: 118/74  Pulse: 91  Resp: 18  Temp: 97.9 F (36.6 C)  TempSrc: Oral  SpO2: 97%  Weight: 164 lb 12.8 oz (74.8 kg)  Height: 5' 4"  (1.626 m)    Body mass index is 28.29 kg/m.  Physical Exam  Constitutional: Patient appears well-developed and well-nourished. Obese  No distress.  HEENT: head atraumatic, normocephalic, pupils equal and reactive to light,neck supple, throat within normal limits Cardiovascular: Normal rate, regular rhythm and normal heart sounds.  No murmur heard. No BLE edema. Pulmonary/Chest: Effort normal and breath sounds normal. No respiratory distress. Abdominal: Soft.  There is no tenderness. Psychiatric: Patient has a normal mood and affect. behavior is normal. Judgment and thought content normal.   Recent Results (from the past 2160 hour(s))  COMPLETE METABOLIC PANEL WITH GFR     Status: Abnormal   Collection Time: 05/24/16  9:36 AM  Result Value Ref Range   Sodium 140 135 - 146 mmol/L   Potassium 4.3 3.5 - 5.3 mmol/L   Chloride 101 98 - 110 mmol/L   CO2 27 20 - 31  mmol/L   Glucose, Bld 224 (H) 65 - 99 mg/dL   BUN 10 7 - 25 mg/dL   Creat 0.70 0.50 - 0.99 mg/dL    Comment:   For patients > or = 67 years of age: The upper reference limit for Creatinine is approximately 13% higher for people identified as African-American.      Total Bilirubin 0.4 0.2 - 1.2 mg/dL   Alkaline Phosphatase 101 33 - 130 U/L   AST 33 10 - 35 U/L   ALT 42 (  H) 6 - 29 U/L   Total Protein 7.2 6.1 - 8.1 g/dL   Albumin 4.4 3.6 - 5.1 g/dL   Calcium 9.6 8.6 - 10.4 mg/dL   GFR, Est African American >89 >=60 mL/min   GFR, Est Non African American >89 >=60 mL/min  Lipid panel     Status: Abnormal   Collection Time: 05/24/16  9:36 AM  Result Value Ref Range   Cholesterol 178 <200 mg/dL   Triglycerides 117 <150 mg/dL   HDL 41 (L) >50 mg/dL   Total CHOL/HDL Ratio 4.3 <5.0 Ratio   VLDL 23 <30 mg/dL   LDL Cholesterol 114 (H) <100 mg/dL  Uric acid     Status: None   Collection Time: 05/24/16  9:36 AM  Result Value Ref Range   Uric Acid, Serum 5.1 2.5 - 7.0 mg/dL  Hemoglobin A1c     Status: Abnormal   Collection Time: 05/24/16  9:36 AM  Result Value Ref Range   Hgb A1c MFr Bld 7.8 (H) <5.7 %    Comment:   For someone without known diabetes, a hemoglobin A1c value of 6.5% or greater indicates that they may have diabetes and this should be confirmed with a follow-up test.   For someone with known diabetes, a value <7% indicates that their diabetes is well controlled and a value greater than or equal to 7% indicates suboptimal control. A1c targets should be individualized based on duration of diabetes, age, comorbid conditions, and other considerations.   Currently, no consensus exists for use of hemoglobin A1c for diagnosis of diabetes for children.      Mean Plasma Glucose 177 mg/dL  POCT Urinalysis Dipstick     Status: Abnormal   Collection Time: 07/09/16  2:37 PM  Result Value Ref Range   Color, UA yellow    Clarity, UA cloudy    Glucose, UA positive     Bilirubin, UA neg    Ketones, UA positive    Spec Grav, UA 1.020    Blood, UA neg    pH, UA 6.5    Protein, UA trace    Urobilinogen, UA negative    Nitrite, UA neg    Leukocytes, UA Negative Negative  CBC with Differential/Platelet     Status: Abnormal   Collection Time: 07/09/16  3:52 PM  Result Value Ref Range   WBC 9.6 3.8 - 10.8 K/uL   RBC 5.27 (H) 3.80 - 5.10 MIL/uL   Hemoglobin 14.9 11.7 - 15.5 g/dL   HCT 44.6 35.0 - 45.0 %   MCV 84.6 80.0 - 100.0 fL   MCH 28.3 27.0 - 33.0 pg   MCHC 33.4 32.0 - 36.0 g/dL   RDW 12.6 11.0 - 15.0 %   Platelets 305 140 - 400 K/uL   MPV 10.7 7.5 - 12.5 fL   Neutro Abs 6,144 1,500 - 7,800 cells/uL   Lymphs Abs 2,688 850 - 3,900 cells/uL   Monocytes Absolute 576 200 - 950 cells/uL   Eosinophils Absolute 96 15 - 500 cells/uL   Basophils Absolute 96 0 - 200 cells/uL   Neutrophils Relative % 64 %   Lymphocytes Relative 28 %   Monocytes Relative 6 %   Eosinophils Relative 1 %   Basophils Relative 1 %   Smear Review Criteria for review not met   Basic metabolic panel     Status: Abnormal   Collection Time: 07/09/16  3:54 PM  Result Value Ref Range   Sodium 135 135 - 146 mmol/L  Potassium 4.4 3.5 - 5.3 mmol/L   Chloride 94 (L) 98 - 110 mmol/L   CO2 27 20 - 31 mmol/L   Glucose, Bld 390 (H) 65 - 99 mg/dL   BUN 15 7 - 25 mg/dL   Creat 0.84 0.50 - 0.99 mg/dL    Comment:   For patients > or = 67 years of age: The upper reference limit for Creatinine is approximately 13% higher for people identified as African-American.      Calcium 10.6 (H) 8.6 - 10.4 mg/dL  Urine Culture     Status: None   Collection Time: 07/09/16  4:05 PM  Result Value Ref Range   Organism ID, Bacteria      Three or more organisms present,each greater than 10,000 CFU/mL.These organisms,commonly found on external and internal genitalia,are considered to be colonizers.No further testing performed.   POCT Wet Prep [facility] [place])     Status: Abnormal   Collection Time:  07/09/16  5:12 PM  Result Value Ref Range   Source Wet Prep POC vaginal    WBC, Wet Prep HPF POC few    Bacteria Wet Prep HPF POC None (A) Few   BACTERIA WET PREP MORPHOLOGY POC     Clue Cells Wet Prep HPF POC Few (A) None   Clue Cells Wet Prep Whiff POC     Yeast Wet Prep HPF POC Many    KOH Wet Prep POC     Trichomonas Wet Prep HPF POC Absent Absent  POCT Urinalysis Dipstick     Status: Abnormal   Collection Time: 07/10/16  7:58 AM  Result Value Ref Range   Color, UA YELLOW    Clarity, UA CLEAR    Glucose, UA 2+    Bilirubin, UA NEGATIVE    Ketones, UA LARGE    Spec Grav, UA 1.025    Blood, UA HEMOLYZED TRACE    pH, UA 6.5    Protein, UA TRACE    Urobilinogen, UA 0.2    Nitrite, UA NEGATIVE    Leukocytes, UA Negative Negative  POCT Glucose (CBG)     Status: Abnormal   Collection Time: 07/10/16  7:59 AM  Result Value Ref Range   POC Glucose 330 (A) 70 - 99 mg/dl  POCT Glucose (CBG)     Status: Abnormal   Collection Time: 07/10/16  8:09 AM  Result Value Ref Range   POC Glucose 427 (A) 70 - 99 mg/dl      PHQ2/9: Depression screen [facility] 2/9 06/24/2016 01/10/2016 09/11/2015 06/13/2015 01/24/2015  Decreased Interest 0 0 0 0 0  Down, Depressed, Hopeless 0 0 0 0 0  PHQ - 2 Score 0 0 0 0 0     Fall Risk: Fall Risk  06/24/2016 01/10/2016 09/11/2015 06/13/2015 01/24/2015  Falls in the past year? No No No No No     Assessment & Plan  1. Type 2 diabetes mellitus with hyperglycemia, without long-term current use of insulin (HCC)  Doing well, discussed how to titrate Levemir up and down and return for regular glucose level   2. Vaginal irritation  resolved

## 2016-07-22 ENCOUNTER — Ambulatory Visit: Payer: PPO | Admitting: Dietician

## 2016-07-26 ENCOUNTER — Ambulatory Visit
Admission: RE | Admit: 2016-07-26 | Discharge: 2016-07-26 | Disposition: A | Discharge status: Home / Self Care | Payer: PPO | Source: Ambulatory Visit | Attending: Family Medicine | Admitting: Family Medicine

## 2016-07-26 DIAGNOSIS — Z1231 Encounter for screening mammogram for malignant neoplasm of breast: Secondary | ICD-10-CM | POA: Diagnosis not present

## 2016-08-02 DIAGNOSIS — E119 Type 2 diabetes mellitus without complications: Secondary | ICD-10-CM | POA: Diagnosis not present

## 2016-08-04 ENCOUNTER — Other Ambulatory Visit: Payer: Self-pay | Admitting: Family Medicine

## 2016-08-04 DIAGNOSIS — A6009 Herpesviral infection of other urogenital tract: Secondary | ICD-10-CM

## 2016-08-05 ENCOUNTER — Other Ambulatory Visit: Payer: Self-pay | Admitting: Family Medicine

## 2016-08-05 DIAGNOSIS — E1165 Type 2 diabetes mellitus with hyperglycemia: Secondary | ICD-10-CM

## 2016-08-05 NOTE — Telephone Encounter (Signed)
Patient requesting refill of Victoza to [facility]. 

## 2016-09-04 ENCOUNTER — Ambulatory Visit (INDEPENDENT_AMBULATORY_CARE_PROVIDER_SITE_OTHER): Payer: PPO | Admitting: Family Medicine

## 2016-09-04 ENCOUNTER — Encounter: Payer: Self-pay | Admitting: Family Medicine

## 2016-09-04 VITALS — HR 75 | Temp 97.8°F | Resp 16 | Ht 64.0 in | Wt 169.2 lb

## 2016-09-04 DIAGNOSIS — E785 Hyperlipidemia, unspecified: Secondary | ICD-10-CM

## 2016-09-04 DIAGNOSIS — E1140 Type 2 diabetes mellitus with diabetic neuropathy, unspecified: Secondary | ICD-10-CM

## 2016-09-04 DIAGNOSIS — E1165 Type 2 diabetes mellitus with hyperglycemia: Secondary | ICD-10-CM

## 2016-09-04 DIAGNOSIS — I10 Essential (primary) hypertension: Secondary | ICD-10-CM | POA: Diagnosis not present

## 2016-09-04 DIAGNOSIS — IMO0002 Reserved for concepts with insufficient information to code with codable children: Secondary | ICD-10-CM

## 2016-09-04 NOTE — Progress Notes (Signed)
Name: Dawn Werner   MRN: [id number]    DOB: Mar 17, 1951   Date:09/04/2016       Progress Note  Subjective  Chief Complaint  Chief Complaint  Patient presents with  . Annual Exam  . Diabetes    HPI  DMII: she has not been taking medication, metformin caused diarrhea, and last hgbA1C was elevated on her last visit. She was seen 06/2016 with polydipsia, blurred vision, vaginitis. Glucose was above 300 in our office, we started her on Levemir and Victoza, patient changed her diet. She states glucose started to drop and she is off Levemir, glucose at home usually 120's fasting. Highest glucose over the past few weeks was 164. She has gone down on Victoza and is not having any problems, she would like to come off medication. She had a normal eye exam.   Patient Active Problem List   Diagnosis Date Noted  . 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 Derangement of posterior horn of lateral meniscus of right knee 01/10/2016  . 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 Primary osteoarthritis of knee 01/24/2015  . Asthma, mild intermittent 11/18/2014  . Arteriosclerosis of coronary artery 11/18/2014  . Insomnia, persistent 11/18/2014  . Dyslipidemia 11/18/2014  . Acid reflux 11/18/2014  . Diabetes type 2, controlled ([place]) 11/18/2014  . Genital herpes in women 11/18/2014  . Hypertension 11/18/2014  . Asymptomatic postmenopausal status 11/18/2014  . History of acute myocardial infarction 11/18/2014    Past Surgical History:  Procedure Laterality Date  . ABDOMINAL HYSTERECTOMY    . BREAST SURGERY     breast reduction  . REDUCTION MAMMAPLASTY Bilateral 1995  . TUBAL LIGATION      Family History  Problem Relation Age of Onset  . Hyperlipidemia Mother   . Hypertension Mother   . Stroke Mother   . Dementia Father   . Cancer Sister     breast  . Breast cancer Sister 35  . Asthma Brother   . Depression Brother     bipolar  . Heart disease Brother   . Heart disease Brother     Social History    Social History  . Marital status: Married    Spouse name: N/A  . Number of children: N/A  . Years of education: N/A   Occupational History  . Not on file.   Social History Main Topics  . Smoking status: Never Smoker  . Smokeless tobacco: Never Used  . Alcohol use No  . Drug use: No  . Sexual activity: Yes    Partners: Male   Other Topics Concern  . Not on file   Social History Narrative  . No narrative on file     Current Outpatient Prescriptions:  .  acyclovir ointment (ZOVIRAX) 5 %, Apply 1 application topically 2 (two) times daily as needed. Reported on 06/13/2015, Disp: , Rfl:  .  albuterol (PROVENTIL HFA) 108 (90 BASE) MCG/ACT inhaler, Inhale 2 puffs into the lungs as needed., Disp: , Rfl:  .  aspirin EC 81 MG tablet, Take 1 tablet by mouth daily., Disp: , Rfl:  .  carvedilol (COREG) 6.25 MG tablet, Take 1 tablet (6.25 mg total) by mouth 2 (two) times daily., Disp: 180 tablet, Rfl: 1 .  colchicine 0.6 MG tablet, Take 1 tablet (0.6 mg total) by mouth daily. Take two at onset of gout and repeat in 1 hour, prn, Disp: 30 tablet, Rfl: 0 .  Elastic Bandages & Supports (TENNIS ELBOW NEOPRENE BRACE) MISC, 1 Units by Does  not apply route daily., Disp: 1 each, Rfl: 0 .  fluconazole (DIFLUCAN) 150 MG tablet, Take 1 tablet (150 mg total) by mouth every other day., Disp: 3 tablet, Rfl: 0 .  Insulin Pen Needle (NOVOFINE) 32G X 6 MM MISC, 1 each by Does not apply route 2 (two) times daily., Disp: 200 each, Rfl: 1 .  LORazepam (ATIVAN) 0.5 MG tablet, TAKE 1 TABLET BY MOUTH AT BEDTIME AS NEEDED FOR SLEEP, Disp: 30 tablet, Rfl: 0 .  losartan (COZAAR) 50 MG tablet, Take 1 tablet (50 mg total) by mouth daily., Disp: 90 tablet, Rfl: 1 .  rosuvastatin (CRESTOR) 40 MG tablet, TAKE 1 TABLET (40 MG TOTAL) BY MOUTH DAILY., Disp: 90 tablet, Rfl: 1 .  valACYclovir (VALTREX) 500 MG tablet, TAKE 1 TABLET (500 MG TOTAL) BY MOUTH DAILY., Disp: 90 tablet, Rfl: 1 .  VICTOZA 18 MG/3ML SOPN, INJECT 0.1-0.3  MLS (0.6-1.8 MG TOTAL) INTO THE SKIN DAILY., Disp: 9 mL, Rfl: 2 .  zolpidem (AMBIEN) 10 MG tablet, TAKE 1 TABLET BY MOUTH AT BEDTIME, Disp: 90 tablet, Rfl: 0  Allergies  Allergen Reactions  . Metformin And Related Diarrhea    And nausea     ROS  Constitutional: Negative for fever or weight change.  Respiratory: Negative for cough and shortness of breath.   Cardiovascular: Negative for chest pain or palpitations.  Gastrointestinal: Negative for abdominal pain, no bowel changes.  Musculoskeletal: Negative for gait problem or joint swelling.  Skin: Negative for rash.  Neurological: Negative for dizziness or headache.  No other specific complaints in a complete review of systems (except as listed in HPI above).  Objective  Vitals:   09/04/16 1120  Pulse: 75  Resp: 16  Temp: 97.8 F (36.6 C)  TempSrc: Oral  SpO2: 98%  Weight: 169 lb 3.2 oz (76.7 kg)  Height: 5' 4"  (1.626 m)    Body mass index is 29.04 kg/m.  Physical Exam  Constitutional: Patient appears well-developed and obese. No distress.  HENT: Head: Normocephalic and atraumatic. Ears: B TMs ok, no erythema or effusion; Nose: Nose normal. Mouth/Throat: Oropharynx is clear and moist. No oropharyngeal exudate.  Eyes: Conjunctivae and EOM are normal. Pupils are equal, round, and reactive to light. No scleral icterus.  Neck: Normal range of motion. Neck supple. No JVD present. No thyromegaly present.  Cardiovascular: Normal rate, regular rhythm and normal heart sounds.  No murmur heard. No BLE edema. Pulmonary/Chest: Effort normal and breath sounds normal. No respiratory distress. Abdominal: Soft. Bowel sounds are normal, no distension. There is no tenderness. no masses Breast: no lumps or masses, no nipple discharge or rashes FEMALE GENITALIA:  External genitalia normal External urethra normal Pelvic not done RECTAL: not done Musculoskeletal: Normal range of motion, no joint effusions. No gross  deformities Neurological: he is alert and oriented to person, place, and time. No cranial nerve deficit. Coordination, balance, strength, speech and gait are normal.  Skin: Skin is warm and dry. No rash noted. No erythema.  Psychiatric: Patient has a normal mood and affect. behavior is normal. Judgment and thought content normal.  Recent Results (from the past 2160 hour(s))  POCT Urinalysis Dipstick     Status: Abnormal   Collection Time: 07/09/16  2:37 PM  Result Value Ref Range   Color, UA yellow    Clarity, UA cloudy    Glucose, UA positive    Bilirubin, UA neg    Ketones, UA positive    Spec Grav, UA 1.020    Blood,  UA neg    pH, UA 6.5    Protein, UA trace    Urobilinogen, UA negative    Nitrite, UA neg    Leukocytes, UA Negative Negative  CBC with Differential/Platelet     Status: Abnormal   Collection Time: 07/09/16  3:52 PM  Result Value Ref Range   WBC 9.6 3.8 - 10.8 K/uL   RBC 5.27 (H) 3.80 - 5.10 MIL/uL   Hemoglobin 14.9 11.7 - 15.5 g/dL   HCT 44.6 35.0 - 45.0 %   MCV 84.6 80.0 - 100.0 fL   MCH 28.3 27.0 - 33.0 pg   MCHC 33.4 32.0 - 36.0 g/dL   RDW 12.6 11.0 - 15.0 %   Platelets 305 140 - 400 K/uL   MPV 10.7 7.5 - 12.5 fL   Neutro Abs 6,144 1,500 - 7,800 cells/uL   Lymphs Abs 2,688 850 - 3,900 cells/uL   Monocytes Absolute 576 200 - 950 cells/uL   Eosinophils Absolute 96 15 - 500 cells/uL   Basophils Absolute 96 0 - 200 cells/uL   Neutrophils Relative % 64 %   Lymphocytes Relative 28 %   Monocytes Relative 6 %   Eosinophils Relative 1 %   Basophils Relative 1 %   Smear Review Criteria for review not met   Basic metabolic panel     Status: Abnormal   Collection Time: 07/09/16  3:54 PM  Result Value Ref Range   Sodium 135 135 - 146 mmol/L   Potassium 4.4 3.5 - 5.3 mmol/L   Chloride 94 (L) 98 - 110 mmol/L   CO2 27 20 - 31 mmol/L   Glucose, Bld 390 (H) 65 - 99 mg/dL   BUN 15 7 - 25 mg/dL   Creat 0.84 0.50 - 0.99 mg/dL    Comment:   For patients > or = 67  years of age: The upper reference limit for Creatinine is approximately 13% higher for people identified as African-American.      Calcium 10.6 (H) 8.6 - 10.4 mg/dL  Urine Culture     Status: None   Collection Time: 07/09/16  4:05 PM  Result Value Ref Range   Organism ID, Bacteria      Three or more organisms present,each greater than 10,000 CFU/mL.These organisms,commonly found on external and internal genitalia,are considered to be colonizers.No further testing performed.   POCT Wet Prep [facility] [place])     Status: Abnormal   Collection Time: 07/09/16  5:12 PM  Result Value Ref Range   Source Wet Prep POC vaginal    WBC, Wet Prep HPF POC few    Bacteria Wet Prep HPF POC None (A) Few   BACTERIA WET PREP MORPHOLOGY POC     Clue Cells Wet Prep HPF POC Few (A) None   Clue Cells Wet Prep Whiff POC     Yeast Wet Prep HPF POC Many    KOH Wet Prep POC     Trichomonas Wet Prep HPF POC Absent Absent  POCT Urinalysis Dipstick     Status: Abnormal   Collection Time: 07/10/16  7:58 AM  Result Value Ref Range   Color, UA YELLOW    Clarity, UA CLEAR    Glucose, UA 2+    Bilirubin, UA NEGATIVE    Ketones, UA LARGE    Spec Grav, UA 1.025    Blood, UA HEMOLYZED TRACE    pH, UA 6.5    Protein, UA TRACE    Urobilinogen, UA 0.2    Nitrite,  UA NEGATIVE    Leukocytes, UA Negative Negative  POCT Glucose (CBG)     Status: Abnormal   Collection Time: 07/10/16  7:59 AM  Result Value Ref Range   POC Glucose 330 (A) 70 - 99 mg/dl  POCT Glucose (CBG)     Status: Abnormal   Collection Time: 07/10/16  8:09 AM  Result Value Ref Range   POC Glucose 427 (A) 70 - 99 mg/dl      PHQ2/9: Depression screen [facility] 2/9 09/04/2016 06/24/2016 01/10/2016 09/11/2015 06/13/2015  Decreased Interest 0 0 0 0 0  Down, Depressed, Hopeless 0 0 0 0 0  PHQ - 2 Score 0 0 0 0 0    Fall Risk: Fall Risk  09/04/2016 06/24/2016 01/10/2016 09/11/2015 06/13/2015  Falls in the past year? No No No No No     Functional Status  Survey: Is the patient deaf or have difficulty hearing?: No Does the patient have difficulty seeing, even when wearing glasses/contacts?: No Does the patient have difficulty concentrating, remembering, or making decisions?: No Does the patient have difficulty walking or climbing stairs?: No Does the patient have difficulty dressing or bathing?: No Does the patient have difficulty doing errands alone such as visiting a doctor's office or shopping?: No   Assessment & Plan  1. Uncontrolled type 2 diabetes with neuropathy ([place])  She already stopped Levemir, and is down on 0.6 of Victoza, she is worried donut hole and has changed her diet, she will try stopping medication, check glucose twice daily and bring a log in one month for review - Hemoglobin A1c  2. Essential hypertension  - EKG 12-Lead - COMPLETE METABOLIC PANEL WITH GFR  3. Dyslipidemia  - Lipid panel

## 2016-09-04 NOTE — Patient Instructions (Signed)
Controlled DM: fasting between 80-140 and 2 hours post -prandial ( after meals ) below 180

## 2016-09-12 ENCOUNTER — Other Ambulatory Visit: Payer: Self-pay | Admitting: Family Medicine

## 2016-09-12 DIAGNOSIS — G4700 Insomnia, unspecified: Secondary | ICD-10-CM

## 2016-09-12 NOTE — Telephone Encounter (Signed)
Patient requesting [facility] 

## 2016-09-16 ENCOUNTER — Other Ambulatory Visit: Payer: Self-pay | Admitting: Family Medicine

## 2016-09-16 NOTE — Telephone Encounter (Signed)
Patient requesting refill of Ambien to [facility]. 

## 2016-09-26 ENCOUNTER — Other Ambulatory Visit: Payer: Self-pay | Admitting: Family Medicine

## 2016-09-26 DIAGNOSIS — E1165 Type 2 diabetes mellitus with hyperglycemia: Secondary | ICD-10-CM | POA: Diagnosis not present

## 2016-09-26 DIAGNOSIS — I10 Essential (primary) hypertension: Secondary | ICD-10-CM | POA: Diagnosis not present

## 2016-09-26 DIAGNOSIS — E785 Hyperlipidemia, unspecified: Secondary | ICD-10-CM | POA: Diagnosis not present

## 2016-09-26 DIAGNOSIS — E1140 Type 2 diabetes mellitus with diabetic neuropathy, unspecified: Secondary | ICD-10-CM | POA: Diagnosis not present

## 2016-09-26 LAB — COMPLETE METABOLIC PANEL WITH GFR
ALT: 29 U/L (ref 6–29)
AST: 21 U/L (ref 10–35)
Albumin: 4.2 g/dL (ref 3.6–5.1)
Alkaline Phosphatase: 72 U/L (ref 33–130)
BUN: 10 mg/dL (ref 7–25)
CO2: 27 mmol/L (ref 20–31)
Calcium: 9.6 mg/dL (ref 8.6–10.4)
Chloride: 105 mmol/L (ref 98–110)
Creat: 0.68 mg/dL (ref 0.50–0.99)
GFR, Est African American: >89 mL/min (ref >=60)
GFR, Est Non African American: >89 mL/min (ref >=60)
Glucose, Bld: 129 mg/dL — ABNORMAL HIGH (ref 65–99)
Potassium: 4.5 mmol/L (ref 3.5–5.3)
Sodium: 142 mmol/L (ref 135–146)
Total Bilirubin: 0.5 mg/dL (ref 0.2–1.2)
Total Protein: 6.6 g/dL (ref 6.1–8.1)

## 2016-09-26 LAB — LIPID PANEL
Cholesterol: 179 mg/dL (ref <200)
HDL: 47 mg/dL — ABNORMAL LOW (ref >50)
LDL Cholesterol: 106 mg/dL — ABNORMAL HIGH (ref <100)
Total CHOL/HDL Ratio: 3.8 Ratio (ref <5.0)
Triglycerides: 130 mg/dL (ref <150)
VLDL: 26 mg/dL (ref <30)

## 2016-09-27 ENCOUNTER — Ambulatory Visit (INDEPENDENT_AMBULATORY_CARE_PROVIDER_SITE_OTHER): Payer: PPO | Admitting: Family Medicine

## 2016-09-27 ENCOUNTER — Encounter: Payer: Self-pay | Admitting: Family Medicine

## 2016-09-27 VITALS — BP 128/60 | HR 77 | Temp 98.1°F | Resp 16 | Ht 64.0 in | Wt 167.1 lb

## 2016-09-27 DIAGNOSIS — E785 Hyperlipidemia, unspecified: Secondary | ICD-10-CM

## 2016-09-27 DIAGNOSIS — G4700 Insomnia, unspecified: Secondary | ICD-10-CM

## 2016-09-27 DIAGNOSIS — E1165 Type 2 diabetes mellitus with hyperglycemia: Secondary | ICD-10-CM

## 2016-09-27 DIAGNOSIS — I2510 Atherosclerotic heart disease of native coronary artery without angina pectoris: Secondary | ICD-10-CM

## 2016-09-27 DIAGNOSIS — J4520 Mild intermittent asthma, uncomplicated: Secondary | ICD-10-CM | POA: Diagnosis not present

## 2016-09-27 LAB — POCT UA - MICROALBUMIN: Microalbumin Ur, POC: 20 mg/L

## 2016-09-27 LAB — POCT GLYCOSYLATED HEMOGLOBIN (HGB A1C): Hemoglobin A1C: 7.4

## 2016-09-27 LAB — HEMOGLOBIN A1C
Hgb A1c MFr Bld: 7.3 % — ABNORMAL HIGH (ref <5.7)
Mean Plasma Glucose: 163 mg/dL

## 2016-09-27 NOTE — Patient Instructions (Signed)
Fasting glucose needs to be between 80-140  2 hours after meals it needs to be below 180

## 2016-09-27 NOTE — Progress Notes (Signed)
Name: Dawn Werner   MRN: [id number]    DOB: August 22, 1950   Date:09/27/2016       Progress Note  Subjective  Chief Complaint  Chief Complaint  Patient presents with  . Diabetes    4 month follow up  . Insomnia  . Hyperlipidemia  . Hypertension  . Gastroesophageal Reflux    HPI  DMII: she has not been taking medication, metformin caused diarrhea, we gave her Victoza because of spiked glucose back in January  2018, she took for about one month, changed her diet and stopped medication because of cost. She has been exercising daily, and  hgbA1C went down from 7.8%  to 7.3%  She does not want to take any medications at this time, urine micro negative, no neuropathy, eye exam is up to date. FSBS at home has been in the 130's range  Hyperlipidemia: she is back on Crestor 70m and states no longer has muscle pain or cramps. No chest pain, LDL has improved HDL is above 40  Insomnia: she was trying to wean self off of Ambien, she has 10 mg tablets, and currently taking about half pill qhs .    HTN: No chest pain, palpitation or SOB,taking medication as prescribed. She states bp at home has been well controlled 120's-130's/70's  CAD: no episodes, s/p history of MI in 2009, taking aspirin daily, beta-blocker and statin therapy. No chest pain, no decrease in exercise tolerance, walking 30 daily without discomfort.   Asthma Mild Intermittent: she states that only occasionally has to use inhaler, less than once a week. She had a coughing spell last night and had to use it ( it happened after she worked outside in her yard). No decrease in exercise tolerance    Patient Active Problem List   Diagnosis Date Noted  . 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 Derangement of posterior horn of lateral meniscus of right knee 01/10/2016  . 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 Primary osteoarthritis of knee 01/24/2015  . Asthma, mild intermittent 11/18/2014  . Arteriosclerosis of coronary artery  11/18/2014  . Insomnia, persistent 11/18/2014  . Dyslipidemia 11/18/2014  . Acid reflux 11/18/2014  . Diabetes type 2, controlled ([place] 11/18/2014  . Genital herpes in women 11/18/2014  . Hypertension 11/18/2014  . Asymptomatic postmenopausal status 11/18/2014  . History of acute myocardial infarction 11/18/2014    Past Surgical History:  Procedure Laterality Date  . ABDOMINAL HYSTERECTOMY    . BREAST SURGERY     breast reduction  . REDUCTION MAMMAPLASTY Bilateral 1995  . TUBAL LIGATION      Family History  Problem Relation Age of Onset  . Hyperlipidemia Mother   . Hypertension Mother   . Stroke Mother   . Dementia Father   . Cancer Sister     breast  . Breast cancer Sister 472 . Asthma Brother   . Depression Brother     bipolar  . Heart disease Brother   . Heart disease Brother     Social History   Social History  . Marital status: Married    Spouse name: N/A  . Number of children: N/A  . Years of education: N/A   Occupational History  . Not on file.   Social History Main Topics  . Smoking status: Never Smoker  . Smokeless tobacco: Never Used  . Alcohol use No  . Drug use: No  . Sexual activity: Yes    Partners: Male   Other Topics Concern  . Not  on file   Social History Narrative  . No narrative on file     Current Outpatient Prescriptions:  .  acyclovir ointment (ZOVIRAX) 5 %, Apply 1 application topically 2 (two) times daily as needed. Reported on 06/13/2015, Disp: , Rfl:  .  albuterol (PROVENTIL HFA) 108 (90 BASE) MCG/ACT inhaler, Inhale 2 puffs into the lungs as needed., Disp: , Rfl:  .  aspirin EC 81 MG tablet, Take 1 tablet by mouth daily., Disp: , Rfl:  .  carvedilol (COREG) 6.25 MG tablet, Take 1 tablet (6.25 mg total) by mouth 2 (two) times daily., Disp: 180 tablet, Rfl: 1 .  colchicine 0.6 MG tablet, Take 1 tablet (0.6 mg total) by mouth daily. Take two at onset of gout and repeat in 1 hour, prn, Disp: 30 tablet, Rfl: 0 .  Elastic  Bandages & Supports (TENNIS ELBOW NEOPRENE BRACE) MISC, 1 Units by Does not apply route daily., Disp: 1 each, Rfl: 0 .  Insulin Pen Needle (NOVOFINE) 32G X 6 MM MISC, 1 each by Does not apply route 2 (two) times daily., Disp: 200 each, Rfl: 1 .  LORazepam (ATIVAN) 0.5 MG tablet, TAKE 1 TABLET BY MOUTH AT BEDTIME AS NEEDED SLEEP, Disp: 30 tablet, Rfl: 0 .  losartan (COZAAR) 50 MG tablet, Take 1 tablet (50 mg total) by mouth daily., Disp: 90 tablet, Rfl: 1 .  rosuvastatin (CRESTOR) 40 MG tablet, TAKE 1 TABLET (40 MG TOTAL) BY MOUTH DAILY., Disp: 90 tablet, Rfl: 1 .  valACYclovir (VALTREX) 500 MG tablet, TAKE 1 TABLET (500 MG TOTAL) BY MOUTH DAILY., Disp: 90 tablet, Rfl: 1 .  zolpidem (AMBIEN) 10 MG tablet, TAKE 1 TABLET BY MOUTH AT BEDTIME, Disp: 90 tablet, Rfl: 0  Allergies  Allergen Reactions  . Metformin And Related Diarrhea    And nausea     ROS  Constitutional: Negative for fever or weight change.  Respiratory: Negative for cough and shortness of breath.   Cardiovascular: Negative for chest pain or palpitations.  Gastrointestinal: Negative for abdominal pain, no bowel changes.  Musculoskeletal: Negative for gait problem or joint swelling.  Skin: Negative for rash.  Neurological: Negative for dizziness or headache.  No other specific complaints in a complete review of systems (except as listed in HPI above).  Objective  Vitals:   09/27/16 1013  BP: 128/60  Pulse: 77  Resp: 16  Temp: 98.1 F (36.7 C)  SpO2: 96%  Weight: 167 lb 1 oz (75.8 kg)  Height: 5' 4"  (1.626 m)    Body mass index is 28.68 kg/m.  Physical Exam  Constitutional: Patient appears well-developed and well-nourished. Obese  No distress.  HEENT: head atraumatic, normocephalic, pupils equal and reactive to light,neck supple, throat within normal limits Cardiovascular: Normal rate, regular rhythm and normal heart sounds.  No murmur heard. No BLE edema. Pulmonary/Chest: Effort normal and breath sounds  normal. No respiratory distress. Abdominal: Soft.  There is no tenderness. Psychiatric: Patient has a normal mood and affect. behavior is normal. Judgment and thought content normal.  Recent Results (from the past 2160 hour(s))  POCT Urinalysis Dipstick     Status: Abnormal   Collection Time: 07/09/16  2:37 PM  Result Value Ref Range   Color, UA yellow    Clarity, UA cloudy    Glucose, UA positive    Bilirubin, UA neg    Ketones, UA positive    Spec Grav, UA 1.020    Blood, UA neg    pH, UA 6.5  Protein, UA trace    Urobilinogen, UA negative    Nitrite, UA neg    Leukocytes, UA Negative Negative  CBC with Differential/Platelet     Status: Abnormal   Collection Time: 07/09/16  3:52 PM  Result Value Ref Range   WBC 9.6 3.8 - 10.8 K/uL   RBC 5.27 (H) 3.80 - 5.10 MIL/uL   Hemoglobin 14.9 11.7 - 15.5 g/dL   HCT 44.6 35.0 - 45.0 %   MCV 84.6 80.0 - 100.0 fL   MCH 28.3 27.0 - 33.0 pg   MCHC 33.4 32.0 - 36.0 g/dL   RDW 12.6 11.0 - 15.0 %   Platelets 305 140 - 400 K/uL   MPV 10.7 7.5 - 12.5 fL   Neutro Abs 6,144 1,500 - 7,800 cells/uL   Lymphs Abs 2,688 850 - 3,900 cells/uL   Monocytes Absolute 576 200 - 950 cells/uL   Eosinophils Absolute 96 15 - 500 cells/uL   Basophils Absolute 96 0 - 200 cells/uL   Neutrophils Relative % 64 %   Lymphocytes Relative 28 %   Monocytes Relative 6 %   Eosinophils Relative 1 %   Basophils Relative 1 %   Smear Review Criteria for review not met   Basic metabolic panel     Status: Abnormal   Collection Time: 07/09/16  3:54 PM  Result Value Ref Range   Sodium 135 135 - 146 mmol/L   Potassium 4.4 3.5 - 5.3 mmol/L   Chloride 94 (L) 98 - 110 mmol/L   CO2 27 20 - 31 mmol/L   Glucose, Bld 390 (H) 65 - 99 mg/dL   BUN 15 7 - 25 mg/dL   Creat 0.84 0.50 - 0.99 mg/dL    Comment:   For patients > or = 67 years of age: The upper reference limit for Creatinine is approximately 13% higher for people identified as African-American.      Calcium 10.6  (H) 8.6 - 10.4 mg/dL  Urine Culture     Status: None   Collection Time: 07/09/16  4:05 PM  Result Value Ref Range   Organism ID, Bacteria      Three or more organisms present,each greater than 10,000 CFU/mL.These organisms,commonly found on external and internal genitalia,are considered to be colonizers.No further testing performed.   POCT Wet Prep [facility] [place])     Status: Abnormal   Collection Time: 07/09/16  5:12 PM  Result Value Ref Range   Source Wet Prep POC vaginal    WBC, Wet Prep HPF POC few    Bacteria Wet Prep HPF POC None (A) Few   BACTERIA WET PREP MORPHOLOGY POC     Clue Cells Wet Prep HPF POC Few (A) None   Clue Cells Wet Prep Whiff POC     Yeast Wet Prep HPF POC Many    KOH Wet Prep POC     Trichomonas Wet Prep HPF POC Absent Absent  POCT Urinalysis Dipstick     Status: Abnormal   Collection Time: 07/10/16  7:58 AM  Result Value Ref Range   Color, UA YELLOW    Clarity, UA CLEAR    Glucose, UA 2+    Bilirubin, UA NEGATIVE    Ketones, UA LARGE    Spec Grav, UA 1.025    Blood, UA HEMOLYZED TRACE    pH, UA 6.5    Protein, UA TRACE    Urobilinogen, UA 0.2    Nitrite, UA NEGATIVE    Leukocytes, UA Negative Negative  POCT  Glucose (CBG)     Status: Abnormal   Collection Time: 07/10/16  7:59 AM  Result Value Ref Range   POC Glucose 330 (A) 70 - 99 mg/dl  POCT Glucose (CBG)     Status: Abnormal   Collection Time: 07/10/16  8:09 AM  Result Value Ref Range   POC Glucose 427 (A) 70 - 99 mg/dl  COMPLETE METABOLIC PANEL WITH GFR     Status: Abnormal   Collection Time: 09/26/16  8:33 AM  Result Value Ref Range   Sodium 142 135 - 146 mmol/L   Potassium 4.5 3.5 - 5.3 mmol/L   Chloride 105 98 - 110 mmol/L   CO2 27 20 - 31 mmol/L   Glucose, Bld 129 (H) 65 - 99 mg/dL   BUN 10 7 - 25 mg/dL   Creat 0.68 0.50 - 0.99 mg/dL    Comment:   For patients > or = 67 years of age: The upper reference limit for Creatinine is approximately 13% higher for people identified  as African-American.      Total Bilirubin 0.5 0.2 - 1.2 mg/dL   Alkaline Phosphatase 72 33 - 130 U/L   AST 21 10 - 35 U/L   ALT 29 6 - 29 U/L   Total Protein 6.6 6.1 - 8.1 g/dL   Albumin 4.2 3.6 - 5.1 g/dL   Calcium 9.6 8.6 - 10.4 mg/dL   GFR, Est African American >89 >=60 mL/min   GFR, Est Non African American >89 >=60 mL/min  Lipid panel     Status: Abnormal   Collection Time: 09/26/16  8:33 AM  Result Value Ref Range   Cholesterol 179 <200 mg/dL   Triglycerides 130 <150 mg/dL   HDL 47 (L) >50 mg/dL   Total CHOL/HDL Ratio 3.8 <5.0 Ratio   VLDL 26 <30 mg/dL   LDL Cholesterol 106 (H) <100 mg/dL  Hemoglobin A1c     Status: Abnormal   Collection Time: 09/26/16  8:33 AM  Result Value Ref Range   Hgb A1c MFr Bld 7.3 (H) <5.7 %    Comment:   For someone without known diabetes, a hemoglobin A1c value of 6.5% or greater indicates that they may have diabetes and this should be confirmed with a follow-up test.   For someone with known diabetes, a value <7% indicates that their diabetes is well controlled and a value greater than or equal to 7% indicates suboptimal control. A1c targets should be individualized based on duration of diabetes, age, comorbid conditions, and other considerations.   Currently, no consensus exists for use of hemoglobin A1c for diagnosis of diabetes for children.      Mean Plasma Glucose 163 mg/dL  POCT HgB A1C     Status: Abnormal   Collection Time: 09/27/16 10:22 AM  Result Value Ref Range   Hemoglobin A1C 7.4   POCT UA - Microalbumin     Status: Normal   Collection Time: 09/27/16 10:23 AM  Result Value Ref Range   Microalbumin Ur, POC 20 mg/L   Creatinine, POC  mg/dL   Albumin/Creatinine Ratio, Urine, POC       PHQ2/9: Depression screen [facility] 2/9 09/27/2016 09/04/2016 06/24/2016 01/10/2016 09/11/2015  Decreased Interest 0 0 0 0 0  Down, Depressed, Hopeless 0 0 0 0 0  PHQ - 2 Score 0 0 0 0 0     Fall Risk: Fall Risk  09/27/2016 09/04/2016  06/24/2016 01/10/2016 09/11/2015  Falls in the past year? No No No No No  Assessment & Plan  1. Uncontrolled type 2 diabetes mellitus with hyperglycemia, without long-term current use of insulin ([place])  She does not want to take medications, she will continue daily physical activity and life style modification, hgbA1C only slightly above goal and it has improved since last visit.               - POCT HgB A1C - POCT UA - Microalbumin  2. Dyslipidemia  She is taking statin therapy, LDL slightly above 100, but greatly improved since last year  3. Arteriosclerosis of coronary artery  With history of MI, doing well, walking daily, no chest pain  4. Insomnia, persistent  She is sleeping on Ambien   5. Mild intermittent asthma without complication  Doing well at this time

## 2016-10-09 ENCOUNTER — Telehealth: Payer: Self-pay

## 2016-10-09 NOTE — Telephone Encounter (Signed)
Patient states she was unable to reach a person on the phone at her dentist. Can you please see if she would like to come in to see Irving Burton?

## 2016-10-09 NOTE — Telephone Encounter (Signed)
Going to the Dentist next Thursday and has a abscess on both tooth that attached to her partial. It is really inflamed and bothering her. She wanted to see if Dr. Carlynn Purl could send her in an antibiotic.

## 2016-10-09 NOTE — Telephone Encounter (Signed)
Did she ask dentist to fill it? I will need to see her, or she can see Dawn Werner before we give her antibiotics

## 2016-10-10 ENCOUNTER — Encounter: Payer: Self-pay | Admitting: Family Medicine

## 2016-10-10 ENCOUNTER — Ambulatory Visit (INDEPENDENT_AMBULATORY_CARE_PROVIDER_SITE_OTHER): Payer: PPO | Admitting: Family Medicine

## 2016-10-10 VITALS — BP 120/72 | HR 78 | Temp 98.1°F | Resp 16 | Ht 64.0 in | Wt 168.3 lb

## 2016-10-10 DIAGNOSIS — K047 Periapical abscess without sinus: Secondary | ICD-10-CM

## 2016-10-10 MED ORDER — PENICILLIN V POTASSIUM 500 MG PO TABS: 500.0000 mg | ORAL_TABLET | Freq: Four times a day (QID) | ORAL | 0 refills | Status: AC

## 2016-10-10 NOTE — Progress Notes (Addendum)
Name: Dawn Werner   MRN: [id number]    DOB: 1951-05-19   Date:10/10/2016       Progress Note  Subjective  Chief Complaint  Chief Complaint  Patient presents with  . Dental Pain    HPI  Pt presents with 6 day history of left lower gum pain and swelling. She wears a partial lower denture, but has been avoiding wearing it because it irritates the area. She has an appointment with dentist in 5 days, but states she does not want to wait for antibiotic treatment until then.  She rates pain as 5/10, has been taking Aleve with moderate pain control. No drainage or bleeding, fevers, chills, NVD, body aches.  Patient Active Problem List   Diagnosis Date Noted  . 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 Derangement of posterior horn of lateral meniscus of right knee 01/10/2016  . 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 Primary osteoarthritis of knee 01/24/2015  . Asthma, mild intermittent 11/18/2014  . Arteriosclerosis of coronary artery 11/18/2014  . Insomnia, persistent 11/18/2014  . Dyslipidemia 11/18/2014  . Acid reflux 11/18/2014  . Diabetes type 2, controlled ([facility]) 11/18/2014  . Genital herpes in women 11/18/2014  . Hypertension 11/18/2014  . Asymptomatic postmenopausal status 11/18/2014  . History of acute myocardial infarction 11/18/2014    Social History  Substance Use Topics  . Smoking status: Never Smoker  . Smokeless tobacco: Never Used  . Alcohol use No     Current Outpatient Prescriptions:  .  acyclovir ointment (ZOVIRAX) 5 %, Apply 1 application topically 2 (two) times daily as needed. Reported on 06/13/2015, Disp: , Rfl:  .  albuterol (PROVENTIL HFA) 108 (90 BASE) MCG/ACT inhaler, Inhale 2 puffs into the lungs as needed., Disp: , Rfl:  .  aspirin EC 81 MG tablet, Take 1 tablet by mouth daily., Disp: , Rfl:  .  carvedilol (COREG) 6.25 MG tablet, Take 1 tablet (6.25 mg total) by mouth 2 (two) times daily., Disp: 180 tablet, Rfl: 1 .  colchicine 0.6 MG tablet, Take 1 tablet (0.6 mg  total) by mouth daily. Take two at onset of gout and repeat in 1 hour, prn, Disp: 30 tablet, Rfl: 0 .  Elastic Bandages & Supports (TENNIS ELBOW NEOPRENE BRACE) MISC, 1 Units by Does not apply route daily., Disp: 1 each, Rfl: 0 .  Insulin Pen Needle (NOVOFINE) 32G X 6 MM MISC, 1 each by Does not apply route 2 (two) times daily., Disp: 200 each, Rfl: 1 .  LORazepam (ATIVAN) 0.5 MG tablet, TAKE 1 TABLET BY MOUTH AT BEDTIME AS NEEDED SLEEP, Disp: 30 tablet, Rfl: 0 .  losartan (COZAAR) 50 MG tablet, Take 1 tablet (50 mg total) by mouth daily., Disp: 90 tablet, Rfl: 1 .  rosuvastatin (CRESTOR) 40 MG tablet, TAKE 1 TABLET (40 MG TOTAL) BY MOUTH DAILY., Disp: 90 tablet, Rfl: 1 .  valACYclovir (VALTREX) 500 MG tablet, TAKE 1 TABLET (500 MG TOTAL) BY MOUTH DAILY., Disp: 90 tablet, Rfl: 1 .  zolpidem (AMBIEN) 10 MG tablet, TAKE 1 TABLET BY MOUTH AT BEDTIME, Disp: 90 tablet, Rfl: 0  Allergies  Allergen Reactions  . Metformin And Related Diarrhea    And nausea    ROS  Constitutional: Negative for fever or weight change.  HEENT: See HPI.  Respiratory: Negative for cough and shortness of breath.   Cardiovascular: Negative for chest pain or palpitations.  Gastrointestinal: Negative for abdominal pain, no bowel changes.  Musculoskeletal: Negative for gait problem or joint swelling.  Skin:  Negative for rash.  Neurological: Negative for dizziness or headache.  No other specific complaints in a complete review of systems (except as listed in HPI above).  Objective  Vitals:   10/10/16 0906  BP: 120/72  Pulse: 78  Resp: 16  Temp: 98.1 F (36.7 C)  TempSrc: Oral  SpO2: 95%  Weight: 168 lb 4.8 oz (76.3 kg)  Height: 5\' 4"  (1.626 m)    Body mass index is 28.89 kg/m.  Nursing Note and Vital Signs reviewed.  Physical Exam  HENT:  Head: Normocephalic and atraumatic.  Mouth/Throat: Oropharynx is clear and moist.     Constitutional: Patient appears well-developed and well-nourished.  Overweight.  No distress.  Cardiovascular: Normal rate, regular rhythm, S1/S2 present.  No murmur or rub heard. No BLE edema. Pulmonary/Chest: Effort normal and breath sounds clear. No respiratory distress or retractions. Psychiatric: Patient has a normal mood and affect. behavior is normal. Judgment and thought content normal.  Recent Results (from the past 2160 hour(s))  COMPLETE METABOLIC PANEL WITH GFR     Status: Abnormal   Collection Time: 09/26/16  8:33 AM  Result Value Ref Range   Sodium 142 135 - 146 mmol/L   Potassium 4.5 3.5 - 5.3 mmol/L   Chloride 105 98 - 110 mmol/L   CO2 27 20 - 31 mmol/L   Glucose, Bld 129 (H) 65 - 99 mg/dL   BUN 10 7 - 25 mg/dL   Creat 1.61 0.96 - 0.45 mg/dL    Comment:   For patients > or = 67 years of age: The upper reference limit for Creatinine is approximately 13% higher for people identified as African-American.      Total Bilirubin 0.5 0.2 - 1.2 mg/dL   Alkaline Phosphatase 72 33 - 130 U/L   AST 21 10 - 35 U/L   ALT 29 6 - 29 U/L   Total Protein 6.6 6.1 - 8.1 g/dL   Albumin 4.2 3.6 - 5.1 g/dL   Calcium 9.6 8.6 - 40.9 mg/dL   GFR, Est African American >89 >=60 mL/min   GFR, Est Non African American >89 >=60 mL/min  Lipid panel     Status: Abnormal   Collection Time: 09/26/16  8:33 AM  Result Value Ref Range   Cholesterol 179 <200 mg/dL   Triglycerides 811 <914 mg/dL   HDL 47 (L) >78 mg/dL   Total CHOL/HDL Ratio 3.8 <5.0 Ratio   VLDL 26 <30 mg/dL   LDL Cholesterol 295 (H) <100 mg/dL  Hemoglobin A2Z     Status: Abnormal   Collection Time: 09/26/16  8:33 AM  Result Value Ref Range   Hgb A1c MFr Bld 7.3 (H) <5.7 %    Comment:   For someone without known diabetes, a hemoglobin A1c value of 6.5% or greater indicates that they may have diabetes and this should be confirmed with a follow-up test.   For someone with known diabetes, a value <7% indicates that their diabetes is well controlled and a value greater than or equal to  7% indicates suboptimal control. A1c targets should be individualized based on duration of diabetes, age, comorbid conditions, and other considerations.   Currently, no consensus exists for use of hemoglobin A1c for diagnosis of diabetes for children.      Mean Plasma Glucose 163 mg/dL  POCT HgB H0Q     Status: Abnormal   Collection Time: 09/27/16 10:22 AM  Result Value Ref Range   Hemoglobin A1C 7.4   POCT UA - Microalbumin  Status: Normal   Collection Time: 09/27/16 10:23 AM  Result Value Ref Range   Microalbumin Ur, POC 20 mg/L   Creatinine, POC  mg/dL   Albumin/Creatinine Ratio, Urine, POC       Assessment & Plan  1. Dental infection - penicillin v potassium (VEETID) 500 MG tablet; Take 1 tablet (500 mg total) by mouth 4 (four) times daily.  Dispense: 28 tablet; Refill: 0 -Avoid irritants, including denture when possible.  -Red flags and when to present for emergency care or RTC including fever >101.79F, chest pain, shortness of breath, new/worsening/un-resolving symptoms, increased swelling, drainage or bleeding from gumline reviewed with patient at time of visit. Follow up and care instructions discussed and provided in AVS. -Health Maintenance UTD  I have reviewed this encounter including the documentation in this note and/or discussed this patient with the Deboraha Sprang, FNP, NP-C. I am certifying that I agree with the content of this note as supervising physician.  Alba Cory, MD [facility] 10/10/2016, 2:33 PM

## 2016-10-10 NOTE — Telephone Encounter (Signed)
Pt is coming in today to see Dawn Werner °

## 2016-12-19 ENCOUNTER — Other Ambulatory Visit: Payer: Self-pay | Admitting: Family Medicine

## 2016-12-19 NOTE — Telephone Encounter (Signed)
Patient requesting refill of Ambien to [facility]. 

## 2016-12-21 ENCOUNTER — Other Ambulatory Visit: Payer: Self-pay | Admitting: Family Medicine

## 2017-01-05 ENCOUNTER — Other Ambulatory Visit: Payer: Self-pay | Admitting: Family Medicine

## 2017-01-05 DIAGNOSIS — E785 Hyperlipidemia, unspecified: Secondary | ICD-10-CM

## 2017-01-05 DIAGNOSIS — I252 Old myocardial infarction: Secondary | ICD-10-CM

## 2017-01-27 ENCOUNTER — Ambulatory Visit: Payer: PPO | Admitting: Family Medicine

## 2017-01-29 ENCOUNTER — Encounter: Payer: Self-pay | Admitting: Family Medicine

## 2017-01-29 ENCOUNTER — Ambulatory Visit (INDEPENDENT_AMBULATORY_CARE_PROVIDER_SITE_OTHER): Payer: PPO | Admitting: Family Medicine

## 2017-01-29 VITALS — BP 128/68 | HR 77 | Temp 98.1°F | Resp 16 | Ht 64.0 in | Wt 167.2 lb

## 2017-01-29 DIAGNOSIS — E785 Hyperlipidemia, unspecified: Secondary | ICD-10-CM

## 2017-01-29 DIAGNOSIS — J4520 Mild intermittent asthma, uncomplicated: Secondary | ICD-10-CM | POA: Diagnosis not present

## 2017-01-29 DIAGNOSIS — Z23 Encounter for immunization: Secondary | ICD-10-CM | POA: Diagnosis not present

## 2017-01-29 DIAGNOSIS — E1129 Type 2 diabetes mellitus with other diabetic kidney complication: Secondary | ICD-10-CM | POA: Diagnosis not present

## 2017-01-29 DIAGNOSIS — I2510 Atherosclerotic heart disease of native coronary artery without angina pectoris: Secondary | ICD-10-CM

## 2017-01-29 DIAGNOSIS — E1165 Type 2 diabetes mellitus with hyperglycemia: Secondary | ICD-10-CM | POA: Diagnosis not present

## 2017-01-29 DIAGNOSIS — G4700 Insomnia, unspecified: Secondary | ICD-10-CM | POA: Diagnosis not present

## 2017-01-29 DIAGNOSIS — R809 Proteinuria, unspecified: Secondary | ICD-10-CM | POA: Diagnosis not present

## 2017-01-29 DIAGNOSIS — S60459A Superficial foreign body of unspecified finger, initial encounter: Secondary | ICD-10-CM

## 2017-01-29 DIAGNOSIS — IMO0002 Reserved for concepts with insufficient information to code with codable children: Secondary | ICD-10-CM

## 2017-01-29 DIAGNOSIS — I10 Essential (primary) hypertension: Secondary | ICD-10-CM

## 2017-01-29 LAB — POCT GLYCOSYLATED HEMOGLOBIN (HGB A1C): Hemoglobin A1C: 7.3

## 2017-01-29 MED ORDER — CARVEDILOL 6.25 MG PO TABS: 6.2500 mg | ORAL_TABLET | Freq: Two times a day (BID) | ORAL | 1 refills | Status: DC

## 2017-01-29 MED ORDER — ZOLPIDEM TARTRATE 10 MG PO TABS: 10.0000 mg | ORAL_TABLET | Freq: Every day | ORAL | 3 refills | Status: DC

## 2017-01-29 MED ORDER — LOSARTAN POTASSIUM 50 MG PO TABS: 50.0000 mg | ORAL_TABLET | Freq: Every day | ORAL | 1 refills | Status: DC

## 2017-01-29 NOTE — Progress Notes (Signed)
Name: Dawn Werner   MRN: [id number]    DOB: 13-Oct-1950   Date:01/29/2017       Progress Note  Subjective  Chief Complaint  Chief Complaint  Patient presents with  . Diabetes  . Hyperlipidemia  . Insomnia  . Hypertension  . Gastroesophageal Reflux    HPI  DMII: she has not been taking medication, metformin caused diarrhea, we gave her Victoza because of spiked glucose back in January  2018, she took for about one month, changed her diet and stopped medication because of cost. She has been exercising daily, works in her yard,  and  hgbA1C went down from 7.8%  to 7.3%, up to 7.4%, down to 7.3%.   She does not want to take any medications at this time, urine micro negative last time, but has history of nephropathy and is on ARB, no neuropathy, eye exam is up to date. FSBS at home has been in the 140's  Range, post-prandial is at goal. She will decrease snacks at night  Hyperlipidemia: she is back on Crestor 40mg  and states no longer has muscle pain or cramps. No chest pain, LDL has improved HDL is above 40  Insomnia: she was trying to wean self off of Ambien, she has 10 mg tablets, and currently taking a bite off her pills, but states Ambien 5 mg is more expensive  HTN: No chest pain, palpitation or SOB,taking medication as prescribed. She states bp at home has been well controlled 120's-130's/70's  CAD: no episodes, s/p history of MI in 2009, taking aspirin daily, beta-blocker and statin therapy. No chest pain, no decrease in exercise tolerance, walking one hour daily  without discomfort.   Asthma Mild Intermittent: she states that only occasionally has to use inhaler, less than once a week. She has occasional cough, no wheezing or SOB at this time. No decrease in exercise tolerance, she does not want to have spirometry at this time  OA right knee: no longer has pain, avoiding high hills and is active  Splinter: works in her yard, and has a splinter under her right middle  finger, it is sore, and is due for tetanus shot  Patient Active Problem List   Diagnosis Date Noted  . 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 Derangement of posterior horn of lateral meniscus of right knee 01/10/2016  . 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 Primary osteoarthritis of knee 01/24/2015  . Asthma, mild intermittent 11/18/2014  . Arteriosclerosis of coronary artery 11/18/2014  . Insomnia, persistent 11/18/2014  . Dyslipidemia 11/18/2014  . Acid reflux 11/18/2014  . Diabetes type 2, controlled (HCC) 11/18/2014  . Genital herpes in women 11/18/2014  . Hypertension 11/18/2014  . Asymptomatic postmenopausal status 11/18/2014  . History of acute myocardial infarction 11/18/2014    Past Surgical History:  Procedure Laterality Date  . ABDOMINAL HYSTERECTOMY    . BREAST SURGERY     breast reduction  . REDUCTION MAMMAPLASTY Bilateral 1995  . TUBAL LIGATION      Family History  Problem Relation Age of Onset  . Hyperlipidemia Mother   . Hypertension Mother   . Stroke Mother   . Dementia Father   . Cancer Sister        breast  . Breast cancer Sister 8  . Asthma Brother   . Depression Brother        bipolar  . Heart disease Brother   . Heart disease Brother     Social History   Social History  .  Marital status: Married    Spouse name: N/A  . Number of children: N/A  . Years of education: N/A   Occupational History  . Not on file.   Social History Main Topics  . Smoking status: Never Smoker  . Smokeless tobacco: Never Used  . Alcohol use No  . Drug use: No  . Sexual activity: Yes    Partners: Male   Other Topics Concern  . Not on file   Social History Narrative  . No narrative on file     Current Outpatient Prescriptions:  .  acyclovir ointment (ZOVIRAX) 5 %, Apply 1 application topically 2 (two) times daily as needed. Reported on 06/13/2015, Disp: , Rfl:  .  albuterol (PROVENTIL HFA) 108 (90 BASE) MCG/ACT inhaler, Inhale 2 puffs into the lungs as needed.,  Disp: , Rfl:  .  aspirin EC 81 MG tablet, Take 1 tablet by mouth daily., Disp: , Rfl:  .  carvedilol (COREG) 6.25 MG tablet, Take 1 tablet (6.25 mg total) by mouth 2 (two) times daily., Disp: 180 tablet, Rfl: 1 .  colchicine 0.6 MG tablet, Take 1 tablet (0.6 mg total) by mouth daily. Take two at onset of gout and repeat in 1 hour, prn, Disp: 30 tablet, Rfl: 0 .  Elastic Bandages & Supports (TENNIS ELBOW NEOPRENE BRACE) MISC, 1 Units by Does not apply route daily., Disp: 1 each, Rfl: 0 .  Insulin Pen Needle (NOVOFINE) 32G X 6 MM MISC, 1 each by Does not apply route 2 (two) times daily., Disp: 200 each, Rfl: 1 .  losartan (COZAAR) 50 MG tablet, Take 1 tablet (50 mg total) by mouth daily., Disp: 90 tablet, Rfl: 1 .  rosuvastatin (CRESTOR) 40 MG tablet, TAKE 1 TABLET (40 MG TOTAL) BY MOUTH DAILY., Disp: 90 tablet, Rfl: 1 .  valACYclovir (VALTREX) 500 MG tablet, TAKE 1 TABLET (500 MG TOTAL) BY MOUTH DAILY., Disp: 90 tablet, Rfl: 1 .  zolpidem (AMBIEN) 10 MG tablet, Take 1 tablet (10 mg total) by mouth at bedtime., Disp: 30 tablet, Rfl: 3  Allergies  Allergen Reactions  . Metformin And Related Diarrhea    And nausea     ROS  Constitutional: Negative for fever or weight change.  Respiratory: Occasional for cough but no  shortness of breath.   Cardiovascular: Negative for chest pain or palpitations.  Gastrointestinal: Negative for abdominal pain, no bowel changes.  Musculoskeletal: Negative for gait problem , mild right knee  joint swelling.  Skin: Negative for rash.  Neurological: Negative for dizziness or headache.  No other specific complaints in a complete review of systems (except as listed in HPI above).  Objective  Vitals:   01/29/17 1011  BP: 128/68  Pulse: 77  Resp: 16  Temp: 98.1 F (36.7 C)  SpO2: 95%  Weight: 167 lb 4 oz (75.9 kg)  Height: 5\' 4"  (1.626 m)    Body mass index is 28.71 kg/m.  Physical Exam  Constitutional: Patient appears well-developed and  well-nourished. Overweight.  No distress.  HEENT: head atraumatic, normocephalic, pupils equal and reactive to light,  neck supple, throat within normal limits Cardiovascular: Normal rate, regular rhythm and normal heart sounds.  No murmur heard. No BLE edema. Pulmonary/Chest: Effort normal and breath sounds normal. No respiratory distress. Abdominal: Soft.  There is no tenderness. Psychiatric: Patient has a normal mood and affect. behavior is normal. Judgment and thought content normal. Skin: small splinter under right middle finger but she wants to soak at home  Diabetic  Foot Exam: Diabetic Foot Exam - Simple   Simple Foot Form Diabetic Foot exam was performed with the following findings:  Yes 01/29/2017 10:43 AM  Visual Inspection No deformities, no ulcerations, no other skin breakdown bilaterally:  Yes Sensation Testing Intact to touch and monofilament testing bilaterally:  Yes Pulse Check Posterior Tibialis and Dorsalis pulse intact bilaterally:  Yes Comments     PHQ2/9: Depression screen [facility] 2/9 09/27/2016 09/04/2016 06/24/2016 01/10/2016 09/11/2015  Decreased Interest 0 0 0 0 0  Down, Depressed, Hopeless 0 0 0 0 0  PHQ - 2 Score 0 0 0 0 0     Fall Risk: Fall Risk  09/27/2016 09/04/2016 06/24/2016 01/10/2016 09/11/2015  Falls in the past year? No No No No No     Assessment & Plan  1. Uncontrolled type 2 diabetes mellitus without complication, without long-term current use of insulin (HCC)  She is on diet only, hgbA1C is above 7, but she does not want to take medications at this time - POCT HgB A1C - losartan (COZAAR) 50 MG tablet; Take 1 tablet (50 mg total) by mouth daily.  Dispense: 90 tablet; Refill: 1  2. Superficial foreign body of finger, initial encounter  On right middle finger  3. Need for Td vaccine  - Td : Tetanus/diphtheria >7yo Preservative  free  4. Dyslipidemia  Continue crestor  5. Arteriosclerosis of coronary artery  Continue statin and aspirin daily    6. Insomnia, persistent  She takes a little less than 10 mg but 5 mg does not work, she is aware of long term use of Ambien  - zolpidem (AMBIEN) 10 MG tablet; Take 1 tablet (10 mg total) by mouth at bedtime.  Dispense: 30 tablet; Refill: 3  7. Essential hypertension  - losartan (COZAAR) 50 MG tablet; Take 1 tablet (50 mg total) by mouth daily.  Dispense: 90 tablet; Refill: 1 - carvedilol (COREG) 6.25 MG tablet; Take 1 tablet (6.25 mg total) by mouth 2 (two) times daily.  Dispense: 180 tablet; Refill: 1  8. Mild intermittent asthma without complication  Doing well at this time  9. Needs flu shot  - Flu vaccine HIGH DOSE PF

## 2017-03-26 ENCOUNTER — Ambulatory Visit
Admission: EM | Admit: 2017-03-26 | Discharge: 2017-03-26 | Disposition: A | Discharge status: Home / Self Care | Payer: PPO | Attending: Family Medicine | Admitting: Family Medicine

## 2017-03-26 ENCOUNTER — Encounter: Payer: Self-pay | Admitting: *Deleted

## 2017-03-26 DIAGNOSIS — R300 Dysuria: Secondary | ICD-10-CM

## 2017-03-26 DIAGNOSIS — N390 Urinary tract infection, site not specified: Secondary | ICD-10-CM

## 2017-03-26 DIAGNOSIS — R319 Hematuria, unspecified: Secondary | ICD-10-CM

## 2017-03-26 LAB — URINALYSIS, COMPLETE (UACMP) WITH MICROSCOPIC
Bilirubin Urine: NEGATIVE
Glucose, UA: NEGATIVE mg/dL
Leukocytes, UA: NEGATIVE
Nitrite: NEGATIVE
Protein, ur: NEGATIVE mg/dL
Specific Gravity, Urine: 1.02 (ref 1.005–1.030)
pH: 5.5 (ref 5.0–8.0)

## 2017-03-26 MED ORDER — SULFAMETHOXAZOLE-TRIMETHOPRIM 800-160 MG PO TABS: 1.0000 | ORAL_TABLET | Freq: Two times a day (BID) | ORAL | 0 refills | Status: DC

## 2017-03-26 NOTE — ED Provider Notes (Signed)
[facility]    CSN: [id number] Arrival date & time: 03/26/17  1818     History   Chief Complaint Chief Complaint  Patient presents with  . Dysuria  . Urinary Frequency  . Back Pain    HPI [name] is a 66 y.o. female.   The history is provided by the patient.  Dysuria  Pain quality:  Burning Pain severity:  Mild Onset quality:  Sudden Timing:  Constant Progression:  Worsening Chronicity:  Recurrent Recent urinary tract infections: no   Relieved by:  None tried Worsened by:  Nothing Urinary symptoms: frequent urination   Urinary symptoms: no discolored urine, no foul-smelling urine, no hematuria, no hesitancy and no bladder incontinence   Associated symptoms: no abdominal pain, no fever, no flank pain, no genital lesions, no nausea, no vaginal discharge and no vomiting   Risk factors: no hx of pyelonephritis, no hx of urolithiasis, no kidney transplant, not pregnant, no recurrent urinary tract infections, no renal cysts, no renal disease, not sexually active, no sexually transmitted infections, no single kidney and no urinary catheter   Urinary Frequency  Pertinent negatives include no abdominal pain.  Back Pain  Associated symptoms: dysuria   Associated symptoms: no abdominal pain and no fever     Past Medical History:  Diagnosis Date  . Anxiety   . Asthma   . Diabetes mellitus without complication (HCC)   . GERD (gastroesophageal reflux disease)   . Hyperlipidemia   . Insomnia     Patient Active Problem List   Diagnosis Date Noted  . 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 Derangement of posterior horn of lateral meniscus of right knee 01/10/2016  . 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 Primary osteoarthritis of knee 01/24/2015  . Asthma, mild intermittent 11/18/2014  . Arteriosclerosis of coronary artery 11/18/2014  . Insomnia, persistent 11/18/2014  . Dyslipidemia 11/18/2014  . Acid reflux 11/18/2014  . Diabetes type 2, controlled (HCC)  11/18/2014  . Genital herpes in women 11/18/2014  . Hypertension 11/18/2014  . Asymptomatic postmenopausal status 11/18/2014  . History of acute myocardial infarction 11/18/2014    Past Surgical History:  Procedure Laterality Date  . ABDOMINAL HYSTERECTOMY    . BREAST SURGERY     breast reduction  . REDUCTION MAMMAPLASTY Bilateral 1995  . TUBAL LIGATION      OB History    No data available       Home Medications    Prior to Admission medications   Medication Sig Start Date End Date Taking? Authorizing Provider  albuterol (PROVENTIL HFA) 108 (90 BASE) MCG/ACT inhaler Inhale 2 puffs into the lungs as needed.   Yes [provider]  aspirin EC 81 MG tablet Take 1 tablet by mouth daily.   Yes [provider]  carvedilol (COREG) 6.25 MG tablet Take 1 tablet (6.25 mg total) by mouth 2 (two) times daily. 01/29/17  Yes Sowles, Danna HeftyKrichna, MD  losartan (COZAAR) 50 MG tablet Take 1 tablet (50 mg total) by mouth daily. 01/29/17  Yes Sowles, Danna HeftyKrichna, MD  rosuvastatin (CRESTOR) 40 MG tablet TAKE 1 TABLET (40 MG TOTAL) BY MOUTH DAILY. 01/05/17  Yes Sowles, Danna HeftyKrichna, MD  zolpidem (AMBIEN) 10 MG tablet Take 1 tablet (10 mg total) by mouth at bedtime. 01/29/17  Yes Sowles, Danna HeftyKrichna, MD  acyclovir ointment (ZOVIRAX) 5 % Apply 1 application topically 2 (two) times daily as needed. Reported on 06/13/2015 04/28/14   [provider]  colchicine 0.6 MG tablet Take 1 tablet (0.6  mg total) by mouth daily. Take two at onset of gout and repeat in 1 hour, prn 01/24/15   Alba Cory, MD  Elastic Bandages & Supports (TENNIS ELBOW NEOPRENE BRACE) MISC 1 Units by Does not apply route daily. 06/13/15   Alba Cory, MD  Insulin Pen Needle (NOVOFINE) 32G X 6 MM MISC 1 each by Does not apply route 2 (two) times daily. 07/10/16   Alba Cory, MD  sulfamethoxazole-trimethoprim (BACTRIM DS,SEPTRA DS) 800-160 MG tablet Take 1 tablet by mouth 2 (two) times daily. 03/26/17   Payton Mccallum, MD    valACYclovir (VALTREX) 500 MG tablet TAKE 1 TABLET (500 MG TOTAL) BY MOUTH DAILY. 08/04/16   Alba Cory, MD    Family History Family History  Problem Relation Age of Onset  . Hyperlipidemia Mother   . Hypertension Mother   . Stroke Mother   . Dementia Father   . Cancer Sister        breast  . Breast cancer Sister 84  . Asthma Brother   . Depression Brother        bipolar  . Heart disease Brother   . Heart disease Brother     Social History Social History  Substance Use Topics  . Smoking status: Never Smoker  . Smokeless tobacco: Never Used  . Alcohol use No     Allergies   Metformin and related   Review of Systems Review of Systems  Constitutional: Negative for fever.  Gastrointestinal: Negative for abdominal pain, nausea and vomiting.  Genitourinary: Positive for dysuria and frequency. Negative for flank pain and vaginal discharge.  Musculoskeletal: Positive for back pain.     Physical Exam Triage Vital Signs ED Triage Vitals  Enc Vitals Group     BP 03/26/17 1847 (!) 155/67     Pulse Rate 03/26/17 1847 72     Resp 03/26/17 1847 16     Temp 03/26/17 1847 97.8 F (36.6 C)     Temp Source 03/26/17 1847 Oral     SpO2 03/26/17 1847 100 %     Weight 03/26/17 1850 160 lb (72.6 kg)     Height 03/26/17 1850 5\' 4"  (1.626 m)     Head Circumference --      Peak Flow --      Pain Score --      Pain Loc --      Pain Edu? --      Excl. in GC? --    No data found.   Updated Vital Signs BP (!) 155/67 (BP Location: Left Arm)   Pulse 72   Temp 97.8 F (36.6 C) (Oral)   Resp 16   Ht 5\' 4"  (1.626 m)   Wt 160 lb (72.6 kg)   SpO2 100%   BMI 27.46 kg/m   Visual Acuity Right Eye Distance:   Left Eye Distance:   Bilateral Distance:    Right Eye Near:   Left Eye Near:    Bilateral Near:     Physical Exam  Constitutional: She appears well-developed and well-nourished. No distress.  Abdominal: Soft. Bowel sounds are normal. She exhibits no distension  and no mass. There is tenderness (mild suprapubic). There is no rebound and no guarding.  Skin: She is not diaphoretic.  Nursing note and vitals reviewed.    UC Treatments / Results  Labs (all labs ordered are listed, but only abnormal results are displayed) Labs Reviewed  URINALYSIS, COMPLETE (UACMP) WITH MICROSCOPIC - Abnormal; Notable for the following:  Result Value   Hgb urine dipstick TRACE (*)    Ketones, ur TRACE (*)    Squamous Epithelial / LPF 6-30 (*)    Bacteria, UA RARE (*)    All other components within normal limits    EKG  EKG Interpretation None       Radiology No results found.  Procedures Procedures (including critical care time)  Medications Ordered in UC Medications - No data to display   Initial Impression / Assessment and Plan / UC Course  I have reviewed the triage vital signs and the nursing notes.  Pertinent labs & imaging results that were available during my care of the patient were reviewed by me and considered in my medical decision making (see chart for details).       Final Clinical Impressions(s) / UC Diagnoses   Final diagnoses:  Urinary tract infection with hematuria, site unspecified    New Prescriptions Discharge Medication List as of 03/26/2017  7:16 PM    START taking these medications   Details  sulfamethoxazole-trimethoprim (BACTRIM DS,SEPTRA DS) 800-160 MG tablet Take 1 tablet by mouth 2 (two) times daily., Starting Wed 03/26/2017, Normal       1. Lab results and diagnosis reviewed with patient 2. rx as per orders above; reviewed possible side effects, interactions, risks and benefits  3. Recommend supportive treatment with increased water intake, otc analgesics prn  4. Follow-up prn if symptoms worsen or don't improve   Controlled Substance Prescriptions [place] Controlled Substance Registry consulted? Not Applicable   Payton Mccallum, MD 03/26/17 [phone]

## 2017-03-26 NOTE — ED Triage Notes (Signed)
Dysuria, urinary freq/urg, low back pain, x2 days. Hx of recurrent UTIs.

## 2017-04-04 ENCOUNTER — Ambulatory Visit (INDEPENDENT_AMBULATORY_CARE_PROVIDER_SITE_OTHER): Payer: PPO | Admitting: Family Medicine

## 2017-04-04 ENCOUNTER — Encounter: Payer: Self-pay | Admitting: Family Medicine

## 2017-04-04 VITALS — BP 118/74 | HR 72 | Temp 97.7°F | Resp 16 | Ht 64.0 in | Wt 169.5 lb

## 2017-04-04 DIAGNOSIS — R3911 Hesitancy of micturition: Secondary | ICD-10-CM | POA: Diagnosis not present

## 2017-04-04 DIAGNOSIS — M545 Low back pain, unspecified: Secondary | ICD-10-CM

## 2017-04-04 LAB — POCT URINALYSIS DIPSTICK
Bilirubin, UA: NEGATIVE
Blood, UA: NEGATIVE
Glucose, UA: NEGATIVE
Ketones, UA: NEGATIVE
Nitrite, UA: NEGATIVE
Spec Grav, UA: 1.02 (ref 1.010–1.025)
Urobilinogen, UA: 0.2 E.U./dL
pH, UA: 6.5 (ref 5.0–8.0)

## 2017-04-04 MED ORDER — CIPROFLOXACIN HCL 250 MG PO TABS: 250.0000 mg | ORAL_TABLET | Freq: Two times a day (BID) | ORAL | 0 refills | Status: DC

## 2017-04-04 MED ORDER — PHENAZOPYRIDINE HCL 100 MG PO TABS: 100.0000 mg | ORAL_TABLET | Freq: Three times a day (TID) | ORAL | 0 refills | Status: DC | PRN

## 2017-04-04 NOTE — Progress Notes (Signed)
Name: Dawn Werner   MRN: [id number]    DOB: Jun 02, 1951   Date:04/04/2017       Progress Note  Subjective  Chief Complaint  Chief Complaint  Patient presents with  . Back Pain    Onset-1 week, has been having lower back pain and lower abdominal pain. Also alot of pressure when having to use the bathroom. When to [facility] and was told she had kidney stones and was given Bactrim with no relief.  . Urinary Retention    HPI  UTI: she went to Urgent Care on 03/26/2017 for low abdominal pain and also dysuria, frequency. She was given Septra for 5 days, she finished medication but continues to have lower back discomfort and pain on supra pubic area, mild burning, urgency and hesitancy. She states dysuria resolved, no blood in urine, fever, chills, nausea, vomiting or change in appetite. Glucose has been normal at home.    Patient Active Problem List   Diagnosis Date Noted  . 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 Derangement of posterior horn of lateral meniscus of right knee 01/10/2016  . 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 Primary osteoarthritis of knee 01/24/2015  . Asthma, mild intermittent 11/18/2014  . Arteriosclerosis of coronary artery 11/18/2014  . Insomnia, persistent 11/18/2014  . Dyslipidemia 11/18/2014  . Acid reflux 11/18/2014  . Diabetes type 2, controlled (HCC) 11/18/2014  . Genital herpes in women 11/18/2014  . Hypertension 11/18/2014  . Asymptomatic postmenopausal status 11/18/2014  . History of acute myocardial infarction 11/18/2014    Past Surgical History:  Procedure Laterality Date  . ABDOMINAL HYSTERECTOMY    . BREAST SURGERY     breast reduction  . REDUCTION MAMMAPLASTY Bilateral 1995  . TUBAL LIGATION      Family History  Problem Relation Age of Onset  . Hyperlipidemia Mother   . Hypertension Mother   . Stroke Mother   . Dementia Father   . Cancer Sister        breast  . Breast cancer Sister 13  . Asthma Brother   . Depression Brother        bipolar  . Heart disease Brother   . Heart disease Brother     Social History   Social History  . Marital status: Married    Spouse name: N/A  . Number of children: N/A  . Years of education: N/A   Occupational History  . Not on file.   Social History Main Topics  . Smoking status: Never Smoker  . Smokeless tobacco: Never Used  . Alcohol use No  . Drug use: No  . Sexual activity: Yes    Partners: Male   Other Topics Concern  . Not on file   Social History Narrative  . No narrative on file     Current Outpatient Prescriptions:  .  acyclovir ointment (ZOVIRAX) 5 %, Apply 1 application topically 2 (two) times daily as needed. Reported on 06/13/2015, Disp: , Rfl:  .  albuterol (PROVENTIL HFA) 108 (90 BASE) MCG/ACT inhaler, Inhale 2 puffs into the lungs as needed., Disp: , Rfl:  .  aspirin EC 81 MG tablet, Take 1 tablet by mouth daily., Disp: , Rfl:  .  beclomethasone (QVAR) 80 MCG/ACT inhaler, Inhale 1 puff into the lungs as needed., Disp: , Rfl:  .  carvedilol (COREG) 6.25 MG tablet, Take 1 tablet (6.25 mg total) by mouth 2 (two) times daily., Disp: 180 tablet, Rfl: 1 .  colchicine 0.6 MG tablet, Take 1  tablet (0.6 mg total) by mouth daily. Take two at onset of gout and repeat in 1 hour, prn, Disp: 30 tablet, Rfl: 0 .  Elastic Bandages & Supports (TENNIS ELBOW NEOPRENE BRACE) MISC, 1 Units by Does not apply route daily., Disp: 1 each, Rfl: 0 .  Insulin Pen Needle (NOVOFINE) 32G X 6 MM MISC, 1 each by Does not apply route 2 (two) times daily., Disp: 200 each, Rfl: 1 .  losartan (COZAAR) 50 MG tablet, Take 1 tablet (50 mg total) by mouth daily., Disp: 90 tablet, Rfl: 1 .  rosuvastatin (CRESTOR) 40 MG tablet, TAKE 1 TABLET (40 MG TOTAL) BY MOUTH DAILY., Disp: 90 tablet, Rfl: 1 .  valACYclovir (VALTREX) 500 MG tablet, TAKE 1 TABLET (500 MG TOTAL) BY MOUTH DAILY., Disp: 90 tablet, Rfl: 1 .  zolpidem (AMBIEN) 10 MG tablet, Take 1 tablet (10 mg total) by mouth at bedtime., Disp:  30 tablet, Rfl: 3 .  ciprofloxacin (CIPRO) 250 MG tablet, Take 1 tablet (250 mg total) by mouth 2 (two) times daily., Disp: 6 tablet, Rfl: 0 .  sulfamethoxazole-trimethoprim (BACTRIM DS,SEPTRA DS) 800-160 MG tablet, Take 1 tablet by mouth 2 (two) times daily. (Patient not taking: Reported on 04/04/2017), Disp: 10 tablet, Rfl: 0  Allergies  Allergen Reactions  . Metformin And Related Diarrhea    And nausea     ROS  Ten systems reviewed and is negative except as mentioned in HPI   Objective  Vitals:   04/04/17 0925  BP: 118/74  Pulse: 72  Resp: 16  Temp: 97.7 F (36.5 C)  TempSrc: Oral  SpO2: 95%  Weight: 169 lb 8 oz (76.9 kg)  Height: 5\' 4"  (1.626 m)    Body mass index is 29.09 kg/m.  Physical Exam  Constitutional: Patient appears well-developed and well-nourished. Overweight.  No distress.  HEENT: head atraumatic, normocephalic, pupils equal and reactive to light, neck supple, throat within normal limits Cardiovascular: Normal rate, regular rhythm and normal heart sounds.  No murmur heard. No BLE edema. Pulmonary/Chest: Effort normal and breath sounds normal. No respiratory distress. Abdominal: Soft.  There is no tenderness. Negative CVA Psychiatric: Patient has a normal mood and affect. behavior is normal. Judgment and thought content normal.  Recent Results (from the past 2160 hour(s))  POCT HgB A1C     Status: Abnormal   Collection Time: 01/29/17  2:44 PM  Result Value Ref Range   Hemoglobin A1C 7.3%   Urinalysis, Complete w Microscopic     Status: Abnormal   Collection Time: 03/26/17  6:41 PM  Result Value Ref Range   Color, Urine YELLOW YELLOW   APPearance CLEAR CLEAR   Specific Gravity, Urine 1.020 1.005 - 1.030   pH 5.5 5.0 - 8.0   Glucose, UA NEGATIVE NEGATIVE mg/dL   Hgb urine dipstick TRACE (A) NEGATIVE   Bilirubin Urine NEGATIVE NEGATIVE   Ketones, ur TRACE (A) NEGATIVE mg/dL   Protein, ur NEGATIVE NEGATIVE mg/dL   Nitrite NEGATIVE NEGATIVE    Leukocytes, UA NEGATIVE NEGATIVE   Squamous Epithelial / LPF 6-30 (A) NONE SEEN   WBC, UA 0-5 0 - 5 WBC/hpf   RBC / HPF 0-5 0 - 5 RBC/hpf   Bacteria, UA RARE (A) NONE SEEN   Ca Oxalate Crys, UA PRESENT   POCT urinalysis dipstick     Status: Abnormal   Collection Time: 04/04/17  9:38 AM  Result Value Ref Range   Color, UA yellow    Clarity, UA cloudy  Glucose, UA neg    Bilirubin, UA neg    Ketones, UA neg    Spec Grav, UA 1.020 1.010 - 1.025   Blood, UA neg    pH, UA 6.5 5.0 - 8.0   Protein, UA trace    Urobilinogen, UA 0.2 0.2 or 1.0 E.U./dL   Nitrite, UA neg    Leukocytes, UA Moderate (2+) (A) Negative      PHQ2/9: Depression screen [facility] 2/9 09/27/2016 09/04/2016 06/24/2016 01/10/2016 09/11/2015  Decreased Interest 0 0 0 0 0  Down, Depressed, Hopeless 0 0 0 0 0  PHQ - 2 Score 0 0 0 0 0     Fall Risk: Fall Risk  09/27/2016 09/04/2016 06/24/2016 01/10/2016 09/11/2015  Falls in the past year? No No No No No      Assessment & Plan  1. Acute bilateral low back pain without sciatica  - POCT urinalysis dipstick  2. Urinary hesitancy  - POCT urinalysis dipstick - CULTURE, URINE COMPREHENSIVE - ciprofloxacin (CIPRO) 250 MG tablet; Take 1 tablet (250 mg total) by mouth 2 (two) times daily.  Dispense: 6 tablet; Refill: 0 - phenazopyridine (PYRIDIUM) 100 MG tablet; Take 1 tablet (100 mg total) by mouth 3 (three) times daily as needed for pain.  Dispense: 10 tablet; Refill: 0

## 2017-04-07 NOTE — Progress Notes (Signed)
Lab will add test.

## 2017-04-09 ENCOUNTER — Other Ambulatory Visit: Payer: Self-pay | Admitting: Family Medicine

## 2017-04-09 LAB — CULTURE, URINE COMPREHENSIVE
MICRO NUMBER:: 81203831
SPECIMEN QUALITY:: ADEQUATE

## 2017-04-09 MED ORDER — NITROFURANTOIN MONOHYD MACRO 100 MG PO CAPS: 100.0000 mg | ORAL_CAPSULE | Freq: Two times a day (BID) | ORAL | 0 refills | Status: DC

## 2017-06-11 ENCOUNTER — Ambulatory Visit (INDEPENDENT_AMBULATORY_CARE_PROVIDER_SITE_OTHER): Payer: PPO | Admitting: Family Medicine

## 2017-06-11 ENCOUNTER — Encounter: Payer: Self-pay | Admitting: Family Medicine

## 2017-06-11 VITALS — BP 124/72 | HR 75 | Resp 14 | Ht 64.0 in | Wt 166.6 lb

## 2017-06-11 DIAGNOSIS — I2510 Atherosclerotic heart disease of native coronary artery without angina pectoris: Secondary | ICD-10-CM

## 2017-06-11 DIAGNOSIS — R809 Proteinuria, unspecified: Secondary | ICD-10-CM

## 2017-06-11 DIAGNOSIS — G4700 Insomnia, unspecified: Secondary | ICD-10-CM

## 2017-06-11 DIAGNOSIS — J4520 Mild intermittent asthma, uncomplicated: Secondary | ICD-10-CM

## 2017-06-11 DIAGNOSIS — I252 Old myocardial infarction: Secondary | ICD-10-CM

## 2017-06-11 DIAGNOSIS — E785 Hyperlipidemia, unspecified: Secondary | ICD-10-CM

## 2017-06-11 DIAGNOSIS — E1129 Type 2 diabetes mellitus with other diabetic kidney complication: Secondary | ICD-10-CM

## 2017-06-11 DIAGNOSIS — I10 Essential (primary) hypertension: Secondary | ICD-10-CM

## 2017-06-11 LAB — POCT GLYCOSYLATED HEMOGLOBIN (HGB A1C): Hemoglobin A1C: 6.9

## 2017-06-11 MED ORDER — ROSUVASTATIN CALCIUM 40 MG PO TABS: ORAL_TABLET | ORAL | 1 refills | Status: DC

## 2017-06-11 MED ORDER — LOSARTAN POTASSIUM 50 MG PO TABS: 50.0000 mg | ORAL_TABLET | Freq: Every day | ORAL | 1 refills | Status: DC

## 2017-06-11 MED ORDER — CARVEDILOL 6.25 MG PO TABS: 6.2500 mg | ORAL_TABLET | Freq: Two times a day (BID) | ORAL | 1 refills | Status: DC

## 2017-06-11 MED ORDER — ZOLPIDEM TARTRATE 10 MG PO TABS: 10.0000 mg | ORAL_TABLET | Freq: Every day | ORAL | 0 refills | Status: DC

## 2017-06-11 NOTE — Progress Notes (Signed)
Name: Dawn Werner   MRN: [id number]    DOB: [birth year]   Date:06/11/2017       Progress Note  Subjective  Chief Complaint  Chief Complaint  Patient presents with  . Diabetes  . Hyperlipidemia  . Hypertension    HPI   DMII: she has not been taking medication, metformin caused diarrhea, we gave her Victoza because of spiked glucose back in January 2018, she took for about one month, changed her diet and stopped medication because of cost. She has been exercising daily, walking outside,  and hgbA1C went down from 7.8% to 7.3%, up to 7.4%,  7.3% today at 6.9%   She does not want to take any medications at this time, urine micro negative last time, but has history of nephropathy and is on ARB, no neuropathy, eye exam is up to date. FSBS at home has been better, gets down to 80-85  usually not higher than 150  Hyperlipidemia: she is back on Crestor 40mg  and states no longer has muscle pain or cramps. No chest pain, LDL has improved HDL is above 40  Insomnia: she was trying to wean self off of Ambien, she has 10 mg tablets, and currently taking a bite off her pills, but states Ambien 5 mg is more expensive and not enough, she thinks she is taking about 7 mg   HTN: No chest pain, palpitation or SOB,taking medication as prescribed. She states bp at home has been well controlled 120's/70's   CAD: no episodes, s/p history of MI in 2009, taking aspirin daily, beta-blocker and statin therapy. No chest pain, no decrease in exercise tolerance, she is going to join a gym through [employer].   Asthma Mild Intermittent: she states that only occasionally has to use inhaler.She has occasional cough, no wheezing or SOB at this time. No decrease in exercise tolerance, she does not want to have spirometry at this time. She had a little flare a couple of weeks ago but is doing better now   OA right knee: no longer has pain, avoiding high hills and is active   Patient Active Problem List    Diagnosis Date Noted  . 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 Derangement of posterior horn of lateral meniscus of right knee 01/10/2016  . 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 Primary osteoarthritis of knee 01/24/2015  . Asthma, mild intermittent 11/18/2014  . Arteriosclerosis of coronary artery 11/18/2014  . Insomnia, persistent 11/18/2014  . Dyslipidemia 11/18/2014  . Acid reflux 11/18/2014  . Diabetes type 2, controlled (HCC) 11/18/2014  . Genital herpes in women 11/18/2014  . Hypertension 11/18/2014  . Asymptomatic postmenopausal status 11/18/2014  . History of acute myocardial infarction 11/18/2014    Past Surgical History:  Procedure Laterality Date  . ABDOMINAL HYSTERECTOMY    . BREAST SURGERY     breast reduction  . REDUCTION MAMMAPLASTY Bilateral 1995  . TUBAL LIGATION      Family History  Problem Relation Age of Onset  . Hyperlipidemia Mother   . Hypertension Mother   . Stroke Mother   . Dementia Father   . Cancer Sister        breast  . Breast cancer Sister 23  . Asthma Brother   . Depression Brother        bipolar  . Heart disease Brother   . Heart disease Brother     Social History   Socioeconomic History  . Marital status: Married    Spouse name: Not on  file  . Number of children: Not on file  . Years of education: Not on file  . Highest education level: Not on file  Social Needs  . Financial resource strain: Not on file  . Food insecurity - worry: Not on file  . Food insecurity - inability: Not on file  . Transportation needs - medical: Not on file  . Transportation needs - non-medical: Not on file  Occupational History  . Not on file  Tobacco Use  . Smoking status: Never Smoker  . Smokeless tobacco: Never Used  Substance and Sexual Activity  . Alcohol use: No    Alcohol/week: 0.0 oz  . Drug use: No  . Sexual activity: Yes    Partners: Male  Other Topics Concern  . Not on file  Social History Narrative  . Not on file     Current  Outpatient Medications:  .  acyclovir ointment (ZOVIRAX) 5 %, Apply 1 application topically 2 (two) times daily as needed. Reported on 06/13/2015, Disp: , Rfl:  .  albuterol (PROVENTIL HFA) 108 (90 BASE) MCG/ACT inhaler, Inhale 2 puffs into the lungs as needed., Disp: , Rfl:  .  aspirin EC 81 MG tablet, Take 1 tablet by mouth daily., Disp: , Rfl:  .  carvedilol (COREG) 6.25 MG tablet, Take 1 tablet (6.25 mg total) by mouth 2 (two) times daily., Disp: 180 tablet, Rfl: 1 .  colchicine 0.6 MG tablet, Take 1 tablet (0.6 mg total) by mouth daily. Take two at onset of gout and repeat in 1 hour, prn, Disp: 30 tablet, Rfl: 0 .  losartan (COZAAR) 50 MG tablet, Take 1 tablet (50 mg total) by mouth daily., Disp: 90 tablet, Rfl: 1 .  rosuvastatin (CRESTOR) 40 MG tablet, TAKE 1 TABLET (40 MG TOTAL) BY MOUTH DAILY., Disp: 90 tablet, Rfl: 1 .  valACYclovir (VALTREX) 500 MG tablet, TAKE 1 TABLET (500 MG TOTAL) BY MOUTH DAILY., Disp: 90 tablet, Rfl: 1 .  zolpidem (AMBIEN) 10 MG tablet, Take 1 tablet (10 mg total) by mouth at bedtime., Disp: 30 tablet, Rfl: 3 .  omeprazole (PRILOSEC) 20 MG capsule, Take 1 capsule by mouth daily as needed., Disp: , Rfl:   Allergies  Allergen Reactions  . Metformin And Related Diarrhea    And nausea     ROS  Constitutional: Negative for fever or weight change.  Respiratory: Negative for cough and shortness of breath.   Cardiovascular: Negative for chest pain or palpitations.  Gastrointestinal: Negative for abdominal pain, no bowel changes.  Musculoskeletal: Negative for gait problem or joint swelling.  Skin: Negative for rash.  Neurological: Negative for dizziness or headache.  No other specific complaints in a complete review of systems (except as listed in HPI above).  Objective  Vitals:   06/11/17 0949  BP: 124/72  Pulse: 75  Resp: 14  SpO2: 98%  Weight: 166 lb 9.6 oz (75.6 kg)  Height: 5\' 4"  (1.626 m)    Body mass index is 28.6 kg/m.  Physical  Exam  Constitutional: Patient appears well-developed and well-nourished. Obese  No distress.  HEENT: head atraumatic, normocephalic, pupils equal and reactive to light,  neck supple, throat within normal limits Cardiovascular: Normal rate, regular rhythm and normal heart sounds.  No murmur heard. No BLE edema. Pulmonary/Chest: Effort normal and breath sounds normal. No respiratory distress. Abdominal: Soft.  There is no tenderness. Psychiatric: Patient has a normal mood and affect. behavior is normal. Judgment and thought content normal.    Recent Results (  from the past 2160 hour(s))  Urinalysis, Complete w Microscopic     Status: Abnormal   Collection Time: 03/26/17  6:41 PM  Result Value Ref Range   Color, Urine YELLOW YELLOW   APPearance CLEAR CLEAR   Specific Gravity, Urine 1.020 1.005 - 1.030   pH 5.5 5.0 - 8.0   Glucose, UA NEGATIVE NEGATIVE mg/dL   Hgb urine dipstick TRACE (A) NEGATIVE   Bilirubin Urine NEGATIVE NEGATIVE   Ketones, ur TRACE (A) NEGATIVE mg/dL   Protein, ur NEGATIVE NEGATIVE mg/dL   Nitrite NEGATIVE NEGATIVE   Leukocytes, UA NEGATIVE NEGATIVE   Squamous Epithelial / LPF 6-30 (A) NONE SEEN   WBC, UA 0-5 0 - 5 WBC/hpf   RBC / HPF 0-5 0 - 5 RBC/hpf   Bacteria, UA RARE (A) NONE SEEN   Ca Oxalate Crys, UA PRESENT   POCT urinalysis dipstick     Status: Abnormal   Collection Time: 04/04/17  9:38 AM  Result Value Ref Range   Color, UA yellow    Clarity, UA cloudy    Glucose, UA neg    Bilirubin, UA neg    Ketones, UA neg    Spec Grav, UA 1.020 1.010 - 1.025   Blood, UA neg    pH, UA 6.5 5.0 - 8.0   Protein, UA trace    Urobilinogen, UA 0.2 0.2 or 1.0 E.U./dL   Nitrite, UA neg    Leukocytes, UA Moderate (2+) (A) Negative  CULTURE, URINE COMPREHENSIVE     Status: Abnormal   Collection Time: 04/04/17  4:24 PM  Result Value Ref Range   MICRO NUMBER: [id number]    SPECIMEN QUALITY: ADEQUATE    Source OTHER (SPECIFY)    STATUS: FINAL    ISOLATE 1: Genus  Enterococcus (A)    ISOLATE 2: Corynebacterium species (A)       Susceptibility   Genus enterococcus - CULT, URN, SPECIAL POSITIVE 1    AMPICILLIN <=2 Sensitive     VANCOMYCIN 1 Sensitive     NITROFURANTOIN* <=16 Sensitive      * Legend:S = Susceptible  I = IntermediateR = Resistant  NS = Not susceptible* = Not tested  NR = Not reported**NN = See antimicrobic comments  POCT HgB A1C     Status: Abnormal   Collection Time: 06/11/17 10:07 AM  Result Value Ref Range   Hemoglobin A1C 6.9     PHQ2/9: Depression screen [facility] 2/9 09/27/2016 09/04/2016 06/24/2016 01/10/2016 09/11/2015  Decreased Interest 0 0 0 0 0  Down, Depressed, Hopeless 0 0 0 0 0  PHQ - 2 Score 0 0 0 0 0     Fall Risk: Fall Risk  06/11/2017 09/27/2016 09/04/2016 06/24/2016 01/10/2016  Falls in the past year? No No No No No     Functional Status Survey: Is the patient deaf or have difficulty hearing?: No Does the patient have difficulty seeing, even when wearing glasses/contacts?: No Does the patient have difficulty concentrating, remembering, or making decisions?: No Does the patient have difficulty walking or climbing stairs?: No Does the patient have difficulty dressing or bathing?: No Does the patient have difficulty doing errands alone such as visiting a doctor's office or shopping?: No    Assessment & Plan  1. Controlled type 2 diabetes mellitus with microalbuminuria, without long-term current use of insulin (HCC)  - POCT HgB A1C - losartan (COZAAR) 50 MG tablet; Take 1 tablet (50 mg total) by mouth daily.  Dispense: 90 tablet; Refill: 1  2. Dyslipidemia  - rosuvastatin (CRESTOR) 40 MG tablet; TAKE 1 TABLET (40 MG TOTAL) BY MOUTH DAILY.  Dispense: 90 tablet; Refill: 1  3. Arteriosclerosis of coronary artery  Continue medication  4. Insomnia, persistent  - zolpidem (AMBIEN) 10 MG tablet; Take 1 tablet (10 mg total) by mouth at bedtime.  Dispense: 90 tablet; Refill: 0  5. Essential hypertension  - carvedilol  (COREG) 6.25 MG tablet; Take 1 tablet (6.25 mg total) by mouth 2 (two) times daily.  Dispense: 180 tablet; Refill: 1 - losartan (COZAAR) 50 MG tablet; Take 1 tablet (50 mg total) by mouth daily.  Dispense: 90 tablet; Refill: 1  6. Mild intermittent asthma without complication  Doing well on prn medication   7. History of acute myocardial infarction  - carvedilol (COREG) 6.25 MG tablet; Take 1 tablet (6.25 mg total) by mouth 2 (two) times daily.  Dispense: 180 tablet; Refill: 1 - losartan (COZAAR) 50 MG tablet; Take 1 tablet (50 mg total) by mouth daily.  Dispense: 90 tablet; Refill: 1 - rosuvastatin (CRESTOR) 40 MG tablet; TAKE 1 TABLET (40 MG TOTAL) BY MOUTH DAILY.  Dispense: 90 tablet; Refill: 1

## 2017-07-21 DIAGNOSIS — M1712 Unilateral primary osteoarthritis, left knee: Secondary | ICD-10-CM | POA: Diagnosis not present

## 2017-08-25 ENCOUNTER — Telehealth: Payer: Self-pay | Admitting: Family Medicine

## 2017-08-25 NOTE — Telephone Encounter (Signed)
Copied from CRM [phone]. Topic: Inquiry >> Aug 25, 2017 12:48 PM Yvonna Alanisobinson, Andra M wrote: Reason for CRM: Patient called requesting a refill of Zolpidem (AMBIEN) 10 MG tablet. Patient called the pharmacy. Patient's preferred pharmacy is [id number] - [place], [place] - [address] [phone] (Phone)    [phone] (Fax).

## 2017-08-26 ENCOUNTER — Encounter: Payer: Self-pay | Admitting: Family Medicine

## 2017-08-26 ENCOUNTER — Ambulatory Visit (INDEPENDENT_AMBULATORY_CARE_PROVIDER_SITE_OTHER): Payer: PPO | Admitting: Family Medicine

## 2017-08-26 VITALS — BP 124/80 | HR 64 | Temp 97.5°F | Resp 16 | Ht 64.0 in | Wt 169.0 lb

## 2017-08-26 DIAGNOSIS — R109 Unspecified abdominal pain: Secondary | ICD-10-CM

## 2017-08-26 MED ORDER — DICYCLOMINE HCL 20 MG PO TABS: 20.0000 mg | ORAL_TABLET | Freq: Two times a day (BID) | ORAL | 0 refills | Status: DC | PRN

## 2017-08-26 NOTE — Telephone Encounter (Signed)
Refill request.  Thanks

## 2017-08-26 NOTE — Progress Notes (Signed)
Name: Dawn Werner   MRN: [id number]    DOB: [birth year]   Date:08/26/2017       Progress Note  Subjective  Chief Complaint  Chief Complaint  Patient presents with  . Abdominal Pain    cramps, diarrhea, vomiting for 1 week  . Medication Problem    HPI  Pt reports 1 week ago she became ill with diarrhea, abdominal cramping, and vomiting for about 3 days, the vomiting and diarrhea have since resolved, but she continues to have abdominal cramping. Cramping has not occurred today, but had been occurring a few times a day for the last 3-4 days - typically occurs int he LLQ.  Did have low-grade fever at home. Denies nausea, chest pain or shortness of breath, blood in stool, dark and tarry stools, or mucous in stools .  Appetite has been improving.  Takes omeprazole 20mg  daily.  Patient Active Problem List   Diagnosis Date Noted  . 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 Derangement of posterior horn of lateral meniscus of right knee 01/10/2016  . 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 Primary osteoarthritis of knee 01/24/2015  . Asthma, mild intermittent 11/18/2014  . Arteriosclerosis of coronary artery 11/18/2014  . Insomnia, persistent 11/18/2014  . Dyslipidemia 11/18/2014  . Acid reflux 11/18/2014  . Diabetes type 2, controlled (HCC) 11/18/2014  . Genital herpes in women 11/18/2014  . Hypertension 11/18/2014  . Asymptomatic postmenopausal status 11/18/2014  . History of acute myocardial infarction 11/18/2014    Social History   Tobacco Use  . Smoking status: Never Smoker  . Smokeless tobacco: Never Used  Substance Use Topics  . Alcohol use: No    Alcohol/week: 0.0 oz     Current Outpatient Medications:  .  acyclovir ointment (ZOVIRAX) 5 %, Apply 1 application topically 2 (two) times daily as needed. Reported on 06/13/2015, Disp: , Rfl:  .  albuterol (PROVENTIL HFA) 108 (90 BASE) MCG/ACT inhaler, Inhale 2 puffs into the lungs as needed., Disp: , Rfl:  .  aspirin EC 81 MG tablet, Take  1 tablet by mouth daily., Disp: , Rfl:  .  carvedilol (COREG) 6.25 MG tablet, Take 1 tablet (6.25 mg total) by mouth 2 (two) times daily., Disp: 180 tablet, Rfl: 1 .  colchicine 0.6 MG tablet, Take 1 tablet (0.6 mg total) by mouth daily. Take two at onset of gout and repeat in 1 hour, prn, Disp: 30 tablet, Rfl: 0 .  losartan (COZAAR) 50 MG tablet, Take 1 tablet (50 mg total) by mouth daily., Disp: 90 tablet, Rfl: 1 .  omeprazole (PRILOSEC) 20 MG capsule, Take 1 capsule by mouth daily as needed., Disp: , Rfl:  .  rosuvastatin (CRESTOR) 40 MG tablet, TAKE 1 TABLET (40 MG TOTAL) BY MOUTH DAILY., Disp: 90 tablet, Rfl: 1 .  valACYclovir (VALTREX) 500 MG tablet, TAKE 1 TABLET (500 MG TOTAL) BY MOUTH DAILY., Disp: 90 tablet, Rfl: 1 .  zolpidem (AMBIEN) 10 MG tablet, Take 1 tablet (10 mg total) by mouth at bedtime., Disp: 90 tablet, Rfl: 0  Allergies  Allergen Reactions  . Metformin And Related Diarrhea    And nausea    ROS  Constitutional: Negative for fever or weight change.  Respiratory: Negative for cough and shortness of breath.   Cardiovascular: Negative for chest pain or palpitations.  Gastrointestinal: See HPI  Musculoskeletal: Negative for gait problem or joint swelling.  Skin: Negative for rash.  Neurological: Negative for dizziness or headache.  No other specific complaints in a complete  review of systems (except as listed in HPI above).  Objective  Vitals:   08/26/17 1128  BP: 124/80  Pulse: 64  Resp: 16  Temp: (!) 97.5 F (36.4 C)  TempSrc: Oral  SpO2: 97%  Weight: 169 lb (76.7 kg)  Height: 5\' 4"  (1.626 m)   Body mass index is 29.01 kg/m.  Nursing Note and Vital Signs reviewed.  Physical Exam  Constitutional: Patient appears well-developed and well-nourished. Obese No distress.  HEENT: head atraumatic, normocephalic Cardiovascular: Normal rate, regular rhythm, S1/S2 present.  No murmur or rub heard. No BLE edema. Pulmonary/Chest: Effort normal and breath sounds  clear. No respiratory distress or retractions. Abdominal: Soft and non-tender, bowel sounds present x4 quadrants.  No CVA Tenderness Psychiatric: Patient has a normal mood and affect. behavior is normal. Judgment and thought content normal.  No results found for this or any previous visit (from the past 72 hour(s)).  Assessment & Plan  1. Abdominal cramping - dicyclomine (BENTYL) 20 MG tablet; Take 1 tablet (20 mg total) by mouth 2 (two) times daily as needed for spasms.  Dispense: 20 tablet; Refill: 0 - Advised if symptoms return or worsen, she needs to schedule close follow up.  Symptoms appear to be significantly improving, so we will hold off on labs/imaging at this time.   -Red flags and when to present for emergency care or RTC including fever >101.35F, chest pain, shortness of breath, new/worsening/un-resolving symptoms, abdominal pain, vomiting/diarrhea, blood in stool, dark and tarry stools, or mucous in stools reviewed with patient at time of visit. Follow up and care instructions discussed and provided in AVS.

## 2017-08-26 NOTE — Telephone Encounter (Signed)
Patient asked about Ambien refill in her appointment today - I will defer to you for refills of this medication. Thanks!

## 2017-08-26 NOTE — Telephone Encounter (Signed)
Patient states she is unable to make her 90 day prescription last until May when her next appointment is.  Patient is right on schedule taking her pills to be filled the beginning of April. She is asking for a 30 day supply to last her until her next appointment. She is unable to wean herself off of the medication due to starting a new job and still chewing off a little more than half of the pill around 7 mg. Some nights she is able to take half depending on the night. She apologizes for panicking and just wanted to be ahead of schedule.

## 2017-08-26 NOTE — Telephone Encounter (Signed)
She was given 90 days in January, will need a 3 month follow up if unable to make medication last until next visit. Thank you

## 2017-08-26 NOTE — Telephone Encounter (Signed)
She can send a request two days before she is out of medication

## 2017-08-27 NOTE — Telephone Encounter (Signed)
Patient notified Dr. Carlynn PurlSowles would give her a 30 day supply the beginning of April to gap the whole until her next appointment on Oct 10, 2017. Patient verbalized understanding to call us 2 days before her medication runs out and then Dr. Carlynn PurlSowles will send a 30 day prescription of Ambien into her pharmacy.

## 2017-09-04 ENCOUNTER — Other Ambulatory Visit: Payer: Self-pay | Admitting: Family Medicine

## 2017-09-04 DIAGNOSIS — G4700 Insomnia, unspecified: Secondary | ICD-10-CM

## 2017-09-04 NOTE — Telephone Encounter (Signed)
[facility] general [facility]  Last office visit: 06/11/2017   Follow up on 10/10/2017

## 2017-09-05 ENCOUNTER — Other Ambulatory Visit: Payer: Self-pay | Admitting: Family Medicine

## 2017-09-05 DIAGNOSIS — Z1231 Encounter for screening mammogram for malignant neoplasm of breast: Secondary | ICD-10-CM

## 2017-09-15 ENCOUNTER — Ambulatory Visit
Admission: EM | Admit: 2017-09-15 | Discharge: 2017-09-15 | Disposition: A | Discharge status: Home / Self Care | Payer: PPO | Attending: Family Medicine | Admitting: Family Medicine

## 2017-09-15 ENCOUNTER — Other Ambulatory Visit: Payer: Self-pay

## 2017-09-15 DIAGNOSIS — J40 Bronchitis, not specified as acute or chronic: Secondary | ICD-10-CM

## 2017-09-15 DIAGNOSIS — J4521 Mild intermittent asthma with (acute) exacerbation: Secondary | ICD-10-CM | POA: Diagnosis not present

## 2017-09-15 MED ORDER — BENZONATATE 200 MG PO CAPS: 200.0000 mg | ORAL_CAPSULE | Freq: Three times a day (TID) | ORAL | 0 refills | Status: DC | PRN

## 2017-09-15 MED ORDER — DOXYCYCLINE HYCLATE 100 MG PO TABS: 100.0000 mg | ORAL_TABLET | Freq: Two times a day (BID) | ORAL | 0 refills | Status: DC

## 2017-09-15 NOTE — ED Triage Notes (Signed)
Patient complains of cough and congestion x 3 days. Patient denies fever.

## 2017-09-15 NOTE — ED Provider Notes (Signed)
[facility]    CSN: [id number] Arrival date & time: 09/15/17  1001     History   Chief Complaint Chief Complaint  Patient presents with  . Cough    HPI Dawn Werner is a 67 y.o. female.   The history is provided by the patient.  Cough  Associated symptoms: wheezing   URI  Presenting symptoms: congestion and cough   Severity:  Moderate Onset quality:  Sudden Duration:  7 days Timing:  Constant Progression:  Worsening Chronicity:  New Relieved by:  Nothing Ineffective treatments:  Inhaler and OTC medications Associated symptoms: wheezing   Risk factors: being elderly, chronic respiratory disease, diabetes mellitus (asthma) and sick contacts   Risk factors: no immunosuppression and no recent travel     Past Medical History:  Diagnosis Date  . Anxiety   . Asthma   . Diabetes mellitus without complication (HCC)   . GERD (gastroesophageal reflux disease)   . Hyperlipidemia   . Insomnia     Patient Active Problem List   Diagnosis Date Noted  . 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 Derangement of posterior horn of lateral meniscus of right knee 01/10/2016  . 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 Primary osteoarthritis of knee 01/24/2015  . Asthma, mild intermittent 11/18/2014  . Arteriosclerosis of coronary artery 11/18/2014  . Insomnia, persistent 11/18/2014  . Dyslipidemia 11/18/2014  . Acid reflux 11/18/2014  . Diabetes type 2, controlled (HCC) 11/18/2014  . Genital herpes in women 11/18/2014  . Hypertension 11/18/2014  . Asymptomatic postmenopausal status 11/18/2014  . History of acute myocardial infarction 11/18/2014    Past Surgical History:  Procedure Laterality Date  . ABDOMINAL HYSTERECTOMY    . BREAST SURGERY     breast reduction  . REDUCTION MAMMAPLASTY Bilateral 1995  . TUBAL LIGATION      OB History   None      Home Medications    Prior to Admission medications   Medication Sig Start Date End Date Taking? Authorizing  Provider  acyclovir ointment (ZOVIRAX) 5 % Apply 1 application topically 2 (two) times daily as needed. Reported on 06/13/2015 04/28/14  Yes [provider]  albuterol (PROVENTIL HFA) 108 (90 BASE) MCG/ACT inhaler Inhale 2 puffs into the lungs as needed.   Yes [provider]  aspirin EC 81 MG tablet Take 1 tablet by mouth daily.   Yes [provider]  carvedilol (COREG) 6.25 MG tablet Take 1 tablet (6.25 mg total) by mouth 2 (two) times daily. 06/11/17  Yes Sowles, Danna Hefty, MD  colchicine 0.6 MG tablet Take 1 tablet (0.6 mg total) by mouth daily. Take two at onset of gout and repeat in 1 hour, prn 01/24/15  Yes Sowles, Danna Hefty, MD  dicyclomine (BENTYL) 20 MG tablet Take 1 tablet (20 mg total) by mouth 2 (two) times daily as needed for spasms. 08/26/17  Yes Doren Custard, FNP  losartan (COZAAR) 50 MG tablet Take 1 tablet (50 mg total) by mouth daily. 06/11/17  Yes Sowles, Danna Hefty, MD  omeprazole (PRILOSEC) 20 MG capsule Take 1 capsule by mouth daily as needed.   Yes [provider]  rosuvastatin (CRESTOR) 40 MG tablet TAKE 1 TABLET (40 MG TOTAL) BY MOUTH DAILY. 06/11/17  Yes Sowles, Danna Hefty, MD  valACYclovir (VALTREX) 500 MG tablet TAKE 1 TABLET (500 MG TOTAL) BY MOUTH DAILY. 08/04/16  Yes Sowles, Danna Hefty, MD  zolpidem (AMBIEN) 10 MG tablet TAKE 1 TABLET BY MOUTH AT BEDTIME 09/04/17  Yes Alba Cory, MD  benzonatate (TESSALON) 200 MG capsule Take 1 capsule (200 mg total) by mouth 3 (three) times daily as needed. 09/15/17   Payton Mccallumonty, Kevon Tench, MD  doxycycline (VIBRA-TABS) 100 MG tablet Take 1 tablet (100 mg total) by mouth 2 (two) times daily. 09/15/17   Payton Mccallumonty, Nekeshia Lenhardt, MD    Family History Family History  Problem Relation Age of Onset  . Hyperlipidemia Mother   . Hypertension Mother   . Stroke Mother   . Dementia Father   . Cancer Sister        breast  . Breast cancer Sister [id number]  . Asthma Brother   . Depression Brother        bipolar  . Heart disease Brother   .  Heart disease Brother     Social History Social History   Tobacco Use  . Smoking status: Never Smoker  . Smokeless tobacco: Never Used  Substance Use Topics  . Alcohol use: No    Alcohol/week: 0.0 oz  . Drug use: No     Allergies   Metformin and related   Review of Systems Review of Systems  HENT: Positive for congestion.   Respiratory: Positive for cough and wheezing.      Physical Exam Triage Vital Signs ED Triage Vitals  Enc Vitals Group     BP 09/15/17 1029 99/76     Pulse Rate 09/15/17 1029 82     Resp 09/15/17 1029 17     Temp 09/15/17 1029 98.2 F (36.8 C)     Temp Source 09/15/17 1029 Oral     SpO2 09/15/17 1029 98 %     Weight 09/15/17 1027 169 lb (76.7 kg)     Height 09/15/17 1027 5\' 4"  (1.626 m)     Head Circumference --      Peak Flow --      Pain Score 09/15/17 1027 5     Pain Loc --      Pain Edu? --      Excl. in GC? --    No data found.  Updated Vital Signs BP 99/76 (BP Location: Left Arm)   Pulse 82   Temp 98.2 F (36.8 C) (Oral)   Resp 17   Ht 5\' 4"  (1.626 m)   Wt 169 lb (76.7 kg)   SpO2 98%   BMI 29.01 kg/m   Visual Acuity Right Eye Distance:   Left Eye Distance:   Bilateral Distance:    Right Eye Near:   Left Eye Near:    Bilateral Near:     Physical Exam  Constitutional: She appears well-developed and well-nourished. No distress.  HENT:  Head: Normocephalic and atraumatic.  Right Ear: Tympanic membrane, external ear and ear canal normal.  Left Ear: Tympanic membrane, external ear and ear canal normal.  Nose: Rhinorrhea present. No mucosal edema, nose lacerations, sinus tenderness, nasal deformity, septal deviation or nasal septal hematoma. No epistaxis.  No foreign bodies. Right sinus exhibits no maxillary sinus tenderness and no frontal sinus tenderness. Left sinus exhibits no maxillary sinus tenderness and no frontal sinus tenderness.  Mouth/Throat: Uvula is midline, oropharynx is clear and moist and mucous membranes  are normal. No oropharyngeal exudate.  Eyes: Conjunctivae are normal. Right eye exhibits no discharge. Left eye exhibits no discharge. No scleral icterus.  Neck: Normal range of motion. Neck supple. No thyromegaly present.  Cardiovascular: Normal rate, regular rhythm and normal heart sounds.  Pulmonary/Chest: Effort normal. No stridor. No respiratory distress. She has wheezes (few, expiratory plus diffuse  rhochi). She has no rales.  Lymphadenopathy:    She has no cervical adenopathy.  Skin: She is not diaphoretic.  Nursing note and vitals reviewed.    UC Treatments / Results  Labs (all labs ordered are listed, but only abnormal results are displayed) Labs Reviewed - No data to display  EKG None Radiology No results found.  Procedures Procedures (including critical care time)  Medications Ordered in UC Medications - No data to display   Initial Impression / Assessment and Plan / UC Course  I have reviewed the triage vital signs and the nursing notes.  Pertinent labs & imaging results that were available during my care of the patient were reviewed by me and considered in my medical decision making (see chart for details).       Final Clinical Impressions(s) / UC Diagnoses   Final diagnoses:  Mild intermittent asthmatic bronchitis with acute exacerbation    ED Discharge Orders        Ordered    doxycycline (VIBRA-TABS) 100 MG tablet  2 times daily     09/15/17 1113    benzonatate (TESSALON) 200 MG capsule  3 times daily PRN     09/15/17 1113     1. diagnosis reviewed with patient 2. rx as per orders above; reviewed possible side effects, interactions, risks and benefits  3. Recommend supportive treatment with rest, fluids  4. Follow-up prn if symptoms worsen or don't improve  Controlled Substance Prescriptions [place] Controlled Substance Registry consulted? Not Applicable   Payton Mccallum, MD 09/15/17 1124

## 2017-09-23 ENCOUNTER — Ambulatory Visit
Admission: RE | Admit: 2017-09-23 | Discharge: 2017-09-23 | Disposition: A | Discharge status: Home / Self Care | Payer: PPO | Source: Ambulatory Visit | Attending: Family Medicine | Admitting: Family Medicine

## 2017-09-23 DIAGNOSIS — Z1231 Encounter for screening mammogram for malignant neoplasm of breast: Secondary | ICD-10-CM | POA: Insufficient documentation

## 2017-10-09 ENCOUNTER — Encounter: Payer: PPO | Admitting: Family Medicine

## 2017-10-10 ENCOUNTER — Ambulatory Visit: Payer: PPO | Admitting: Family Medicine

## 2017-10-10 ENCOUNTER — Encounter: Payer: Self-pay | Admitting: Family Medicine

## 2017-10-10 ENCOUNTER — Ambulatory Visit (INDEPENDENT_AMBULATORY_CARE_PROVIDER_SITE_OTHER): Payer: PPO

## 2017-10-10 VITALS — BP 120/62 | HR 66 | Temp 97.1°F | Resp 12 | Ht 64.0 in | Wt 166.0 lb

## 2017-10-10 DIAGNOSIS — G4700 Insomnia, unspecified: Secondary | ICD-10-CM | POA: Diagnosis not present

## 2017-10-10 DIAGNOSIS — I252 Old myocardial infarction: Secondary | ICD-10-CM

## 2017-10-10 DIAGNOSIS — I2510 Atherosclerotic heart disease of native coronary artery without angina pectoris: Secondary | ICD-10-CM

## 2017-10-10 DIAGNOSIS — R809 Proteinuria, unspecified: Principal | ICD-10-CM

## 2017-10-10 DIAGNOSIS — K219 Gastro-esophageal reflux disease without esophagitis: Secondary | ICD-10-CM

## 2017-10-10 DIAGNOSIS — E785 Hyperlipidemia, unspecified: Secondary | ICD-10-CM | POA: Diagnosis not present

## 2017-10-10 DIAGNOSIS — Z0000 Encounter for general adult medical examination without abnormal findings: Secondary | ICD-10-CM

## 2017-10-10 DIAGNOSIS — I10 Essential (primary) hypertension: Secondary | ICD-10-CM

## 2017-10-10 DIAGNOSIS — J4521 Mild intermittent asthma with (acute) exacerbation: Secondary | ICD-10-CM

## 2017-10-10 DIAGNOSIS — E1129 Type 2 diabetes mellitus with other diabetic kidney complication: Secondary | ICD-10-CM

## 2017-10-10 LAB — COMPLETE METABOLIC PANEL WITH GFR
AG Ratio: 1.8 (calc) (ref 1.0–2.5)
ALT: 21 U/L (ref 6–29)
AST: 20 U/L (ref 10–35)
Albumin: 4.5 g/dL (ref 3.6–5.1)
Alkaline phosphatase (APISO): 66 U/L (ref 33–130)
BUN: 10 mg/dL (ref 7–25)
CO2: 28 mmol/L (ref 20–32)
Calcium: 9.4 mg/dL (ref 8.6–10.4)
Chloride: 104 mmol/L (ref 98–110)
Creat: 0.54 mg/dL (ref 0.50–0.99)
GFR, Est African American: 112 mL/min/{1.73_m2} (ref >=60)
GFR, Est Non African American: 97 mL/min/{1.73_m2} (ref >=60)
Globulin: 2.5 g/dL (calc) (ref 1.9–3.7)
Glucose, Bld: 132 mg/dL — ABNORMAL HIGH (ref 65–99)
Potassium: 3.9 mmol/L (ref 3.5–5.3)
Sodium: 140 mmol/L (ref 135–146)
Total Bilirubin: 0.5 mg/dL (ref 0.2–1.2)
Total Protein: 7 g/dL (ref 6.1–8.1)

## 2017-10-10 LAB — LIPID PANEL
Cholesterol: 170 mg/dL (ref <200)
HDL: 43 mg/dL — ABNORMAL LOW (ref >50)
LDL Cholesterol (Calc): 108 mg/dL (calc) — ABNORMAL HIGH
Non-HDL Cholesterol (Calc): 127 mg/dL (calc) (ref <130)
Total CHOL/HDL Ratio: 4 (calc) (ref <5.0)
Triglycerides: 97 mg/dL (ref <150)

## 2017-10-10 MED ORDER — LOSARTAN POTASSIUM 50 MG PO TABS: 50.0000 mg | ORAL_TABLET | Freq: Every day | ORAL | 1 refills | Status: DC

## 2017-10-10 MED ORDER — CARVEDILOL 6.25 MG PO TABS: 6.2500 mg | ORAL_TABLET | Freq: Two times a day (BID) | ORAL | 1 refills | Status: DC

## 2017-10-10 MED ORDER — ZOLPIDEM TARTRATE 10 MG PO TABS: 10.0000 mg | ORAL_TABLET | Freq: Every day | ORAL | 1 refills | Status: DC

## 2017-10-10 MED ORDER — ROSUVASTATIN CALCIUM 40 MG PO TABS: ORAL_TABLET | ORAL | 1 refills | Status: DC

## 2017-10-10 MED ORDER — FLUTICASONE FUROATE-VILANTEROL 100-25 MCG/INH IN AEPB: 1.0000 | INHALATION_SPRAY | Freq: Every day | RESPIRATORY_TRACT | 0 refills | Status: DC

## 2017-10-10 NOTE — Progress Notes (Addendum)
Subjective:   Dawn Werner is a 67 y.o. female who presents for Medicare Annual (Subsequent) preventive examination.  Review of Systems:  N/A Cardiac Risk Factors include: advanced age (>93men, >68 women);diabetes mellitus;dyslipidemia;hypertension;sedentary lifestyle     Objective:     Vitals: BP 120/62 (BP Location: Left Arm, Patient Position: Sitting, Cuff Size: Normal)   Pulse 66   Temp (!) 97.1 F (36.2 C) (Oral)   Resp 12   Ht  (1.626 m)   Wt 166 lb (75.3 kg)   SpO2 96%   BMI 28.49 kg/m   Body mass index is 28.49 kg/m.  Advanced Directives 10/10/2017 09/15/2017 04/04/2017 01/29/2017 10/10/2016 09/27/2016 07/09/2016  Does Patient Have a Medical Advance Directive? No No No No No No No  Would patient like information on creating a medical advance directive? Yes (MAU/Ambulatory/Procedural Areas - Information given) - - - - - No - Patient declined    Tobacco Social History   Tobacco Use  Smoking Status Never Smoker  Smokeless Tobacco Never Used  Tobacco Comment   smoking cessation materials not required     Counseling given: No Comment: smoking cessation materials not required   Clinical Intake:  Pre-visit preparation completed: Yes  Pain : No/denies pain Pain Score: 0-No pain   BMI - recorded: 28.99 Nutritional Status: BMI 25 -29 Overweight Nutrition Risk Assessment: Has the patient had any N/V/D within the last 2 months?  No Does the patient have any non-healing wounds?  No Has the patient had any unintentional weight loss or weight gain?  No  Is the patient diabetic?  Yes If diabetic, was a CBG obtained today?  No Did the patient bring in their glucometer from home?  No Comments: Pt does not monitor CBG's daily. Denies any financial strains with the device or supplies. States her diabetes is diet controlled.  Diabetic Exams: Diabetic Eye Exam: Completed 08/02/16. Pt states she is scheduled to be seen by Dr. Druscilla Brownie in the next couple of weeks.  Unable to recall exact date. Diabetic Foot Exam:Completed 01/29/17  How often do you need to have someone help you when you read instructions, pamphlets, or other written materials from your doctor or pharmacy?: 1 - Never  Interpreter Needed?: No  Information entered by :: AEversole, LPN  Hospitalizations/ED visits and surgeries occurring within the previous 12 months:  Within the previous 12 months, pt has not underwent any surgical procedures. However, within the previous 12 months, pt was seen at [facility] on 09/25/17 for mild intermittent asthmatic bronchitis with exacerbation, treated by Dr. Alcide Clever. No f/u occurred following this encounter.  In addition, pt was also seen on 03/26/17 for UTI with hematuria, treated by Dr. Judd Gaudier. Pt was seen in f/u by Dr. Carlynn Purl on 04/04/17. Planning included:  Assessment & Plan   1. Acute bilateral low back pain without sciatica   - POCT urinalysis dipstick   2. Urinary hesitancy   - POCT urinalysis dipstick - CULTURE, URINE COMPREHENSIVE - ciprofloxacin (CIPRO) 250 MG tablet; Take 1 tablet (250 mg total) by mouth 2 (two) times daily.  Dispense: 6 tablet; Refill: 0 - phenazopyridine (PYRIDIUM) 100 MG tablet; Take 1 tablet (100 mg total) by mouth 3 (three) times daily as needed for pain.  Dispense: 10 tablet; Refill: 0  Past Medical History:  Diagnosis Date  . Anxiety   . Asthma   . Diabetes mellitus without complication (HCC)   . GERD (gastroesophageal reflux disease)   . Hyperlipidemia   .  Insomnia    Past Surgical History:  Procedure Laterality Date  . ABDOMINAL HYSTERECTOMY    . BREAST SURGERY     breast reduction  . REDUCTION MAMMAPLASTY Bilateral 1995  . TUBAL LIGATION     Family History  Problem Relation Age of Onset  . Hyperlipidemia Mother   . Hypertension Mother   . Stroke Mother   . Dementia Father   . Cancer Sister        breast  . Breast cancer Sister 37  . Asthma Brother   . Depression Brother        bipolar   . Heart disease Brother   . Heart disease Brother    Social History   Socioeconomic History  . Marital status: Married    Spouse name: [name]  . Number of children: 2  . Years of education: Not on file  . Highest education level: 12th grade  Occupational History  . Occupation: Retired  Engineer, production  . Financial resource strain: Not hard at all  . Food insecurity:    Worry: Never true    Inability: Never true  . Transportation needs:    Medical: No    Non-medical: No  Tobacco Use  . Smoking status: Never Smoker  . Smokeless tobacco: Never Used  . Tobacco comment: smoking cessation materials not required  Substance and Sexual Activity  . Alcohol use: No    Alcohol/week: 0.0 oz  . Drug use: No  . Sexual activity: Not Currently    Partners: Male  Lifestyle  . Physical activity:    Days per week: 0 days    Minutes per session: 0 min  . Stress: Not at all  Relationships  . Social connections:    Talks on phone: Patient refused    Gets together: Patient refused    Attends religious service: Patient refused    Active member of club or organization: Patient refused    Attends meetings of clubs or organizations: Patient refused    Relationship status: Married  Other Topics Concern  . Not on file  Social History Narrative  . Not on file    Outpatient Encounter Medications as of 10/10/2017  Medication Sig  . albuterol (PROVENTIL HFA) 108 (90 BASE) MCG/ACT inhaler Inhale 2 puffs into the lungs as needed.  Marland Kitchen aspirin EC 81 MG tablet Take 1 tablet by mouth daily.  . carvedilol (COREG) 6.25 MG tablet Take 1 tablet (6.25 mg total) by mouth 2 (two) times daily.  Marland Kitchen losartan (COZAAR) 50 MG tablet Take 1 tablet (50 mg total) by mouth daily.  . rosuvastatin (CRESTOR) 40 MG tablet TAKE 1 TABLET (40 MG TOTAL) BY MOUTH DAILY.  Marland Kitchen zolpidem (AMBIEN) 10 MG tablet TAKE 1 TABLET BY MOUTH AT BEDTIME  . acyclovir ointment (ZOVIRAX) 5 % Apply 1 application topically 2 (two) times daily as needed.  Reported on 06/13/2015  . colchicine 0.6 MG tablet Take 1 tablet (0.6 mg total) by mouth daily. Take two at onset of gout and repeat in 1 hour, prn (Patient not taking: Reported on 10/10/2017)  . dicyclomine (BENTYL) 20 MG tablet Take 1 tablet (20 mg total) by mouth 2 (two) times daily as needed for spasms. (Patient not taking: Reported on 10/10/2017)  . omeprazole (PRILOSEC) 20 MG capsule Take 1 capsule by mouth daily as needed.  . valACYclovir (VALTREX) 500 MG tablet TAKE 1 TABLET (500 MG TOTAL) BY MOUTH DAILY. (Patient not taking: Reported on 10/10/2017)  . [DISCONTINUED] benzonatate (TESSALON) 200 MG  capsule Take 1 capsule (200 mg total) by mouth 3 (three) times daily as needed.  . [DISCONTINUED] doxycycline (VIBRA-TABS) 100 MG tablet Take 1 tablet (100 mg total) by mouth 2 (two) times daily.   No facility-administered encounter medications on file as of 10/10/2017.     Activities of Daily Living In your present state of health, do you have any difficulty performing the following activities: 10/10/2017 06/11/2017  Hearing? N N  Comment denies hearing aids -  Vision? N N  Comment wears eyeglasses -  Difficulty concentrating or making decisions? Y N  Comment short term memory loss -  Walking or climbing stairs? Y N  Comment knee pain -  Dressing or bathing? N N  Doing errands, shopping? N N  Preparing Food and eating ? N -  Comment full set upper and lower dentures -  Using the Toilet? N -  In the past six months, have you accidently leaked urine? N -  Do you have problems with loss of bowel control? N -  Managing your Medications? N -  Managing your Finances? N -  Housekeeping or managing your Housekeeping? N -  Some recent data might be hidden    Patient Care Team: Alba Cory, MD as PCP - General (Family Medicine)    Assessment:   This is a routine wellness examination for Dawn.  Exercise Activities and Dietary recommendations Current Exercise Habits: The patient does not  participate in regular exercise at present, Exercise limited by: None identified  Goals    . DIET - INCREASE WATER INTAKE     Recommend to drink at least 6-8 8oz glasses of water per day.       Fall Risk Fall Risk  10/10/2017 06/11/2017 09/27/2016 09/04/2016 06/24/2016  Falls in the past year? No No No No No  Risk for fall due to : Impaired vision;Medication side effect - - - -  Risk for fall due to: Comment wears eyeglasses; Ambien - - - -   Is the home free of loose throw rugs in walkways, pet beds, electrical cords, etc? Yes Adequate lighting to reduce risk of falls?  Yes In addition, does the patient have any of the following: Stairs in or around the home WITH handrails? Yes Use of a cane, walker or w/c? No Grab bars in the bathroom? Yes  Shower chair or a place to sit while bathing? Yes Use of an elevated toilet seat or a handicapped toilet? Yes  Timed Get Up and Go Performed: Yes. Pt ambulated 10 feet within 7 sec. Gait stead-fast and without the use of an assistive device. No intervention required at this time. Fall risk prevention has been discussed.  Community Resource Referral not required at this time.  Depression Screen PHQ 2/9 Scores 10/10/2017 09/27/2016 09/04/2016 06/24/2016  PHQ - 2 Score 0 0 0 0  PHQ- 9 Score 0 - - -     Cognitive Function     6CIT Screen 10/10/2017 06/24/2016  What Year? 0 points 0 points  What month? 0 points 0 points  What time? 0 points 0 points  Count back from 20 0 points 0 points  Months in reverse 0 points 0 points  Repeat phrase 0 points 2 points  Total Score 0 2    Immunization History  Administered Date(s) Administered  . Influenza, High Dose Seasonal PF 02/20/2015, 05/24/2016, 01/29/2017  . Pneumococcal Conjugate-13 01/10/2016  . Pneumococcal Polysaccharide-23 11/18/2014  . Td 01/29/2017  . Tdap 04/10/2007  . Tetanus  04/10/2007    Qualifies for Shingles Vaccine? Yes. Due for Zostavax or Shingrix vaccine. Education has been  provided regarding the importance of this vaccine. Pt has been advised to call her insurance company to determine her out of pocket expense. Advised she may also receive this vaccine at her local pharmacy or Health Dept. Verbalized acceptance and understanding.  Screening Tests Health Maintenance  Topic Date Due  . OPHTHALMOLOGY EXAM  08/02/2017  . HEMOGLOBIN A1C  12/09/2017  . INFLUENZA VACCINE  01/08/2018  . FOOT EXAM  01/29/2018  . MAMMOGRAM  09/24/2018  . COLONOSCOPY  06/19/2023  . TETANUS/TDAP  01/30/2027  . DEXA SCAN  Completed  . Hepatitis C Screening  Completed  . PNA vac Low Risk Adult  Completed    Cancer Screenings: Lung: Low Dose CT Chest recommended if Age 60-80 years, 30 pack-year currently smoking OR have quit w/in 15years. Patient does not qualify. Breast:  Up to date on Mammogram? Yes. Completed 09/23/17. Repeat every year   Up to date of Bone Density/Dexa? Yes. Completed 03/10/17. Osteoporotic screenings no longer required Colorectal: Completed 06/18/13. Repeat every 10 years  Additional Screenings: Hepatitis C Screening: Completed 06/26/12    Plan:  I have personally reviewed and addressed the Medicare Annual Wellness questionnaire and have noted the following in the patient's chart:  A. Medical and social history B. Use of alcohol, tobacco or illicit drugs  C. Current medications and supplements D. Functional ability and status E.  Nutritional status F.  Physical activity G. Advance directives H. List of other physicians I.  Hospitalizations, surgeries, and ER visits in previous 12 months J.  Vitals K. Screenings such as hearing and vision if needed, cognitive and depression L. Referrals and appointments  In addition, I have reviewed and discussed with patient certain preventive protocols, quality metrics, and best practice recommendations. A written personalized care plan for preventive services as well as general preventive health recommendations were  provided to patient.  See attached scanned questionnaire for additional information.   Signed,  Deon Pilling, LPN Nurse Health Advisor  I have reviewed this encounter including the documentation in this note and/or discussed this patient with the provider, Deon Pilling, LPN. I am certifying that I agree with the content of this note as supervising physician.  Alba Cory, MD [facility] 10/10/2017, 1:15 PM

## 2017-10-10 NOTE — Patient Instructions (Signed)
Dawn Werner , Thank you for taking time to come for your Medicare Wellness Visit. I appreciate your ongoing commitment to your health goals. Please review the following plan we discussed and let me know if I can assist you in the future.   Screening recommendations/referrals: Colorectal Screening: Completed 06/18/13. Repeat every 10 years Mammogram: Completed 09/23/17. Repeat every year Bone Density: Completed 03/10/17. Osteoporotic screenings no longer required Lung Cancer Screening: You do not qualify for this screening Hepatitis C Screening: Completed 06/26/12  Vision and Dental Exams: Recommended annual ophthalmology exams for early detection of glaucoma and other disorders of the eye Recommended annual dental exams for proper oral hygiene  Diabetic Exams: Recommended annual diabetic eye exams for early detection of retinopathy Recommended annual diabetic foot exams for early detection of peripheral neuropathy.  Diabetic Eye Exam: Completed 08/02/16 Diabetic Foot Exam: Completed 01/29/17  Vaccinations: Influenza vaccine: Up to date Pneumococcal vaccine: Completed series Tdap vaccine: Up to date Shingles vaccine: Please call your insurance company to determine your out of pocket expense for the Shingrix vaccine. You may also receive this vaccine at your local pharmacy or Health Dept.  Advanced directives: Advance directive discussed with you today. I have provided a copy for you to complete at home and have notarized. Once this is complete please bring a copy in to our office so we can scan it into your chart.  Conditions/risks identified: Recommend to drink at least 6-8 8oz glasses of water per day.  Next appointment: Please schedule your Annual Wellness Visit with your Nurse Health Advisor in one year.  Preventive Care 10 Years and Older, Female Preventive care refers to lifestyle choices and visits with your health care provider that can promote health and wellness. What does  preventive care include?  A yearly physical exam. This is also called an annual well check.  Dental exams once or twice a year.  Routine eye exams. Ask your health care provider how often you should have your eyes checked.  Personal lifestyle choices, including:  Daily care of your teeth and gums.  Regular physical activity.  Eating a healthy diet.  Avoiding tobacco and drug use.  Limiting alcohol use.  Practicing safe sex.  Taking low-dose aspirin every day.  Taking vitamin and mineral supplements as recommended by your health care provider. What happens during an annual well check? The services and screenings done by your health care provider during your annual well check will depend on your age, overall health, lifestyle risk factors, and family history of disease. Counseling  Your health care provider may ask you questions about your:  Alcohol use.  Tobacco use.  Drug use.  Emotional well-being.  Home and relationship well-being.  Sexual activity.  Eating habits.  History of falls.  Memory and ability to understand (cognition).  Work and work Astronomer.  Reproductive health. Screening  You may have the following tests or measurements:  Height, weight, and BMI.  Blood pressure.  Lipid and cholesterol levels. These may be checked every 5 years, or more frequently if you are over 19 years old.  Skin check.  Lung cancer screening. You may have this screening every year starting at age 55 if you have a 30-pack-year history of smoking and currently smoke or have quit within the past 15 years.  Fecal occult blood test (FOBT) of the stool. You may have this test every year starting at age 71.  Flexible sigmoidoscopy or colonoscopy. You may have a sigmoidoscopy every 5 years or a colonoscopy  every 10 years starting at age 41.  Hepatitis C blood test.  Hepatitis B blood test.  Sexually transmitted disease (STD) testing.  Diabetes screening. This  is done by checking your blood sugar (glucose) after you have not eaten for a while (fasting). You may have this done every 1-3 years.  Bone density scan. This is done to screen for osteoporosis. You may have this done starting at age 34.  Mammogram. This may be done every 1-2 years. Talk to your health care provider about how often you should have regular mammograms. Talk with your health care provider about your test results, treatment options, and if necessary, the need for more tests. Vaccines  Your health care provider may recommend certain vaccines, such as:  Influenza vaccine. This is recommended every year.  Tetanus, diphtheria, and acellular pertussis (Tdap, Td) vaccine. You may need a Td booster every 10 years.  Zoster vaccine. You may need this after age 40.  Pneumococcal 13-valent conjugate (PCV13) vaccine. One dose is recommended after age 45.  Pneumococcal polysaccharide (PPSV23) vaccine. One dose is recommended after age 73. Talk to your health care provider about which screenings and vaccines you need and how often you need them. This information is not intended to replace advice given to you by your health care provider. Make sure you discuss any questions you have with your health care provider. Document Released: 06/23/2015 Document Revised: 02/14/2016 Document Reviewed: 03/28/2015 Elsevier Interactive Patient Education  2017 [employer] Prevention in the Home Falls can cause injuries. They can happen to people of all ages. There are many things you can do to make your home safe and to help prevent falls. What can I do on the outside of my home?  Regularly fix the edges of walkways and driveways and fix any cracks.  Remove anything that might make you trip as you walk through a door, such as a raised step or threshold.  Trim any bushes or trees on the path to your home.  Use bright outdoor lighting.  Clear any walking paths of anything that might make  someone trip, such as rocks or tools.  Regularly check to see if handrails are loose or broken. Make sure that both sides of any steps have handrails.  Any raised decks and porches should have guardrails on the edges.  Have any leaves, snow, or ice cleared regularly.  Use sand or salt on walking paths during winter.  Clean up any spills in your garage right away. This includes oil or grease spills. What can I do in the bathroom?  Use night lights.  Install grab bars by the toilet and in the tub and shower. Do not use towel bars as grab bars.  Use non-skid mats or decals in the tub or shower.  If you need to sit down in the shower, use a plastic, non-slip stool.  Keep the floor dry. Clean up any water that spills on the floor as soon as it happens.  Remove soap buildup in the tub or shower regularly.  Attach bath mats securely with double-sided non-slip rug tape.  Do not have throw rugs and other things on the floor that can make you trip. What can I do in the bedroom?  Use night lights.  Make sure that you have a light by your bed that is easy to reach.  Do not use any sheets or blankets that are too big for your bed. They should not hang down onto the floor.  Have a firm chair that has side arms. You can use this for support while you get dressed.  Do not have throw rugs and other things on the floor that can make you trip. What can I do in the kitchen?  Clean up any spills right away.  Avoid walking on wet floors.  Keep items that you use a lot in easy-to-reach places.  If you need to reach something above you, use a strong step stool that has a grab bar.  Keep electrical cords out of the way.  Do not use floor polish or wax that makes floors slippery. If you must use wax, use non-skid floor wax.  Do not have throw rugs and other things on the floor that can make you trip. What can I do with my stairs?  Do not leave any items on the stairs.  Make sure that  there are handrails on both sides of the stairs and use them. Fix handrails that are broken or loose. Make sure that handrails are as long as the stairways.  Check any carpeting to make sure that it is firmly attached to the stairs. Fix any carpet that is loose or worn.  Avoid having throw rugs at the top or bottom of the stairs. If you do have throw rugs, attach them to the floor with carpet tape.  Make sure that you have a light switch at the top of the stairs and the bottom of the stairs. If you do not have them, ask someone to add them for you. What else can I do to help prevent falls?  Wear shoes that:  Do not have high heels.  Have rubber bottoms.  Are comfortable and fit you well.  Are closed at the toe. Do not wear sandals.  If you use a stepladder:  Make sure that it is fully opened. Do not climb a closed stepladder.  Make sure that both sides of the stepladder are locked into place.  Ask someone to hold it for you, if possible.  Clearly mark and make sure that you can see:  Any grab bars or handrails.  First and last steps.  Where the edge of each step is.  Use tools that help you move around (mobility aids) if they are needed. These include:  Canes.  Walkers.  Scooters.  Crutches.  Turn on the lights when you go into a dark area. Replace any light bulbs as soon as they burn out.  Set up your furniture so you have a clear path. Avoid moving your furniture around.  If any of your floors are uneven, fix them.  If there are any pets around you, be aware of where they are.  Review your medicines with your doctor. Some medicines can make you feel dizzy. This can increase your chance of falling. Ask your doctor what other things that you can do to help prevent falls. This information is not intended to replace advice given to you by your health care provider. Make sure you discuss any questions you have with your health care provider. Document Released:  03/23/2009 Document Revised: 11/02/2015 Document Reviewed: 07/01/2014 Elsevier Interactive Patient Education  2017 [employer].

## 2017-10-10 NOTE — Progress Notes (Signed)
Name: Dawn Werner   MRN: [id number]    DOB: 06/19/1950   Date:10/10/2017       Progress Note  Subjective  Chief Complaint  Chief Complaint  Patient presents with  . Follow-up    HPI  DMII: she has not been taking medication, metformin caused diarrhea, we gave her Victoza because of spiked glucose back in January 2018, she took for about one month, changed her diet and stopped medication because of cost. She has been exercising daily,currently gardening for hours a day, 5 days a week. Her hgbA1C went down from 7.8% to 7.3%, up to 7.4%,  7.3% last visit 6.9% we will check at the lab today.She does not want to take any medications at this time, urine micro negativelast time and is time for recheck. She  has history of nephropathy and is on ARB, no neuropathy, eye exam is up to date. FSBS at home has been better, gets down to 80-85  usually not higher than 150  Hyperlipidemia: she is back on Crestor  and states no longer has muscle pain or cramps. No chest pain, LDL has improved HDL is above 40 , recheck labs  Insomnia: she was trying to wean self off of Ambien, she has 10 mg tablets, and currently taking abite off her pills, but states Ambien 5 mg is more expensive and not enough, she thinks she is taking about 7 mg , she would like a 90 days supply, she does keep follow ups every 4 months  HTN: No chest pain, palpitation or SOB,taking medication as prescribed. She states bp at home has been well controlled.   CAD: no episodes, s/p history of MI in 2009, taking aspirin daily, beta-blocker and statin therapy. No chest pain, no decrease in exercise tolerance, she has not joined a gym, but loves working on her yard  Asthma Mild Intermittent:recent flare, went to Urgent care but still has a cough, we will add Breo temporarily . No wheezing or SOB    Patient Active Problem List   Diagnosis Date Noted  . 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 Derangement of  posterior horn of lateral meniscus of right knee 01/10/2016  . 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 Primary osteoarthritis of knee 01/24/2015  . Asthma, mild intermittent 11/18/2014  . Arteriosclerosis of coronary artery 11/18/2014  . Insomnia, persistent 11/18/2014  . Dyslipidemia 11/18/2014  . Acid reflux 11/18/2014  . Diabetes type 2, controlled (HCC) 11/18/2014  . Genital herpes in women 11/18/2014  . Hypertension 11/18/2014  . Asymptomatic postmenopausal status 11/18/2014  . History of acute myocardial infarction 11/18/2014    Past Surgical History:  Procedure Laterality Date  . ABDOMINAL HYSTERECTOMY    . BREAST SURGERY     breast reduction  . REDUCTION MAMMAPLASTY Bilateral 1995  . TUBAL LIGATION      Family History  Problem Relation Age of Onset  . Hyperlipidemia Mother   . Hypertension Mother   . Stroke Mother   . Dementia Father   . Cancer Sister        breast  . Breast cancer Sister 42  . Asthma Brother   . Depression Brother        bipolar  . Heart disease Brother   . Heart disease Brother     Social History   Socioeconomic History  . Marital status: Married    Spouse name: [name]  . Number of children: 2  . Years of education: Not on file  . Highest education level:  12th grade  Occupational History  . 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 Financial resource strain: Not hard at all  . Food insecurity:    Worry: Never true    Inability: Never true  . Transportation needs:    Medical: No    Non-medical: No  Tobacco Use  . Smoking status: Never Smoker  . Smokeless tobacco: Never Used  Substance and Sexual Activity  . Alcohol use: No    Alcohol/week: 0.0 oz  . Drug use: No  . Sexual activity: Yes    Partners: Male  Lifestyle  . Physical activity:    Days per week: 5 days    Minutes per session: 150+ min  . Stress: Not at all  Relationships  . Social connections:    Talks on phone: More than three times a week    Gets together:  More than three times a week    Attends religious service: More than 4 times per year    Active member of club or organization: Yes    Attends meetings of clubs or organizations: More than 4 times per year    Relationship status: Married  . Intimate partner violence:    Fear of current or ex partner: No    Emotionally abused: No    Physically abused: No    Forced sexual activity: No  Other Topics Concern  . Not on file  Social History Narrative  . Not on file     Current Outpatient Medications:  .  acyclovir ointment (ZOVIRAX) 5 %, Apply 1 application topically 2 (two) times daily as needed. Reported on 06/13/2015, Disp: , Rfl:  .  albuterol (PROVENTIL HFA) 108 (90 BASE) MCG/ACT inhaler, Inhale 2 puffs into the lungs as needed., Disp: , Rfl:  .  aspirin EC 81 MG tablet, Take 1 tablet by mouth daily., Disp: , Rfl:  .  carvedilol (COREG) 6.25 MG tablet, Take 1 tablet (6.25 mg total) by mouth 2 (two) times daily., Disp: 180 tablet, Rfl: 1 .  colchicine 0.6 MG tablet, Take 1 tablet (0.6 mg total) by mouth daily. Take two at onset of gout and repeat in 1 hour, prn (Patient not taking: Reported on 10/10/2017), Disp: 30 tablet, Rfl: 0 .  fluticasone furoate-vilanterol (BREO ELLIPTA) 100-25 MCG/INH AEPB, Inhale 1 puff into the lungs daily., Disp: 60 each, Rfl: 0 .  losartan (COZAAR) 50 MG tablet, Take 1 tablet (50 mg total) by mouth daily., Disp: 90 tablet, Rfl: 1 .  omeprazole (PRILOSEC) 20 MG capsule, Take 1 capsule by mouth daily as needed., Disp: , Rfl:  .  rosuvastatin (CRESTOR) 40 MG tablet, TAKE 1 TABLET (40 MG TOTAL) BY MOUTH DAILY., Disp: 90 tablet, Rfl: 1 .  valACYclovir (VALTREX) 500 MG tablet, TAKE 1 TABLET (500 MG TOTAL) BY MOUTH DAILY. (Patient not taking: Reported on 10/10/2017), Disp: 90 tablet, Rfl: 1 .  zolpidem (AMBIEN) 10 MG tablet, Take 1 tablet (10 mg total) by mouth at bedtime., Disp: 90 tablet, Rfl: 1  Allergies  Allergen Reactions  . Metformin And Related Diarrhea    And  nausea     ROS  Constitutional: Negative for fever or weight change.  Respiratory: positive  for cough but no shortness of breath.   Cardiovascular: Negative for chest pain or palpitations.  Gastrointestinal: Negative for abdominal pain, no bowel changes.  Musculoskeletal: Negative for gait problem or joint swelling.  Skin: Negative for rash.  Neurological: Negative for dizziness or headache.  No other specific complaints in a complete review of systems (except as listed in HPI above).  Objective  Vitals:   10/10/17 0901  BP: 120/62  Pulse: 66  Resp: 12  Temp: (!) 97.1 F (36.2 C)  TempSrc: Oral  SpO2: 96%  Weight: 166 lb (75.3 kg)  Height:  (1.626 m)    Body mass index is 28.49 kg/m.  Physical Exam  Constitutional: Patient appears well-developed and well-nourished.  No distress.  HEENT: head atraumatic, normocephalic, pupils equal and reactive to light, neck supple, throat within normal limits Cardiovascular: Normal rate, regular rhythm and normal heart sounds.  No murmur heard. No BLE edema. Pulmonary/Chest: Effort normal and breath sounds normal. No respiratory distress. Abdominal: Soft.  There is no tenderness. Psychiatric: Patient has a normal mood and affect. behavior is normal. Judgment and thought content normal.  PHQ2/9: Depression screen [facility] 2/9 10/10/2017 09/27/2016 09/04/2016 06/24/2016 01/10/2016  Decreased Interest 0 0 0 0 0  Down, Depressed, Hopeless 0 0 0 0 0  PHQ - 2 Score 0 0 0 0 0  Altered sleeping 0 - - - -  Tired, decreased energy 0 - - - -  Change in appetite 0 - - - -  Feeling bad or failure about yourself  0 - - - -  Trouble concentrating 0 - - - -  Moving slowly or fidgety/restless 0 - - - -  Suicidal thoughts 0 - - - -  PHQ-9 Score 0 - - - -  Difficult doing work/chores Not difficult at all - - - -     Fall Risk: Fall Risk  10/10/2017 06/11/2017 09/27/2016 09/04/2016 06/24/2016  Falls in the past year? No No No No No  Risk for fall due to  : Impaired vision;Medication side effect - - - -  Risk for fall due to: Comment wears eyeglasses; Ambien - - - -     Assessment & Plan  1. Type 2 diabetes mellitus with microalbuminuria, without long-term current use of insulin (HCC)  - losartan (COZAAR) 50 MG tablet; Take 1 tablet (50 mg total) by mouth daily.  Dispense: 90 tablet; Refill: 1 - COMPLETE METABOLIC PANEL WITH GFR - Hemoglobin A1c - Urine Microalbumin w/creat. ratio  2. Arteriosclerosis of coronary artery  - Lipid panel  3. Essential hypertension  - carvedilol (COREG) 6.25 MG tablet; Take 1 tablet (6.25 mg total) by mouth 2 (two) times daily.  Dispense: 180 tablet; Refill: 1 - losartan (COZAAR) 50 MG tablet; Take 1 tablet (50 mg total) by mouth daily.  Dispense: 90 tablet; Refill: 1  4. Mild intermittent asthma with exacerbation  - fluticasone furoate-vilanterol (BREO ELLIPTA) 100-25 MCG/INH AEPB; Inhale 1 puff into the lungs daily.  Dispense: 60 each; Refill: 0 Recent seen at Urgent care but still coughing, we will add Breo temporarily   5. Insomnia, persistent  - zolpidem (AMBIEN) 10 MG tablet; Take 1 tablet (10 mg total) by mouth at bedtime.  Dispense: 90 tablet; Refill: 1  6. Dyslipidemia  - rosuvastatin (CRESTOR) 40 MG tablet; TAKE 1 TABLET (40 MG TOTAL) BY MOUTH DAILY.  Dispense: 90 tablet; Refill: 1  7. History of acute myocardial infarction  - carvedilol (COREG) 6.25 MG tablet; Take 1 tablet (6.25 mg total) by mouth 2 (two) times daily.  Dispense: 180 tablet; Refill: 1 - rosuvastatin (CRESTOR) 40 MG tablet; TAKE 1 TABLET (40 MG TOTAL) BY MOUTH DAILY.  Dispense: 90 tablet; Refill: 1 - losartan (COZAAR) 50 MG tablet; Take 1 tablet (50  mg total) by mouth daily.  Dispense: 90 tablet; Refill: 1  8. Gastroesophageal reflux disease without esophagitis  Taking otc medication prn only

## 2017-10-11 LAB — HEMOGLOBIN A1C
Hgb A1c MFr Bld: 6.8 % of total Hgb — ABNORMAL HIGH (ref <5.7)
Mean Plasma Glucose: 148 (calc)
eAG (mmol/L): 8.2 (calc)

## 2017-10-11 LAB — MICROALBUMIN / CREATININE URINE RATIO
Creatinine, Urine: 113 mg/dL (ref 20–275)
Microalb Creat Ratio: 13 mcg/mg creat (ref <30)
Microalb, Ur: 1.5 mg/dL

## 2017-10-30 LAB — HM DIABETES EYE EXAM

## 2017-11-07 ENCOUNTER — Encounter: Payer: Self-pay | Admitting: Family Medicine

## 2018-02-10 ENCOUNTER — Ambulatory Visit (INDEPENDENT_AMBULATORY_CARE_PROVIDER_SITE_OTHER): Payer: PPO | Admitting: Family Medicine

## 2018-02-10 ENCOUNTER — Encounter: Payer: Self-pay | Admitting: Family Medicine

## 2018-02-10 VITALS — BP 118/64 | HR 76 | Temp 97.6°F | Resp 16 | Ht 64.0 in | Wt 166.1 lb

## 2018-02-10 DIAGNOSIS — I252 Old myocardial infarction: Secondary | ICD-10-CM | POA: Diagnosis not present

## 2018-02-10 DIAGNOSIS — K219 Gastro-esophageal reflux disease without esophagitis: Secondary | ICD-10-CM

## 2018-02-10 DIAGNOSIS — I10 Essential (primary) hypertension: Secondary | ICD-10-CM

## 2018-02-10 DIAGNOSIS — A6009 Herpesviral infection of other urogenital tract: Secondary | ICD-10-CM | POA: Diagnosis not present

## 2018-02-10 DIAGNOSIS — E1129 Type 2 diabetes mellitus with other diabetic kidney complication: Secondary | ICD-10-CM

## 2018-02-10 DIAGNOSIS — I2510 Atherosclerotic heart disease of native coronary artery without angina pectoris: Secondary | ICD-10-CM | POA: Diagnosis not present

## 2018-02-10 DIAGNOSIS — Z23 Encounter for immunization: Secondary | ICD-10-CM | POA: Diagnosis not present

## 2018-02-10 DIAGNOSIS — E785 Hyperlipidemia, unspecified: Secondary | ICD-10-CM | POA: Diagnosis not present

## 2018-02-10 DIAGNOSIS — J4520 Mild intermittent asthma, uncomplicated: Secondary | ICD-10-CM

## 2018-02-10 DIAGNOSIS — R809 Proteinuria, unspecified: Secondary | ICD-10-CM | POA: Diagnosis not present

## 2018-02-10 LAB — POCT GLYCOSYLATED HEMOGLOBIN (HGB A1C): HbA1c, POC (controlled diabetic range): 7.5 % — AB (ref 0.0–7.0)

## 2018-02-10 MED ORDER — CARVEDILOL 6.25 MG PO TABS: 6.2500 mg | ORAL_TABLET | Freq: Two times a day (BID) | ORAL | 1 refills | Status: DC

## 2018-02-10 MED ORDER — ACYCLOVIR 5 % EX OINT: 1.0000 "application " | TOPICAL_OINTMENT | Freq: Two times a day (BID) | CUTANEOUS | 0 refills | Status: DC | PRN

## 2018-02-10 MED ORDER — METFORMIN HCL ER 500 MG PO TB24: 500.0000 mg | ORAL_TABLET | Freq: Every day | ORAL | 1 refills | Status: DC

## 2018-02-10 MED ORDER — LOSARTAN POTASSIUM 50 MG PO TABS: 50.0000 mg | ORAL_TABLET | Freq: Every day | ORAL | 1 refills | Status: DC

## 2018-02-10 MED ORDER — VALACYCLOVIR HCL 500 MG PO TABS: 500.0000 mg | ORAL_TABLET | Freq: Every day | ORAL | 1 refills | Status: DC

## 2018-02-10 MED ORDER — ROSUVASTATIN CALCIUM 40 MG PO TABS: ORAL_TABLET | ORAL | 1 refills | Status: DC

## 2018-02-10 MED ORDER — EZETIMIBE 10 MG PO TABS: 10.0000 mg | ORAL_TABLET | Freq: Every day | ORAL | 1 refills | Status: DC

## 2018-02-10 NOTE — Progress Notes (Signed)
Name: Dawn Werner   MRN: [id number]    DOB: Aug 28, 1950   Date:02/10/2018       Progress Note  Subjective  Chief Complaint  Chief Complaint  Patient presents with  . Follow-up    4 mth f/u  . Hyperlipidemia  . Insomnia    Unchanged  . Diabetes    Checks once or twice monthly Yesterday morning-140  . Hypertension    Denies any symptoms  . Coronary Artery Disease  . Asthma    Controoled    HPI  DMII: she has not been taking medication, metformin caused diarrhea, we gave her Victoza because of spiked glucose back in January 2018, she took for about one month, changed her diet and stopped medication because of cost. She has been exercising daily,currently gardening for hours a day, 5 days a week. Her hgbA1C went down from 7.8% to 7.3%, up to 7.4%, 7.3% ,6.9%, 6.8% today is up to 7.5%   She  has history of nephropathy and is on ARB, no neuropathy, eye exam is up to date. FSBS 120's-140's   Hyperlipidemia: she is back on Crestor 40 mg and states no longer has muscle pain or cramps. No chest pain, LDL has improved HDL is above 40. LDL was above goal at 108 . She does not want injectables, but she is wiling to try Zetia   Insomnia: she was trying to wean self off of Ambien, she has 10 mg tablets, and currently taking abite off her pills, but states Ambien 5 mg is more expensiveand not enough, she thinks she is taking about 7 mg, she would like a 90 days supply, she still bites the pill , she will call back when she needs a refill.   HTN: No chest pain, palpitation or SOB ,taking medication as prescribed. She states bp at home is around 120's/70's. Denies orthostatic changes or dizziness.   CAD: no episodes, s/p history of MI in 2009, taking aspirin daily, beta-blocker and statin therapy. No chest pain, no decrease in exercise tolerance, she is not currently going to the gym but has been gardening almost daily   Asthma Mild Intermittent:: she states only coughing at  most once a month and is not using medicaiton   Patient Active Problem List   Diagnosis Date Noted  . 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 Derangement of posterior horn of lateral meniscus of right knee 01/10/2016  . 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 Primary osteoarthritis of knee 01/24/2015  . Asthma, mild intermittent 11/18/2014  . Arteriosclerosis of coronary artery 11/18/2014  . Insomnia, persistent 11/18/2014  . Dyslipidemia 11/18/2014  . Acid reflux 11/18/2014  . Diabetes type 2, controlled (HCC) 11/18/2014  . Genital herpes in women 11/18/2014  . Hypertension 11/18/2014  . Asymptomatic postmenopausal status 11/18/2014  . History of acute myocardial infarction 11/18/2014    Past Surgical History:  Procedure Laterality Date  . ABDOMINAL HYSTERECTOMY    . BREAST SURGERY     breast reduction  . REDUCTION MAMMAPLASTY Bilateral 1995  . TUBAL LIGATION      Family History  Problem Relation Age of Onset  . Hyperlipidemia Mother   . Hypertension Mother   . Stroke Mother   . Dementia Father   . Cancer Sister        breast  . Breast cancer Sister 21  . Asthma Brother   . Depression Brother        bipolar  . Heart disease Brother   . Heart disease  Brother     Social History   Socioeconomic History  . Marital status: Married    Spouse name: [name]  . Number of children: 2  . Years of education: Not on file  . Highest education level: 12th grade  Occupational History  . 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 Financial resource strain: Not hard at all  . Food insecurity:    Worry: Never true    Inability: Never true  . Transportation needs:    Medical: No    Non-medical: No  Tobacco Use  . Smoking status: Never Smoker  . Smokeless tobacco: Never Used  Substance and Sexual Activity  . Alcohol use: No    Alcohol/week: 0.0 standard drinks  . Drug use: No  . Sexual activity: Yes    Partners: Male  Lifestyle  . Physical activity:    Days per  week: 5 days    Minutes per session: 150+ min  . Stress: Not at all  Relationships  . Social connections:    Talks on phone: More than three times a week    Gets together: More than three times a week    Attends religious service: More than 4 times per year    Active member of club or organization: Yes    Attends meetings of clubs or organizations: More than 4 times per year    Relationship status: Married  . Intimate partner violence:    Fear of current or ex partner: No    Emotionally abused: No    Physically abused: No    Forced sexual activity: No  Other Topics Concern  . Not on file  Social History Narrative  . Not on file     Current Outpatient Medications:  .  acyclovir ointment (ZOVIRAX) 5 %, Apply 1 application topically 2 (two) times daily as needed. Reported on 06/13/2015, Disp: 30 g, Rfl: 0 .  albuterol (PROVENTIL HFA) 108 (90 BASE) MCG/ACT inhaler, Inhale 2 puffs into the lungs as needed., Disp: , Rfl:  .  aspirin EC 81 MG tablet, Take 1 tablet by mouth daily., Disp: , Rfl:  .  carvedilol (COREG) 6.25 MG tablet, Take 1 tablet (6.25 mg total) by mouth 2 (two) times daily., Disp: 180 tablet, Rfl: 1 .  fluticasone furoate-vilanterol (BREO ELLIPTA) 100-25 MCG/INH AEPB, Inhale 1 puff into the lungs daily., Disp: 60 each, Rfl: 0 .  losartan (COZAAR) 50 MG tablet, Take 1 tablet (50 mg total) by mouth daily., Disp: 90 tablet, Rfl: 1 .  omeprazole (PRILOSEC) 20 MG capsule, Take 1 capsule by mouth daily as needed., Disp: , Rfl:  .  rosuvastatin (CRESTOR) 40 MG tablet, TAKE 1 TABLET (40 MG TOTAL) BY MOUTH DAILY., Disp: 90 tablet, Rfl: 1 .  valACYclovir (VALTREX) 500 MG tablet, Take 1 tablet (500 mg total) by mouth daily., Disp: 90 tablet, Rfl: 1 .  zolpidem (AMBIEN) 10 MG tablet, Take 1 tablet (10 mg total) by mouth at bedtime., Disp: 90 tablet, Rfl: 1 .  colchicine 0.6 MG tablet, Take 1 tablet (0.6 mg total) by mouth daily. Take two at onset of gout and repeat in 1 hour, prn  (Patient not taking: Reported on 02/10/2018), Disp: 30 tablet, Rfl: 0 .  ezetimibe (ZETIA) 10 MG tablet, Take 1 tablet (10 mg total) by mouth daily., Disp: 90 tablet, Rfl: 1 .  metFORMIN (GLUCOPHAGE-XR) 500 MG 24 hr tablet, Take 1 tablet (500 mg total) by mouth daily with breakfast., Disp:  90 tablet, Rfl: 1  Allergies  Allergen Reactions  . Metformin And Related Diarrhea    And nausea     ROS  Constitutional: Negative for fever or weight change.  Respiratory: Negative for cough and shortness of breath.   Cardiovascular: Negative for chest pain or palpitations.  Gastrointestinal: Negative for abdominal pain, no bowel changes.  Musculoskeletal: Negative for gait problem or joint swelling.  Skin: Negative for rash.  Neurological: Negative for dizziness or headache.  No other specific complaints in a complete review of systems (except as listed in HPI above).  Objective  Vitals:   02/10/18 0827  BP: 118/64  Pulse: 76  Resp: 16  Temp: 97.6 F (36.4 C)  TempSrc: Oral  SpO2: 99%  Weight: 166 lb 1.6 oz (75.3 kg)  Height: 5\' 4"  (1.626 m)    Body mass index is 28.51 kg/m.  Physical Exam  Constitutional: Patient appears well-developed and well-nourished. Obese  No distress.  HEENT: head atraumatic, normocephalic, pupils equal and reactive to light, neck supple, throat within normal limits Cardiovascular: Normal rate, regular rhythm and normal heart sounds.  No murmur heard. No BLE edema. Pulmonary/Chest: Effort normal and breath sounds normal. No respiratory distress. Abdominal: Soft.  There is no tenderness. Psychiatric: Patient has a normal mood and affect. behavior is normal. Judgment and thought content normal.  Recent Results (from the past 2160 hour(s))  POCT HgB A1C     Status: Abnormal   Collection Time: 02/10/18  8:46 AM  Result Value Ref Range   Hemoglobin A1C     HbA1c POC (<> result, manual entry)     HbA1c, POC (prediabetic range)     HbA1c, POC (controlled  diabetic range) 7.5 (A) 0.0 - 7.0 %    Diabetic Foot Exam: Diabetic Foot Exam - Simple   Simple Foot Form Diabetic Foot exam was performed with the following findings:  Yes 02/10/2018  8:53 AM  Visual Inspection No deformities, no ulcerations, no other skin breakdown bilaterally:  Yes Sensation Testing Intact to touch and monofilament testing bilaterally:  Yes Pulse Check Posterior Tibialis and Dorsalis pulse intact bilaterally:  Yes Comments      PHQ2/9: Depression screen [facility] 2/9 02/10/2018 10/10/2017 09/27/2016 09/04/2016 06/24/2016  Decreased Interest 0 0 0 0 0  Down, Depressed, Hopeless 0 0 0 0 0  PHQ - 2 Score 0 0 0 0 0  Altered sleeping - 0 - - -  Tired, decreased energy - 0 - - -  Change in appetite - 0 - - -  Feeling bad or failure about yourself  - 0 - - -  Trouble concentrating - 0 - - -  Moving slowly or fidgety/restless - 0 - - -  Suicidal thoughts - 0 - - -  PHQ-9 Score - 0 - - -  Difficult doing work/chores - Not difficult at all - - -     Fall Risk: Fall Risk  10/10/2017 06/11/2017 09/27/2016 09/04/2016 06/24/2016  Falls in the past year? No No No No No  Risk for fall due to : Impaired vision;Medication side effect - - - -  Risk for fall due to: Comment wears eyeglasses; Ambien - - - -     Functional Status Survey: Is the patient deaf or have difficulty hearing?: No Does the patient have difficulty seeing, even when wearing glasses/contacts?: Yes Does the patient have difficulty concentrating, remembering, or making decisions?: No Does the patient have difficulty walking or climbing stairs?: No Does the patient have  difficulty dressing or bathing?: No Does the patient have difficulty doing errands alone such as visiting a doctor's office or shopping?: No    Assessment & Plan  1. Type 2 diabetes mellitus with microalbuminuria, without long-term current use of insulin (HCC)  She is willing to try metformin ER, she will take it at night, discussed other options  but she wants safe generic options. She will call back if unable to tolerate medication  - POCT HgB A1C - losartan (COZAAR) 50 MG tablet; Take 1 tablet (50 mg total) by mouth daily.  Dispense: 90 tablet; Refill: 1 - metFORMIN (GLUCOPHAGE-XR) 500 MG 24 hr tablet; Take 1 tablet (500 mg total) by mouth daily with breakfast.  Dispense: 90 tablet; Refill: 1  2. Arteriosclerosis of coronary artery  On statin therapy, but LDL still above goal, we will add Zetia   3. Essential hypertension  - carvedilol (COREG) 6.25 MG tablet; Take 1 tablet (6.25 mg total) by mouth 2 (two) times daily.  Dispense: 180 tablet; Refill: 1 - losartan (COZAAR) 50 MG tablet; Take 1 tablet (50 mg total) by mouth daily.  Dispense: 90 tablet; Refill: 1  4. Dyslipidemia  - rosuvastatin (CRESTOR) 40 MG tablet; TAKE 1 TABLET (40 MG TOTAL) BY MOUTH DAILY.  Dispense: 90 tablet; Refill: 1  5. Gastroesophageal reflux disease without esophagitis  Under control with Omeprazole prn   6. History of acute myocardial infarction  - carvedilol (COREG) 6.25 MG tablet; Take 1 tablet (6.25 mg total) by mouth 2 (two) times daily.  Dispense: 180 tablet; Refill: 1 - losartan (COZAAR) 50 MG tablet; Take 1 tablet (50 mg total) by mouth daily.  Dispense: 90 tablet; Refill: 1 - rosuvastatin (CRESTOR) 40 MG tablet; TAKE 1 TABLET (40 MG TOTAL) BY MOUTH DAILY.  Dispense: 90 tablet; Refill: 1  7. Needs flu shot  - Flu vaccine HIGH DOSE PF  8. Genital herpes in women  - ezetimibe (ZETIA) 10 MG tablet; Take 1 tablet (10 mg total) by mouth daily.  Dispense: 90 tablet; Refill: 1 - acyclovir ointment (ZOVIRAX) 5 %; Apply 1 application topically 2 (two) times daily as needed. Reported on 06/13/2015  Dispense: 30 g; Refill: 0 - valACYclovir (VALTREX) 500 MG tablet; Take 1 tablet (500 mg total) by mouth daily.  Dispense: 90 tablet; Refill: 1  9. Mild intermittent asthma without complication  Only taking prn medication

## 2018-03-04 DIAGNOSIS — M1711 Unilateral primary osteoarthritis, right knee: Secondary | ICD-10-CM | POA: Diagnosis not present

## 2018-04-10 IMAGING — MG MM DIGITAL SCREENING BILAT W/ CAD
6 series · 6 of 6 positions shown · non-contrast
Comparison: Previous exam(s).

ACR Breast Density Category a: The breast tissue is almost entirely
fatty.

CLINICAL DATA: Screening.

EXAM:
DIGITAL SCREENING BILATERAL MAMMOGRAM WITH CAD

[R MLO]
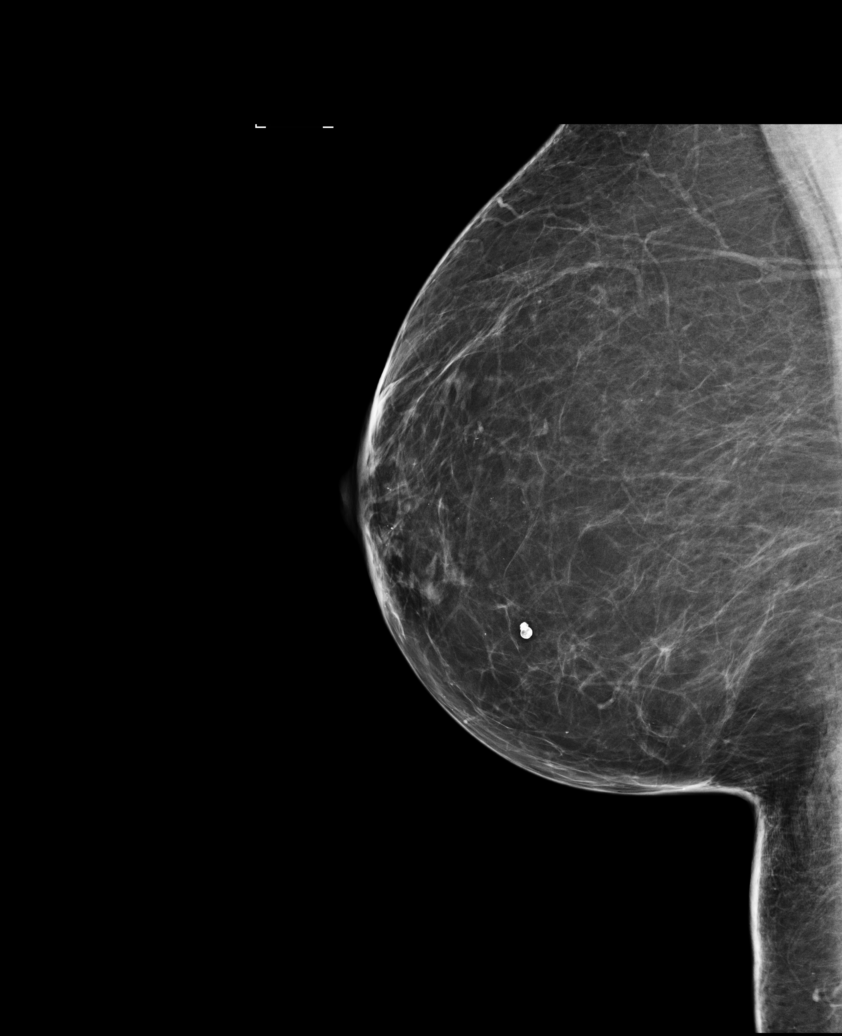

[R XCCM]
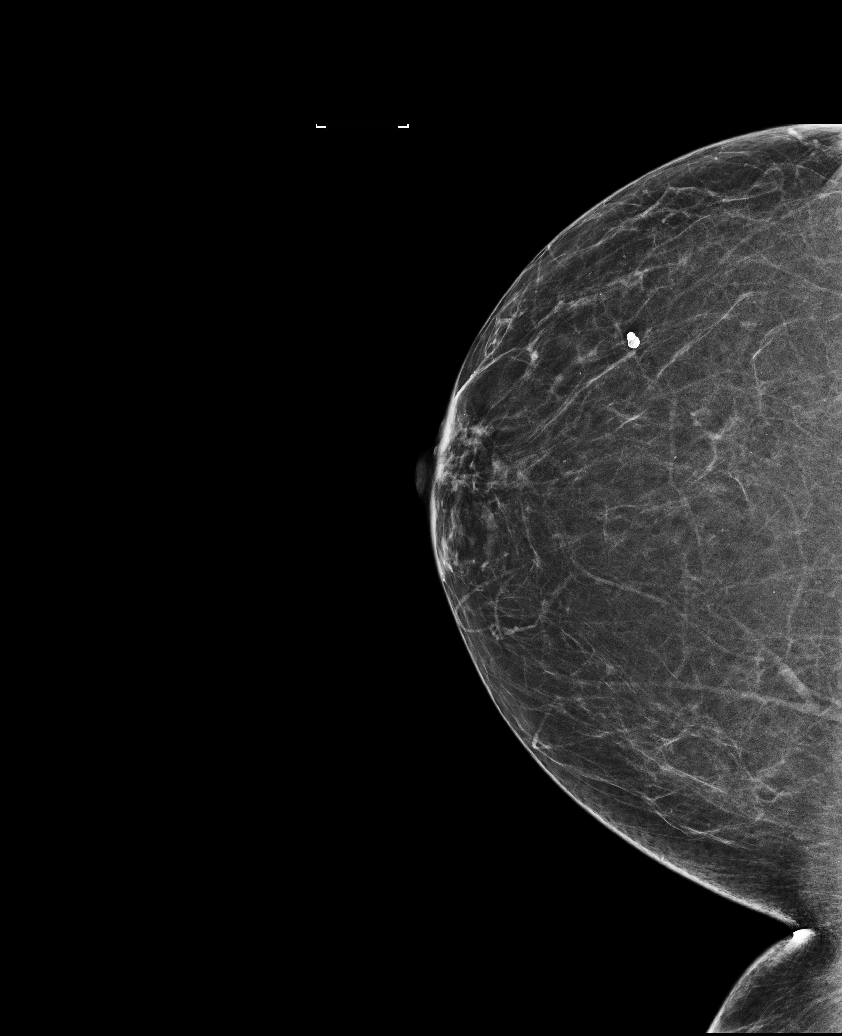

[R CC]
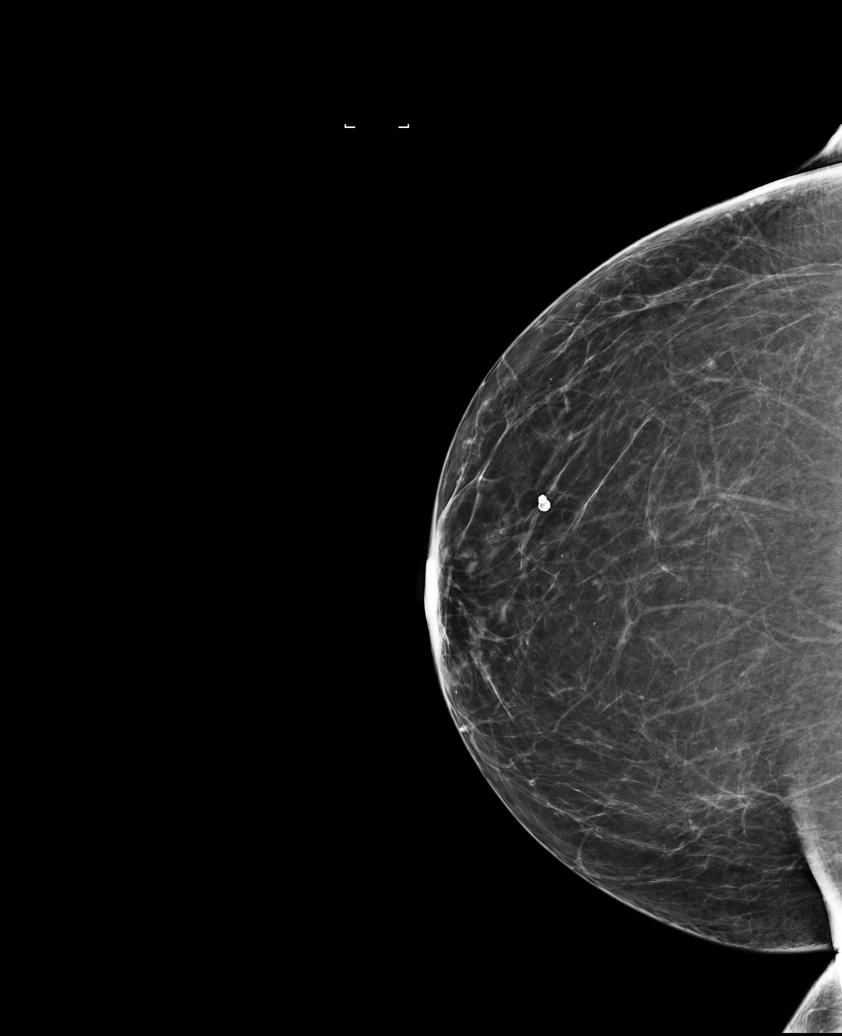

[L MLO (1 of 2)]
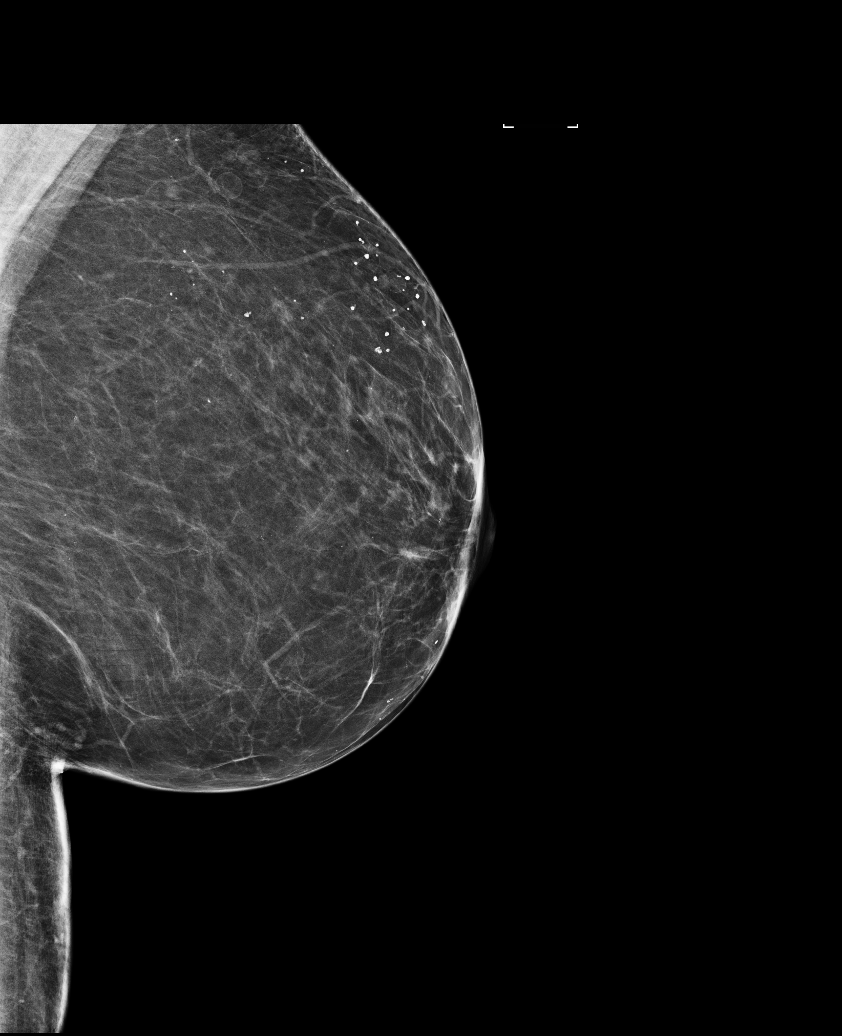

[L MLO (2 of 2)]
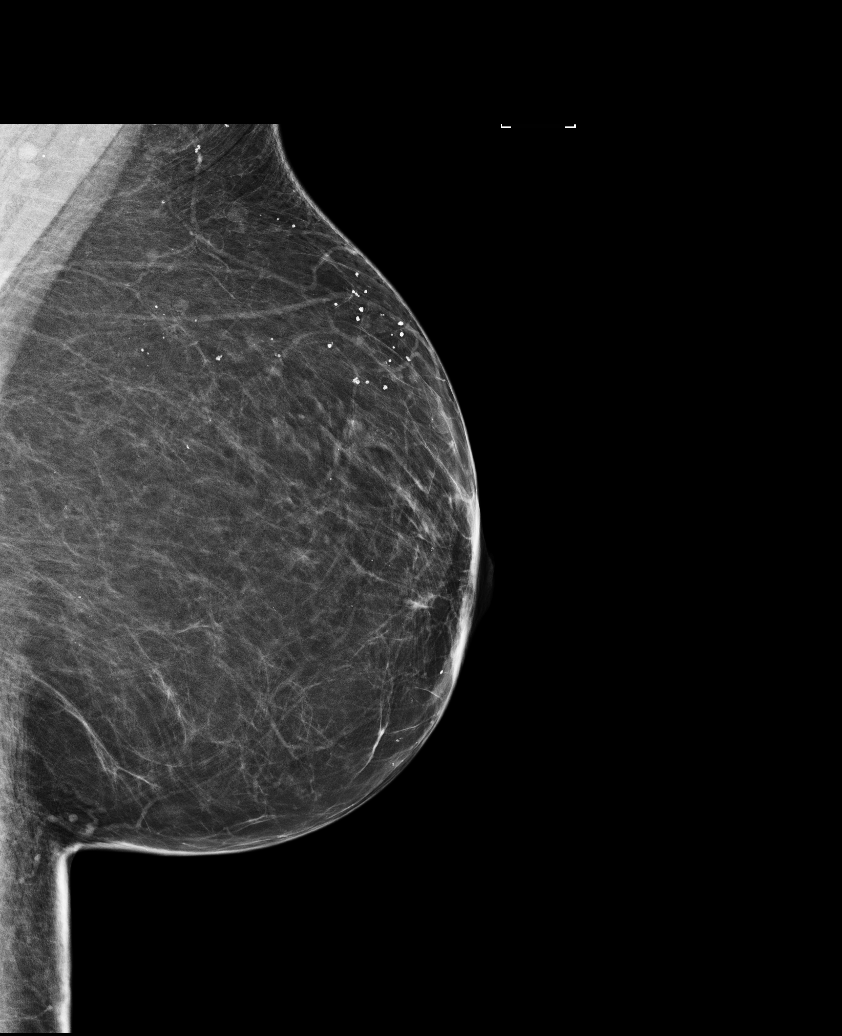

[L CC]
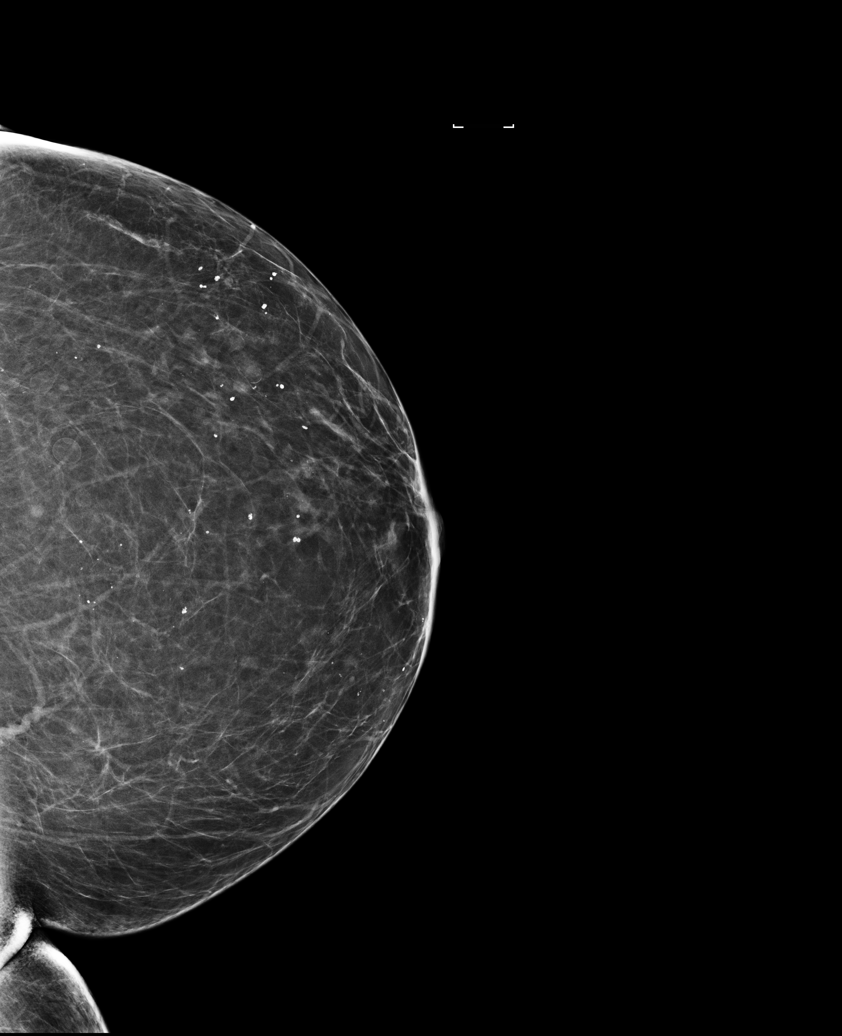

[6 of 6 positions shown; findings below may reference images not displayed]

FINDINGS: There are no findings suspicious for malignancy. Images were
processed with CAD.
IMPRESSION: No mammographic evidence of malignancy. A result letter of this
screening mammogram will be mailed directly to the patient.

RECOMMENDATION:
Screening mammogram in one year. (Code:[id number])

BI-RADS CATEGORY  1: Negative.

## 2018-04-13 ENCOUNTER — Other Ambulatory Visit: Payer: Self-pay | Admitting: Family Medicine

## 2018-04-13 DIAGNOSIS — G4700 Insomnia, unspecified: Secondary | ICD-10-CM

## 2018-06-16 ENCOUNTER — Encounter: Payer: Self-pay | Admitting: Family Medicine

## 2018-06-16 ENCOUNTER — Ambulatory Visit (INDEPENDENT_AMBULATORY_CARE_PROVIDER_SITE_OTHER): Payer: Medicare Other | Admitting: Family Medicine

## 2018-06-16 VITALS — BP 106/60 | HR 72 | Temp 98.1°F | Resp 16 | Ht 64.0 in | Wt 162.3 lb

## 2018-06-16 DIAGNOSIS — I10 Essential (primary) hypertension: Secondary | ICD-10-CM | POA: Diagnosis not present

## 2018-06-16 DIAGNOSIS — E1129 Type 2 diabetes mellitus with other diabetic kidney complication: Secondary | ICD-10-CM | POA: Diagnosis not present

## 2018-06-16 DIAGNOSIS — K219 Gastro-esophageal reflux disease without esophagitis: Secondary | ICD-10-CM

## 2018-06-16 DIAGNOSIS — I2510 Atherosclerotic heart disease of native coronary artery without angina pectoris: Secondary | ICD-10-CM

## 2018-06-16 DIAGNOSIS — F4321 Adjustment disorder with depressed mood: Secondary | ICD-10-CM

## 2018-06-16 DIAGNOSIS — J4521 Mild intermittent asthma with (acute) exacerbation: Secondary | ICD-10-CM

## 2018-06-16 DIAGNOSIS — I252 Old myocardial infarction: Secondary | ICD-10-CM

## 2018-06-16 DIAGNOSIS — R809 Proteinuria, unspecified: Secondary | ICD-10-CM

## 2018-06-16 DIAGNOSIS — A6009 Herpesviral infection of other urogenital tract: Secondary | ICD-10-CM

## 2018-06-16 DIAGNOSIS — E785 Hyperlipidemia, unspecified: Secondary | ICD-10-CM | POA: Diagnosis not present

## 2018-06-16 LAB — POCT GLYCOSYLATED HEMOGLOBIN (HGB A1C): HbA1c, POC (controlled diabetic range): 6.8 % (ref 0.0–7.0)

## 2018-06-16 MED ORDER — ROSUVASTATIN CALCIUM 40 MG PO TABS: ORAL_TABLET | ORAL | 1 refills | Status: DC

## 2018-06-16 MED ORDER — METFORMIN HCL ER 500 MG PO TB24: 500.0000 mg | ORAL_TABLET | Freq: Every day | ORAL | 1 refills | Status: DC

## 2018-06-16 MED ORDER — LOSARTAN POTASSIUM 50 MG PO TABS: 50.0000 mg | ORAL_TABLET | Freq: Every day | ORAL | 1 refills | Status: DC

## 2018-06-16 MED ORDER — CARVEDILOL 6.25 MG PO TABS: 6.2500 mg | ORAL_TABLET | Freq: Two times a day (BID) | ORAL | 1 refills | Status: DC

## 2018-06-16 MED ORDER — EZETIMIBE 10 MG PO TABS: 10.0000 mg | ORAL_TABLET | Freq: Every day | ORAL | 1 refills | Status: DC

## 2018-06-16 NOTE — Progress Notes (Signed)
Name: Dawn PresserVirginia A Gillott   MRN: [id number]    DOB: March 24, 1951   Date:06/16/2018       Progress Note  Subjective  Chief Complaint  Chief Complaint  Patient presents with  . Medication Refill    Needs a new Glucometer  . Diabetes    Checks weekly Average-118-  . Hypertension    Denies any symptoms  . Dyslipidemia  . Left Side Pain    Onset-couple of months, discomfort intermittently  . Verrucous Vulgaris    Left side of her face- would like Dr. Carlynn PurlSowles to look at   . Insomnia    Goes to bed around 11 p.m. and will wake up around 2 a.m. depends on the day stress on how she will sleep throughtout the night. Medication helps with her overall sleep.    HPI  DMII: she has not been taking medication, metformin caused diarrhea, but doing well on Metformin ER She has been exercising daily, still doing yard work , such as Pharmacist, communityracking nowHerhgbA1C went down from 7.8% to 7.3%, up to 7.4%, 7.3%,6.9%, 6.8%, 7.5% but down to 6.8% today  Shehas history of nephropathy and is on ARB, no neuropathy, eye exam is up to date. She denies polyphagia, polydipsia or polyuria.   Stress: her nephew was released from prison recently and they are excited but also it was very stressful waiting for the time he would be out, [place] in [place].   Hyperlipidemia: she is back on Crestor 40 mg and Zetia 10 mg daily  and states no longer has muscle pain or cramps. No chest pain, LDL has improved HDL is above 40. LDL was above goal at 108 . We will recheck labs  Insomnia: she was trying to wean self off of Ambien, she has 10 mg tablets, and currently taking abite off her pills, but states Ambien 5 mg is more expensiveand not enough, she thinks she is taking about 7 mg she got a refill recently.  HTN: No chest pain, palpitation or SOB ,taking medication as prescribed. She states bp at home is around 120's/70's. Denies orthostatic changes or dizziness. BP today is low, she states she has been taking Coreg two in am,  explained she needs to take it BID instead.   CAD: no episodes, s/p history of MI in 2009, taking aspirin daily, beta-blocker and statin therapy. No chest pain, no decrease in exercise tolerance. Unchanged   Asthma Mild Intermittent:: she states only coughing at most once a month and is not using medication. Denies wheezing or SOB  Abdominal discomfort: intermittently, once every couple of months, lasts a couple of days, she states feels like something is moving inside. She states in the past improved with stool softener. She denies change in bowel movements, blood in stools, dysuria or hematuria, no nausea or vomiting. Appetite is unchanged. Discussed X-ray abdomen to look for constipation, or try daily colace to see if symptoms will resolve and she chose the later.    Patient Active Problem List   Diagnosis Date Noted  . 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 Derangement of posterior horn of lateral meniscus of right knee 01/10/2016  . 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 Primary osteoarthritis of knee 01/24/2015  . Asthma, mild intermittent 11/18/2014  . Arteriosclerosis of coronary artery 11/18/2014  . Insomnia, persistent 11/18/2014  . Dyslipidemia 11/18/2014  . Acid reflux 11/18/2014  . Diabetes type 2, controlled (HCC) 11/18/2014  . Genital herpes in women 11/18/2014  . Hypertension 11/18/2014  . Asymptomatic  postmenopausal status 11/18/2014  . History of acute myocardial infarction 11/18/2014    Past Surgical History:  Procedure Laterality Date  . ABDOMINAL HYSTERECTOMY    . BREAST SURGERY     breast reduction  . REDUCTION MAMMAPLASTY Bilateral 1995  . TUBAL LIGATION      Family History  Problem Relation Age of Onset  . Hyperlipidemia Mother   . Hypertension Mother   . Stroke Mother   . Dementia Father   . Cancer Sister        breast  . Breast cancer Sister [id number]  . Asthma Brother   . Depression Brother        bipolar  . Heart disease Brother   . Heart disease Brother      Social History   Socioeconomic History  . Marital status: Married    Spouse name: [name]  . Number of children: 2  . Years of education: Not on file  . Highest education level: 12th grade  Occupational History  . 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 Financial resource strain: Not hard at all  . Food insecurity:    Worry: Never true    Inability: Never true  . Transportation needs:    Medical: No    Non-medical: No  Tobacco Use  . Smoking status: Never Smoker  . Smokeless tobacco: Never Used  Substance and Sexual Activity  . Alcohol use: No    Alcohol/week: 0.0 standard drinks  . Drug use: No  . Sexual activity: Yes    Partners: Male  Lifestyle  . Physical activity:    Days per week: 5 days    Minutes per session: 150+ min  . Stress: Not at all  Relationships  . Social connections:    Talks on phone: More than three times a week    Gets together: More than three times a week    Attends religious service: More than 4 times per year    Active member of club or organization: Yes    Attends meetings of clubs or organizations: More than 4 times per year    Relationship status: Married  . Intimate partner violence:    Fear of current or ex partner: No    Emotionally abused: No    Physically abused: No    Forced sexual activity: No  Other Topics Concern  . Not on file  Social History Narrative  . Not on file     Current Outpatient Medications:  .  acyclovir ointment (ZOVIRAX) 5 %, Apply 1 application topically 2 (two) times daily as needed. Reported on 06/13/2015, Disp: 30 g, Rfl: 0 .  albuterol (PROVENTIL HFA) 108 (90 BASE) MCG/ACT inhaler, Inhale 2 puffs into the lungs as needed., Disp: , Rfl:  .  aspirin EC 81 MG tablet, Take 1 tablet by mouth daily., Disp: , Rfl:  .  carvedilol (COREG) 6.25 MG tablet, Take 1 tablet (6.25 mg total) by mouth 2 (two) times daily., Disp: 180 tablet, Rfl: 1 .  ezetimibe (ZETIA) 10 MG tablet, Take 1 tablet (10  mg total) by mouth daily., Disp: 90 tablet, Rfl: 1 .  fluticasone furoate-vilanterol (BREO ELLIPTA) 100-25 MCG/INH AEPB, Inhale 1 puff into the lungs daily., Disp: 60 each, Rfl: 0 .  losartan (COZAAR) 50 MG tablet, Take 1 tablet (50 mg total) by mouth daily., Disp: 90 tablet, Rfl: 1 .  metFORMIN (GLUCOPHAGE-XR) 500 MG 24 hr tablet, Take 1 tablet (500 mg total) by mouth  daily with breakfast., Disp: 90 tablet, Rfl: 1 .  omeprazole (PRILOSEC) 20 MG capsule, Take 1 capsule by mouth daily as needed., Disp: , Rfl:  .  rosuvastatin (CRESTOR) 40 MG tablet, TAKE 1 TABLET (40 MG TOTAL) BY MOUTH DAILY., Disp: 90 tablet, Rfl: 1 .  valACYclovir (VALTREX) 500 MG tablet, Take 1 tablet (500 mg total) by mouth daily., Disp: 90 tablet, Rfl: 1 .  zolpidem (AMBIEN) 10 MG tablet, TAKE 1 TABLET BY MOUTH EVERYDAY AT BEDTIME, Disp: 90 tablet, Rfl: 0 .  colchicine 0.6 MG tablet, Take 1 tablet (0.6 mg total) by mouth daily. Take two at onset of gout and repeat in 1 hour, prn (Patient not taking: Reported on 02/10/2018), Disp: 30 tablet, Rfl: 0  Allergies  Allergen Reactions  . Metformin And Related Diarrhea    And nausea    I personally reviewed active problem list, medication list, allergies, family history, social history, health maintenance with the patient/caregiver today.   ROS  Constitutional: Negative for fever, positive for mild  weight change.  Respiratory: Negative for cough and shortness of breath.   Cardiovascular: Negative for chest pain or palpitations.  Gastrointestinal: Negative for abdominal pain, no bowel changes.  Musculoskeletal: Negative for gait problem or joint swelling.  Skin: Negative for rash.  Neurological: Negative for dizziness or headache.  No other specific complaints in a complete review of systems (except as listed in HPI above).  Objective  Vitals:   06/16/18 0915  BP: 106/60  Pulse: 72  Resp: 16  Temp: 98.1 F (36.7 C)  TempSrc: Oral  SpO2: 98%  Weight: 162 lb 4.8 oz  (73.6 kg)  Height: 5\' 4"  (1.626 m)    Body mass index is 27.86 kg/m.  Physical Exam  Constitutional: Patient appears well-developed and well-nourished. Overweight.  No distress.  HEENT: head atraumatic, normocephalic, pupils equal and reactive to light,  neck supple, throat within normal limits Cardiovascular: Normal rate, regular rhythm and normal heart sounds.  No murmur heard. No BLE edema. Pulmonary/Chest: Effort normal and breath sounds normal. No respiratory distress. Abdominal: Soft.  There is no tenderness. Negative CVA tenderness Normal bowel sounds, no masses  Psychiatric: Patient has a normal mood and affect. behavior is normal. Judgment and thought content normal.  Recent Results (from the past 2160 hour(s))  POCT HgB A1C     Status: Normal   Collection Time: 06/16/18  9:24 AM  Result Value Ref Range   Hemoglobin A1C     HbA1c POC (<> result, manual entry)     HbA1c, POC (prediabetic range)     HbA1c, POC (controlled diabetic range) 6.8 0.0 - 7.0 %     PHQ2/9: Depression screen [facility] 2/9 06/16/2018 02/10/2018 10/10/2017 09/27/2016 09/04/2016  Decreased Interest 0 0 0 0 0  Down, Depressed, Hopeless 1 0 0 0 0  PHQ - 2 Score 1 0 0 0 0  Altered sleeping 2 - 0 - -  Tired, decreased energy 1 - 0 - -  Change in appetite 0 - 0 - -  Feeling bad or failure about yourself  1 - 0 - -  Trouble concentrating 0 - 0 - -  Moving slowly or fidgety/restless 0 - 0 - -  Suicidal thoughts 0 - 0 - -  PHQ-9 Score 5 - 0 - -  Difficult doing work/chores Not difficult at all - Not difficult at all - -     Fall Risk: Fall Risk  06/16/2018 10/10/2017 06/11/2017 09/27/2016 09/04/2016  Falls in  the past year? 0 No No No No  Number falls in past yr: 0 - - - -  Injury with Fall? 0 - - - -  Risk for fall due to : - Impaired vision;Medication side effect - - -  Risk for fall due to: Comment - wears eyeglasses; Ambien - - -     Functional Status Survey: Is the patient deaf or have difficulty hearing?:  No Does the patient have difficulty seeing, even when wearing glasses/contacts?: Yes(contacts) Does the patient have difficulty concentrating, remembering, or making decisions?: No Does the patient have difficulty walking or climbing stairs?: No Does the patient have difficulty dressing or bathing?: No Does the patient have difficulty doing errands alone such as visiting a doctor's office or shopping?: No    Assessment & Plan  1. Type 2 diabetes mellitus with microalbuminuria, without long-term current use of insulin (HCC)  - POCT HgB A1C - metFORMIN (GLUCOPHAGE-XR) 500 MG 24 hr tablet; Take 1 tablet (500 mg total) by mouth daily with breakfast.  Dispense: 90 tablet; Refill: 1 - losartan (COZAAR) 50 MG tablet; Take 1 tablet (50 mg total) by mouth daily.  Dispense: 90 tablet; Refill: 1  2. Dyslipidemia  - rosuvastatin (CRESTOR) 40 MG tablet; TAKE 1 TABLET (40 MG TOTAL) BY MOUTH DAILY.  Dispense: 90 tablet; Refill: 1 - ezetimibe (ZETIA) 10 MG tablet; Take 1 tablet (10 mg total) by mouth daily.  Dispense: 90 tablet; Refill: 1 - Lipid panel - COMPLETE METABOLIC PANEL WITH GFR  3. History of acute myocardial infarction  - rosuvastatin (CRESTOR) 40 MG tablet; TAKE 1 TABLET (40 MG TOTAL) BY MOUTH DAILY.  Dispense: 90 tablet; Refill: 1 - losartan (COZAAR) 50 MG tablet; Take 1 tablet (50 mg total) by mouth daily.  Dispense: 90 tablet; Refill: 1 - carvedilol (COREG) 6.25 MG tablet; Take 1 tablet (6.25 mg total) by mouth 2 (two) times daily.  Dispense: 180 tablet; Refill: 1  4. Essential hypertension  - losartan (COZAAR) 50 MG tablet; Take 1 tablet (50 mg total) by mouth daily.  Dispense: 90 tablet; Refill: 1 - carvedilol (COREG) 6.25 MG tablet; Take 1 tablet (6.25 mg total) by mouth 2 (two) times daily.  Dispense: 180 tablet; Refill: 1 - COMPLETE METABOLIC PANEL WITH GFR  5. Genital herpes in women  Taking medication prn    6. Gastroesophageal reflux disease without esophagitis   7.  Arteriosclerosis of coronary artery   8. Situational depression  Stressed out about her nephew that has finally been released from prison.  So she has been emotional   9. Mild intermittent asthma with exacerbation  Doing well at this time

## 2018-06-17 LAB — COMPLETE METABOLIC PANEL WITH GFR
AG Ratio: 2 (calc) (ref 1.0–2.5)
ALT: 37 U/L — ABNORMAL HIGH (ref 6–29)
AST: 35 U/L (ref 10–35)
Albumin: 4.7 g/dL (ref 3.6–5.1)
Alkaline phosphatase (APISO): 68 U/L (ref 33–130)
BUN: 11 mg/dL (ref 7–25)
CO2: 28 mmol/L (ref 20–32)
Calcium: 9.6 mg/dL (ref 8.6–10.4)
Chloride: 103 mmol/L (ref 98–110)
Creat: 0.64 mg/dL (ref 0.50–0.99)
GFR, Est African American: 106 mL/min/{1.73_m2} (ref >=60)
GFR, Est Non African American: 92 mL/min/{1.73_m2} (ref >=60)
Globulin: 2.3 g/dL (calc) (ref 1.9–3.7)
Glucose, Bld: 127 mg/dL — ABNORMAL HIGH (ref 65–99)
Potassium: 4.4 mmol/L (ref 3.5–5.3)
Sodium: 140 mmol/L (ref 135–146)
Total Bilirubin: 0.4 mg/dL (ref 0.2–1.2)
Total Protein: 7 g/dL (ref 6.1–8.1)

## 2018-06-17 LAB — LIPID PANEL
Cholesterol: 130 mg/dL (ref <200)
HDL: 40 mg/dL — ABNORMAL LOW (ref >50)
LDL Cholesterol (Calc): 71 mg/dL (calc)
Non-HDL Cholesterol (Calc): 90 mg/dL (calc) (ref <130)
Total CHOL/HDL Ratio: 3.3 (calc) (ref <5.0)
Triglycerides: 106 mg/dL (ref <150)

## 2018-07-15 ENCOUNTER — Other Ambulatory Visit: Payer: Self-pay | Admitting: Family Medicine

## 2018-07-15 DIAGNOSIS — G4700 Insomnia, unspecified: Secondary | ICD-10-CM

## 2018-07-15 NOTE — Telephone Encounter (Signed)
Refill request for general medication. Ambien to [facility]  Last office visit 06/16/2018  Follow up on 10/15/2018

## 2018-08-04 ENCOUNTER — Ambulatory Visit (INDEPENDENT_AMBULATORY_CARE_PROVIDER_SITE_OTHER): Payer: Medicare Other | Admitting: Family Medicine

## 2018-08-04 ENCOUNTER — Ambulatory Visit
Admission: RE | Admit: 2018-08-04 | Discharge: 2018-08-04 | Disposition: A | Discharge status: Home / Self Care | Payer: Medicare Other | Attending: Family Medicine | Admitting: Family Medicine

## 2018-08-04 ENCOUNTER — Ambulatory Visit
Admission: RE | Admit: 2018-08-04 | Discharge: 2018-08-04 | Disposition: A | Discharge status: Home / Self Care | Payer: Medicare Other | Source: Ambulatory Visit | Attending: Family Medicine | Admitting: Family Medicine

## 2018-08-04 ENCOUNTER — Encounter: Payer: Self-pay | Admitting: Family Medicine

## 2018-08-04 ENCOUNTER — Other Ambulatory Visit: Payer: Self-pay

## 2018-08-04 VITALS — BP 130/78 | HR 85 | Temp 98.3°F | Resp 14 | Ht 64.0 in | Wt 163.4 lb

## 2018-08-04 DIAGNOSIS — R109 Unspecified abdominal pain: Secondary | ICD-10-CM

## 2018-08-04 DIAGNOSIS — N3001 Acute cystitis with hematuria: Secondary | ICD-10-CM | POA: Insufficient documentation

## 2018-08-04 DIAGNOSIS — K219 Gastro-esophageal reflux disease without esophagitis: Secondary | ICD-10-CM | POA: Diagnosis not present

## 2018-08-04 DIAGNOSIS — R079 Chest pain, unspecified: Secondary | ICD-10-CM | POA: Diagnosis not present

## 2018-08-04 LAB — POCT URINALYSIS DIPSTICK
Bilirubin, UA: NEGATIVE
Glucose, UA: NEGATIVE
Ketones, UA: NEGATIVE
Nitrite, UA: NEGATIVE
Protein, UA: POSITIVE — AB
Spec Grav, UA: 1.015 (ref 1.010–1.025)
Urobilinogen, UA: 0.2 E.U./dL
pH, UA: 5 (ref 5.0–8.0)

## 2018-08-04 MED ORDER — SULFAMETHOXAZOLE-TRIMETHOPRIM 800-160 MG PO TABS: 1.0000 | ORAL_TABLET | Freq: Two times a day (BID) | ORAL | 0 refills | Status: AC

## 2018-08-04 MED ORDER — OMEPRAZOLE 40 MG PO CPDR: 40.0000 mg | DELAYED_RELEASE_CAPSULE | Freq: Every day | ORAL | 3 refills | Status: DC

## 2018-08-04 NOTE — Patient Instructions (Signed)

## 2018-08-04 NOTE — Progress Notes (Signed)
Acute Office Visit  Subjective:    Patient ID: Dawn Werner, female    DOB: 08/11/1950, 68 y.o.   MRN: [id number]  Chief Complaint  Patient presents with  . Abdominal Pain    pain starts under left breast and radiates to pelvic. Feels like a cramo    HPI Patient is in today for evaluation of bilateral flank and lower ribcage discomfort for about a month, but worse in the last week.  She also notes some lower abdominal pressure and foul smelling urine x2-3 days.  She does have GERD that has been poorly controlled, she notes the discomfort is often relieved when her GERD improves.  She wants to take omeprazole, but would like the dose increased because the 20mg  PRN does not seem to be working. No difficulty swallowing, no chest pain or shortness of breath, no fevers/chills, no body aches.  Past Medical History:  Diagnosis Date  . Anxiety   . Asthma   . Diabetes mellitus without complication (HCC)   . GERD (gastroesophageal reflux disease)   . Hyperlipidemia   . Insomnia     Past Surgical History:  Procedure Laterality Date  . ABDOMINAL HYSTERECTOMY    . BREAST SURGERY     breast reduction  . REDUCTION MAMMAPLASTY Bilateral 1995  . TUBAL LIGATION      Family History  Problem Relation Age of Onset  . Hyperlipidemia Mother   . Hypertension Mother   . Stroke Mother   . Dementia Father   . Cancer Sister        breast  . Breast cancer Sister 40  . Asthma Brother   . Depression Brother        bipolar  . Heart disease Brother   . Heart disease Brother     Social History   Socioeconomic History  . Marital status: Married    Spouse name: [name]  . Number of children: 2  . Years of education: Not on file  . Highest education level: 12th grade  Occupational History  . 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 Financial resource strain: Not hard at all  . Food insecurity:    Worry: Never true    Inability: Never true  . Transportation needs:     Medical: No    Non-medical: No  Tobacco Use  . Smoking status: Never Smoker  . Smokeless tobacco: Never Used  Substance and Sexual Activity  . Alcohol use: No    Alcohol/week: 0.0 standard drinks  . Drug use: No  . Sexual activity: Yes    Partners: Male  Lifestyle  . Physical activity:    Days per week: 5 days    Minutes per session: 150+ min  . Stress: Not at all  Relationships  . Social connections:    Talks on phone: More than three times a week    Gets together: More than three times a week    Attends religious service: More than 4 times per year    Active member of club or organization: Yes    Attends meetings of clubs or organizations: More than 4 times per year    Relationship status: Married  . Intimate partner violence:    Fear of current or ex partner: No    Emotionally abused: No    Physically abused: No    Forced sexual activity: No  Other Topics Concern  . Not on file  Social History Narrative  . Not  on file    Outpatient Medications Prior to Visit  Medication Sig Dispense Refill  . acyclovir ointment (ZOVIRAX) 5 % Apply 1 application topically 2 (two) times daily as needed. Reported on 06/13/2015 30 g 0  . albuterol (PROVENTIL HFA) 108 (90 BASE) MCG/ACT inhaler Inhale 2 puffs into the lungs as needed.    Marland Kitchen aspirin EC 81 MG tablet Take 1 tablet by mouth daily.    . carvedilol (COREG) 6.25 MG tablet Take 1 tablet (6.25 mg total) by mouth 2 (two) times daily. 180 tablet 1  . colchicine 0.6 MG tablet Take 1 tablet (0.6 mg total) by mouth daily. Take two at onset of gout and repeat in 1 hour, prn 30 tablet 0  . ezetimibe (ZETIA) 10 MG tablet Take 1 tablet (10 mg total) by mouth daily. 90 tablet 1  . fluticasone furoate-vilanterol (BREO ELLIPTA) 100-25 MCG/INH AEPB Inhale 1 puff into the lungs daily. 60 each 0  . losartan (COZAAR) 50 MG tablet Take 1 tablet (50 mg total) by mouth daily. 90 tablet 1  . metFORMIN (GLUCOPHAGE-XR) 500 MG 24 hr tablet Take 1 tablet  (500 mg total) by mouth daily with breakfast. 90 tablet 1  . rosuvastatin (CRESTOR) 40 MG tablet TAKE 1 TABLET (40 MG TOTAL) BY MOUTH DAILY. 90 tablet 1  . valACYclovir (VALTREX) 500 MG tablet Take 1 tablet (500 mg total) by mouth daily. 90 tablet 1  . zolpidem (AMBIEN) 10 MG tablet TAKE 1 TABLET BY MOUTH EVERYDAY AT BEDTIME 90 tablet 0  . omeprazole (PRILOSEC) 20 MG capsule Take 1 capsule by mouth daily as needed.     No facility-administered medications prior to visit.     Allergies  Allergen Reactions  . Metformin And Related Diarrhea    And nausea    ROS Ten systems reviewed and is negative except as mentioned in HPI.    Objective:    Physical Exam  Constitutional: Patient appears well-developed and well-nourished. Obese. No distress.  HEENT: head atraumatic, normocephalic Cardiovascular: Normal rate, regular rhythm and normal heart sounds.  No murmur heard. No BLE edema. Pulmonary/Chest: Effort normal and breath sounds clear bilaterally. No respiratory distress.  She does have mild tenderness on palpation to the bilateral lower ribcage. Abdominal: Soft, bowel sounds normal, there is no tenderness, no HSM, no CVA tenderness. Psychiatric: Patient has a normal mood and affect. behavior is normal. Judgment and thought content normal.  BP 130/78 (BP Location: Right Arm, Patient Position: Sitting, Cuff Size: Large)   Pulse 85   Temp 98.3 F (36.8 C) (Oral)   Resp 14   Ht  (1.626 m)   Wt 163 lb 6.4 oz (74.1 kg)   SpO2 99%   BMI 28.05 kg/m  Wt Readings from Last 3 Encounters:  08/04/18 163 lb 6.4 oz (74.1 kg)  06/16/18 162 lb 4.8 oz (73.6 kg)  02/10/18 166 lb 1.6 oz (75.3 kg)    There are no preventive care reminders to display for this patient.  There are no preventive care reminders to display for this patient.   No results found for: TSH Lab Results  Component Value Date   WBC 9.6 07/09/2016   HGB 14.9 07/09/2016   HCT 44.6 07/09/2016   MCV 84.6 07/09/2016    PLT 305 07/09/2016   Lab Results  Component Value Date   NA 140 06/16/2018   K 4.4 06/16/2018   CO2 28 06/16/2018   GLUCOSE 127 (H) 06/16/2018   BUN 11 06/16/2018  CREATININE 0.64 06/16/2018   BILITOT 0.4 06/16/2018   ALKPHOS 72 09/26/2016   AST 35 06/16/2018   ALT 37 (H) 06/16/2018   PROT 7.0 06/16/2018   ALBUMIN 4.2 09/26/2016   CALCIUM 9.6 06/16/2018   Lab Results  Component Value Date   CHOL 130 06/16/2018   Lab Results  Component Value Date   HDL 40 (L) 06/16/2018   Lab Results  Component Value Date   LDLCALC 71 06/16/2018   Lab Results  Component Value Date   TRIG 106 06/16/2018   Lab Results  Component Value Date   CHOLHDL 3.3 06/16/2018   Lab Results  Component Value Date   HGBA1C 6.8 06/16/2018       Assessment & Plan:   Problem List Items Addressed This Visit      Digestive   Acid reflux   Relevant Medications   omeprazole (PRILOSEC) 40 MG capsule    Other Visit Diagnoses    Acute cystitis with hematuria    -  Primary   Relevant Medications   sulfamethoxazole-trimethoprim (BACTRIM DS,SEPTRA DS) 800-160 MG tablet   Other Relevant Orders   POCT urinalysis dipstick (Completed)   DG Abd 1 View   Urine Culture   Bilateral flank pain       Relevant Orders   DG Chest 2 View   DG Abd 1 View      Meds ordered this encounter  Medications  . sulfamethoxazole-trimethoprim (BACTRIM DS,SEPTRA DS) 800-160 MG tablet    Sig: Take 1 tablet by mouth 2 (two) times daily for 5 days.    Dispense:  10 tablet    Refill:  0    Order Specific Question:   Supervising Provider    Answer:   Alba Cory [[id number]]  . omeprazole (PRILOSEC) 40 MG capsule    Sig: Take 1 capsule (40 mg total) by mouth daily.    Dispense:  30 capsule    Refill:  3    Order Specific Question:   Supervising Provider    Answer:   Alba Cory [[id number]]    Doren Custard, FNP

## 2018-08-05 LAB — URINE CULTURE
MICRO NUMBER:: 240799
SPECIMEN QUALITY:: ADEQUATE

## 2018-08-07 ENCOUNTER — Ambulatory Visit: Payer: Medicare Other | Admitting: Family Medicine

## 2018-08-18 ENCOUNTER — Encounter: Payer: Self-pay | Admitting: Family Medicine

## 2018-08-18 ENCOUNTER — Ambulatory Visit (INDEPENDENT_AMBULATORY_CARE_PROVIDER_SITE_OTHER): Payer: Medicare Other | Admitting: Family Medicine

## 2018-08-18 VITALS — BP 118/64 | HR 76 | Temp 97.8°F | Resp 12 | Ht 62.0 in | Wt 167.6 lb

## 2018-08-18 DIAGNOSIS — K219 Gastro-esophageal reflux disease without esophagitis: Secondary | ICD-10-CM

## 2018-08-18 DIAGNOSIS — R142 Eructation: Secondary | ICD-10-CM | POA: Diagnosis not present

## 2018-08-18 MED ORDER — SIMETHICONE 125 MG PO TABS: 125.0000 mg | ORAL_TABLET | Freq: Four times a day (QID) | ORAL | 0 refills | Status: DC | PRN

## 2018-08-18 NOTE — Patient Instructions (Signed)
Low-FODMAP Eating Plan    FODMAPs (fermentable oligosaccharides, disaccharides, monosaccharides, and polyols) are sugars that are hard for some people to digest. A low-FODMAP eating plan may help some people who have bowel (intestinal) diseases to manage their symptoms.  This meal plan can be complicated to follow. Work with a diet and nutrition specialist (dietitian) to make a low-FODMAP eating plan that is right for you. A dietitian can make sure that you get enough nutrition from this diet.  What are tips for following this plan?  Reading food labels   Check labels for hidden FODMAPs such as:  ? High-fructose syrup.  ? Honey.  ? Agave.  ? Natural fruit flavors.  ? Onion or garlic powder.   Choose low-FODMAP foods that contain 3-4 grams of fiber per serving.   Check food labels for serving sizes. Eat only one serving at a time to make sure FODMAP levels stay low.  Meal planning   Follow a low-FODMAP eating plan for up to 6 weeks, or as told by your health care provider or dietitian.   To follow the eating plan:  1. Eliminate high-FODMAP foods from your diet completely.  2. Gradually reintroduce high-FODMAP foods into your diet one at a time. Most people should wait a few days after introducing one high-FODMAP food before they introduce the next high-FODMAP food. Your dietitian can recommend how quickly you may reintroduce foods.  3. Keep a daily record of what you eat and drink, and make note of any symptoms that you have after eating.  4. Review your daily record with a dietitian regularly. Your dietitian can help you identify which foods you can eat and which foods you should avoid.  General tips   Drink enough fluid each day to keep your urine pale yellow.   Avoid processed foods. These often have added sugar and may be high in FODMAPs.   Avoid most dairy products, whole grains, and sweeteners.   Work with a dietitian to make sure you get enough fiber in your diet.  Recommended  foods  Grains   Gluten-free grains, such as rice, oats, buckwheat, quinoa, corn, polenta, and millet. Gluten-free pasta, bread, or cereal. Rice noodles. Corn tortillas.  Vegetables   Eggplant, zucchini, cucumber, peppers, green beans, Brussels sprouts, bean sprouts, lettuce, arugula, kale, Swiss chard, spinach, collard greens, bok choy, summer squash, potato, and tomato. Limited amounts of corn, carrot, and sweet potato. Green parts of scallions.  Fruits   Bananas, oranges, lemons, limes, blueberries, raspberries, strawberries, grapes, cantaloupe, honeydew melon, kiwi, papaya, passion fruit, and pineapple. Limited amounts of dried cranberries, banana chips, and shredded coconut.  Dairy   Lactose-free milk, yogurt, and kefir. Lactose-free cottage cheese and ice cream. Non-dairy milks, such as almond, coconut, hemp, and rice milk. Yogurts made of non-dairy milks. Limited amounts of goat cheese, brie, mozzarella, parmesan, swiss, and other hard cheeses.  Meats and other protein foods   Unseasoned beef, pork, poultry, or fish. Eggs. Bacon. Tofu (firm) and tempeh. Limited amounts of nuts and seeds, such as almonds, walnuts, brazil nuts, pecans, peanuts, pumpkin seeds, chia seeds, and sunflower seeds.  Fats and oils   Butter-free spreads. Vegetable oils, such as olive, canola, and sunflower oil.  Seasoning and other foods   Artificial sweeteners with names that do not end in "ol" such as aspartame, saccharine, and stevia. Maple syrup, white table sugar, raw sugar, brown sugar, and molasses. Fresh basil, coriander, parsley, rosemary, and thyme.  Beverages   Water and mineral water.   Sugar-sweetened soft drinks. Small amounts of orange juice or cranberry juice. Black and green tea. Most dry wines. Coffee.  This may not be a complete list of low-FODMAP foods. Talk with your dietitian for more information.  Foods to avoid  Grains   Wheat, including kamut, durum, and semolina. Barley and bulgur. Couscous. Wheat-based  cereals. Wheat noodles, bread, crackers, and pastries.  Vegetables   Chicory root, artichoke, asparagus, cabbage, snow peas, sugar snap peas, mushrooms, and cauliflower. Onions, garlic, leeks, and the white part of scallions.  Fruits   Fresh, dried, and juiced forms of apple, pear, watermelon, peach, plum, cherries, apricots, blackberries, boysenberries, figs, nectarines, and mango. Avocado.  Dairy   Milk, yogurt, ice cream, and soft cheese. Cream and sour cream. Milk-based sauces. Custard.  Meats and other protein foods   Fried or fatty meat. Sausage. Cashews and pistachios. Soybeans, baked beans, black beans, chickpeas, kidney beans, fava beans, navy beans, lentils, and split peas.  Seasoning and other foods   Any sugar-free gum or candy. Foods that contain artificial sweeteners such as sorbitol, mannitol, isomalt, or xylitol. Foods that contain honey, high-fructose corn syrup, or agave. Bouillon, vegetable stock, beef stock, and chicken stock. Garlic and onion powder. Condiments made with onion, such as hummus, chutney, pickles, relish, salad dressing, and salsa. Tomato paste.  Beverages   Chicory-based drinks. Coffee substitutes. Chamomile tea. Fennel tea. Sweet or fortified wines such as port or sherry. Diet soft drinks made with isomalt, mannitol, maltitol, sorbitol, or xylitol. Apple, pear, and mango juice. Juices with high-fructose corn syrup.  This may not be a complete list of high-FODMAP foods. Talk with your dietitian to discuss what dietary choices are best for you.   Summary   A low-FODMAP eating plan is a short-term diet that eliminates FODMAPs from your diet to help ease symptoms of certain bowel diseases.   The eating plan usually lasts up to 6 weeks. After that, high-FODMAP foods are restarted gradually, one at a time, so you can find out which may be causing symptoms.   A low-FODMAP eating plan can be complicated. It is best to work with a dietitian who has experience with this type of  plan.  This information is not intended to replace advice given to you by your health care provider. Make sure you discuss any questions you have with your health care provider.  Document Released: 01/21/2017 Document Revised: 01/21/2017 Document Reviewed: 01/21/2017  Elsevier Interactive Patient Education  2019 [employer]

## 2018-08-18 NOTE — Progress Notes (Signed)
Established Patient Office Visit  Subjective:  Patient ID: Dawn Werner, female    DOB: December 27, 1950  Age: 68 y.o. MRN: [id number]  CC:  Chief Complaint  Patient presents with  . Follow-up    2 weeks for flank pain    HPI Dawn Werner presents for recheck of acute cystitis and Gerd.   Acute cystitis: recheck after completion of course of bactrim. She reports resolution of her flank pain. She denies urinary frequency, dysuria, hematuria or any foul odor to her urine.   GERD: She reports that symptoms of relux have improved with omeprazole, but that she is having some continued issues with belching. She denies heart burn or abdominal pain but reports increased belching 2-3x/week. She has not taken any medication for her symptoms but does notice a correlation with the foods she's eating and her symptoms.   Past Medical History:  Diagnosis Date  . Anxiety   . Asthma   . Diabetes mellitus without complication (HCC)   . GERD (gastroesophageal reflux disease)   . Hyperlipidemia   . Insomnia     Past Surgical History:  Procedure Laterality Date  . ABDOMINAL HYSTERECTOMY    . BREAST SURGERY     breast reduction  . REDUCTION MAMMAPLASTY Bilateral 1995  . TUBAL LIGATION      Family History  Problem Relation Age of Onset  . Hyperlipidemia Mother   . Hypertension Mother   . Stroke Mother   . Dementia Father   . Cancer Sister        breast  . Breast cancer Sister 62  . Asthma Brother   . Depression Brother        bipolar  . Heart disease Brother   . Heart disease Brother     Social History   Socioeconomic History  . Marital status: Married    Spouse name: [name]  . Number of children: 2  . Years of education: Not on file  . Highest education level: 12th grade  Occupational History  . 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 Financial resource strain: Not hard at all  . Food insecurity:    Worry: Never true    Inability: Never true  .  Transportation needs:    Medical: No    Non-medical: No  Tobacco Use  . Smoking status: Never Smoker  . Smokeless tobacco: Never Used  Substance and Sexual Activity  . Alcohol use: No    Alcohol/week: 0.0 standard drinks  . Drug use: No  . Sexual activity: Yes    Partners: Male  Lifestyle  . Physical activity:    Days per week: 5 days    Minutes per session: 150+ min  . Stress: Not at all  Relationships  . Social connections:    Talks on phone: More than three times a week    Gets together: More than three times a week    Attends religious service: More than 4 times per year    Active member of club or organization: Yes    Attends meetings of clubs or organizations: More than 4 times per year    Relationship status: Married  . Intimate partner violence:    Fear of current or ex partner: No    Emotionally abused: No    Physically abused: No    Forced sexual activity: No  Other Topics Concern  . Not on file  Social History Narrative  . Not on file  Outpatient Medications Prior to Visit  Medication Sig Dispense Refill  . acyclovir ointment (ZOVIRAX) 5 % Apply 1 application topically 2 (two) times daily as needed. Reported on 06/13/2015 30 g 0  . albuterol (PROVENTIL HFA) 108 (90 BASE) MCG/ACT inhaler Inhale 2 puffs into the lungs as needed.    Marland Kitchen aspirin EC 81 MG tablet Take 1 tablet by mouth daily.    . carvedilol (COREG) 6.25 MG tablet Take 1 tablet (6.25 mg total) by mouth 2 (two) times daily. 180 tablet 1  . colchicine 0.6 MG tablet Take 1 tablet (0.6 mg total) by mouth daily. Take two at onset of gout and repeat in 1 hour, prn 30 tablet 0  . ezetimibe (ZETIA) 10 MG tablet Take 1 tablet (10 mg total) by mouth daily. 90 tablet 1  . fluticasone furoate-vilanterol (BREO ELLIPTA) 100-25 MCG/INH AEPB Inhale 1 puff into the lungs daily. 60 each 0  . losartan (COZAAR) 50 MG tablet Take 1 tablet (50 mg total) by mouth daily. 90 tablet 1  . metFORMIN (GLUCOPHAGE-XR) 500 MG 24 hr  tablet Take 1 tablet (500 mg total) by mouth daily with breakfast. 90 tablet 1  . omeprazole (PRILOSEC) 40 MG capsule Take 1 capsule (40 mg total) by mouth daily. 30 capsule 3  . rosuvastatin (CRESTOR) 40 MG tablet TAKE 1 TABLET (40 MG TOTAL) BY MOUTH DAILY. 90 tablet 1  . valACYclovir (VALTREX) 500 MG tablet Take 1 tablet (500 mg total) by mouth daily. 90 tablet 1  . zolpidem (AMBIEN) 10 MG tablet TAKE 1 TABLET BY MOUTH EVERYDAY AT BEDTIME 90 tablet 0   No facility-administered medications prior to visit.     Allergies  Allergen Reactions  . Metformin And Related Diarrhea    And nausea    ROS Review of Systems  Constitutional: Negative.   Eyes: Negative.   Respiratory: Negative.   Cardiovascular: Negative.   Gastrointestinal: Negative.  Negative for abdominal pain and nausea.       Positive for belching  Endocrine: Negative.   Genitourinary: Negative for difficulty urinating, dysuria, flank pain, frequency, hematuria and urgency.  Musculoskeletal: Negative.   Skin: Negative.   Allergic/Immunologic: Negative.   Neurological: Negative.   Hematological: Negative.   Psychiatric/Behavioral: Negative.      Objective:    Physical Exam  Constitutional: She is oriented to person, place, and time. She appears well-developed and well-nourished.  HENT:  Head: Normocephalic and atraumatic.  Eyes: Pupils are equal, round, and reactive to light.  Neck: Normal range of motion. Neck supple.  Cardiovascular: Normal rate, regular rhythm, normal heart sounds and intact distal pulses.  Pulmonary/Chest: Effort normal and breath sounds normal.  Abdominal: Soft. Bowel sounds are normal. She exhibits no distension. There is no abdominal tenderness.  Musculoskeletal: Normal range of motion.  Neurological: She is alert and oriented to person, place, and time. She has normal reflexes.  Skin: Skin is warm and dry.  Psychiatric: She has a normal mood and affect. Her behavior is normal. Judgment and  thought content normal.   BP 118/64   Pulse 76   Temp 97.8 F (36.6 C) (Oral)   Resp 12   Ht 5\' 2"  (1.575 m)   Wt 167 lb 9.6 oz (76 kg)   SpO2 98%   BMI 30.65 kg/m  Wt Readings from Last 3 Encounters:  08/18/18 167 lb 9.6 oz (76 kg)  08/04/18 163 lb 6.4 oz (74.1 kg)  06/16/18 162 lb 4.8 oz (73.6 kg)  There are no preventive care reminders to display for this patient.  There are no preventive care reminders to display for this patient.  No results found for: TSH Lab Results  Component Value Date   WBC 9.6 07/09/2016   HGB 14.9 07/09/2016   HCT 44.6 07/09/2016   MCV 84.6 07/09/2016   PLT 305 07/09/2016   Lab Results  Component Value Date   NA 140 06/16/2018   K 4.4 06/16/2018   CO2 28 06/16/2018   GLUCOSE 127 (H) 06/16/2018   BUN 11 06/16/2018   CREATININE 0.64 06/16/2018   BILITOT 0.4 06/16/2018   ALKPHOS 72 09/26/2016   AST 35 06/16/2018   ALT 37 (H) 06/16/2018   PROT 7.0 06/16/2018   ALBUMIN 4.2 09/26/2016   CALCIUM 9.6 06/16/2018   Lab Results  Component Value Date   CHOL 130 06/16/2018   Lab Results  Component Value Date   HDL 40 (L) 06/16/2018   Lab Results  Component Value Date   LDLCALC 71 06/16/2018   Lab Results  Component Value Date   TRIG 106 06/16/2018   Lab Results  Component Value Date   CHOLHDL 3.3 06/16/2018   Lab Results  Component Value Date   HGBA1C 6.8 06/16/2018      Assessment & Plan:    Problem List Items Addressed This Visit      Digestive   Acid reflux    Discussed strategies to help reflux: - Elevate your head of bed - either with a few extra pillows, a wedge pillow, or by placing two bed risers under the head of bed posts. - Avoid the following foods: citrus, fatty foods, chocolate, peppermint, and excessive alcohol, along with sodas, orange juice (acidic drinks) - Stop eating at least 3 hours before going to bed, minimize naps/laying down after eating. - No smoking. - If you are overweight or obese,  exercising and losing weight will also help your symptoms. - Caution: prolonged use of proton pump inhibitors like omeprazole (Prilosec), pantoprazole (Protonix), esomeprazole (Nexium), and others like Dexilant and Aciphex may increase your risk of pneumonia, Clostridium difficile colitis, osteoporosis, anemia and other health complications       Relevant Medications   Simethicone 125 MG TABS     Other   Belching - Primary    Belching: follow fodmap diet to determine which foods are causing symptoms. Continue omeprazole at this time. Use simethicone as needed for symptoms.      Relevant Medications   Simethicone 125 MG TABS      Meds ordered this encounter  Medications  . Simethicone 125 MG TABS    Sig: Take 1 tablet (125 mg total) by mouth 4 (four) times daily as needed (For Belching or Flatulence).    Dispense:  120 tablet    Refill:  0    Order Specific Question:   Supervising Provider    Answer:   Alba Cory [[id number]]    Follow-up: Return if symptoms worsen or fail to improve.    Doren Custard, FNP

## 2018-08-18 NOTE — Assessment & Plan Note (Signed)
Belching: follow fodmap diet to determine which foods are causing symptoms. Continue omeprazole at this time. Use simethicone as needed for symptoms.

## 2018-08-18 NOTE — Assessment & Plan Note (Signed)
Discussed strategies to help reflux: - Elevate your head of bed - either with a few extra pillows, a wedge pillow, or by placing two bed risers under the head of bed posts. - Avoid the following foods: citrus, fatty foods, chocolate, peppermint, and excessive alcohol, along with sodas, orange juice (acidic drinks) - Stop eating at least 3 hours before going to bed, minimize naps/laying down after eating. - No smoking. - If you are overweight or obese, exercising and losing weight will also help your symptoms. - Caution: prolonged use of proton pump inhibitors like omeprazole (Prilosec), pantoprazole (Protonix), esomeprazole (Nexium), and others like Dexilant and Aciphex may increase your risk of pneumonia, Clostridium difficile colitis, osteoporosis, anemia and other health complications

## 2018-08-20 ENCOUNTER — Other Ambulatory Visit: Payer: Self-pay | Admitting: Family Medicine

## 2018-08-20 DIAGNOSIS — Z1231 Encounter for screening mammogram for malignant neoplasm of breast: Secondary | ICD-10-CM

## 2018-10-01 DIAGNOSIS — M1612 Unilateral primary osteoarthritis, left hip: Secondary | ICD-10-CM | POA: Diagnosis not present

## 2018-10-01 DIAGNOSIS — M7062 Trochanteric bursitis, left hip: Secondary | ICD-10-CM | POA: Diagnosis not present

## 2018-10-05 ENCOUNTER — Other Ambulatory Visit: Payer: Self-pay

## 2018-10-05 ENCOUNTER — Encounter: Payer: Self-pay | Admitting: Family Medicine

## 2018-10-05 ENCOUNTER — Ambulatory Visit (INDEPENDENT_AMBULATORY_CARE_PROVIDER_SITE_OTHER): Payer: Medicare Other | Admitting: Family Medicine

## 2018-10-05 ENCOUNTER — Telehealth: Payer: Self-pay | Admitting: Family Medicine

## 2018-10-05 NOTE — Telephone Encounter (Signed)
errenous °

## 2018-10-06 NOTE — Progress Notes (Signed)
Erroneous

## 2018-10-12 ENCOUNTER — Other Ambulatory Visit: Payer: Self-pay | Admitting: Family Medicine

## 2018-10-12 DIAGNOSIS — G4700 Insomnia, unspecified: Secondary | ICD-10-CM

## 2018-10-12 NOTE — Telephone Encounter (Signed)
Refill request for general medication. Ambien to [facility]  Last office visit 06/16/2018   Follow up 10/15/2018

## 2018-10-15 ENCOUNTER — Encounter: Payer: Self-pay | Admitting: Family Medicine

## 2018-10-15 ENCOUNTER — Ambulatory Visit (INDEPENDENT_AMBULATORY_CARE_PROVIDER_SITE_OTHER): Payer: Medicare Other

## 2018-10-15 ENCOUNTER — Other Ambulatory Visit: Payer: Self-pay

## 2018-10-15 ENCOUNTER — Ambulatory Visit (INDEPENDENT_AMBULATORY_CARE_PROVIDER_SITE_OTHER): Payer: Medicare Other | Admitting: Family Medicine

## 2018-10-15 VITALS — BP 126/70 | HR 73 | Temp 98.1°F | Resp 16 | Ht 62.0 in | Wt 159.9 lb

## 2018-10-15 VITALS — BP 120/70 | HR 73 | Temp 98.1°F | Resp 16 | Ht 62.0 in | Wt 159.9 lb

## 2018-10-15 DIAGNOSIS — I252 Old myocardial infarction: Secondary | ICD-10-CM

## 2018-10-15 DIAGNOSIS — F341 Dysthymic disorder: Secondary | ICD-10-CM

## 2018-10-15 DIAGNOSIS — E1129 Type 2 diabetes mellitus with other diabetic kidney complication: Secondary | ICD-10-CM

## 2018-10-15 DIAGNOSIS — J4520 Mild intermittent asthma, uncomplicated: Secondary | ICD-10-CM | POA: Diagnosis not present

## 2018-10-15 DIAGNOSIS — R809 Proteinuria, unspecified: Secondary | ICD-10-CM | POA: Diagnosis not present

## 2018-10-15 DIAGNOSIS — I10 Essential (primary) hypertension: Secondary | ICD-10-CM | POA: Diagnosis not present

## 2018-10-15 DIAGNOSIS — E785 Hyperlipidemia, unspecified: Secondary | ICD-10-CM

## 2018-10-15 DIAGNOSIS — K219 Gastro-esophageal reflux disease without esophagitis: Secondary | ICD-10-CM

## 2018-10-15 DIAGNOSIS — Z0000 Encounter for general adult medical examination without abnormal findings: Secondary | ICD-10-CM

## 2018-10-15 DIAGNOSIS — G4700 Insomnia, unspecified: Secondary | ICD-10-CM

## 2018-10-15 DIAGNOSIS — A6009 Herpesviral infection of other urogenital tract: Secondary | ICD-10-CM

## 2018-10-15 DIAGNOSIS — Z780 Asymptomatic menopausal state: Secondary | ICD-10-CM | POA: Diagnosis not present

## 2018-10-15 DIAGNOSIS — I2510 Atherosclerotic heart disease of native coronary artery without angina pectoris: Secondary | ICD-10-CM

## 2018-10-15 LAB — POCT GLYCOSYLATED HEMOGLOBIN (HGB A1C): HbA1c, POC (controlled diabetic range): 7 % (ref 0.0–7.0)

## 2018-10-15 MED ORDER — VALACYCLOVIR HCL 500 MG PO TABS: 500.0000 mg | ORAL_TABLET | Freq: Every day | ORAL | 1 refills | Status: DC

## 2018-10-15 MED ORDER — ACYCLOVIR 5 % EX OINT: 1.0000 "application " | TOPICAL_OINTMENT | Freq: Two times a day (BID) | CUTANEOUS | 0 refills | Status: DC | PRN

## 2018-10-15 MED ORDER — ZOLPIDEM TARTRATE 10 MG PO TABS: 10.0000 mg | ORAL_TABLET | Freq: Every day | ORAL | 0 refills | Status: DC

## 2018-10-15 NOTE — Patient Instructions (Signed)
Dawn Werner , Thank you for taking time to come for your Medicare Wellness Visit. I appreciate your ongoing commitment to your health goals. Please review the following plan we discussed and let me know if I can assist you in the future.   Screening recommendations/referrals: Colonoscopy: done 06/18/2013. Repeat in 2025. Mammogram: done 09/23/17. Scheduled for 11/17/18. Bone Density: done 2008. Due. Please call [phone] to schedule your bone density screening.  Recommended yearly ophthalmology/optometry visit for glaucoma screening and checkup Recommended yearly dental visit for hygiene and checkup  Vaccinations: Influenza vaccine: done 02/10/18 Pneumococcal vaccine: done 01/10/16 Tdap vaccine: done 01/29/17 Shingles vaccine: Shingrix discussed. Please contact your pharmacy for coverage information.    Advanced directives: Advance directive discussed with you today. I have provided a copy for you to complete at home and have notarized. Once this is complete please bring a copy in to our office so we can scan it into your chart.  Conditions/risks identified: Continue physical activity and healthy eating to maintain weight.   Next appointment: Please follow up in one year for your Medicare Annual Wellness visit.     Preventive Care 70 Years and Older, Female Preventive care refers to lifestyle choices and visits with your health care provider that can promote health and wellness. What does preventive care include?  A yearly physical exam. This is also called an annual well check.  Dental exams once or twice a year.  Routine eye exams. Ask your health care provider how often you should have your eyes checked.  Personal lifestyle choices, including:  Daily care of your teeth and gums.  Regular physical activity.  Eating a healthy diet.  Avoiding tobacco and drug use.  Limiting alcohol use.  Practicing safe sex.  Taking low-dose aspirin every day.  Taking vitamin and mineral  supplements as recommended by your health care provider. What happens during an annual well check? The services and screenings done by your health care provider during your annual well check will depend on your age, overall health, lifestyle risk factors, and family history of disease. Counseling  Your health care provider may ask you questions about your:  Alcohol use.  Tobacco use.  Drug use.  Emotional well-being.  Home and relationship well-being.  Sexual activity.  Eating habits.  History of falls.  Memory and ability to understand (cognition).  Work and work Astronomer.  Reproductive health. Screening  You may have the following tests or measurements:  Height, weight, and BMI.  Blood pressure.  Lipid and cholesterol levels. These may be checked every 5 years, or more frequently if you are over 18 years old.  Skin check.  Lung cancer screening. You may have this screening every year starting at age 7 if you have a 30-pack-year history of smoking and currently smoke or have quit within the past 15 years.  Fecal occult blood test (FOBT) of the stool. You may have this test every year starting at age 63.  Flexible sigmoidoscopy or colonoscopy. You may have a sigmoidoscopy every 5 years or a colonoscopy every 10 years starting at age 63.  Hepatitis C blood test.  Hepatitis B blood test.  Sexually transmitted disease (STD) testing.  Diabetes screening. This is done by checking your blood sugar (glucose) after you have not eaten for a while (fasting). You may have this done every 1-3 years.  Bone density scan. This is done to screen for osteoporosis. You may have this done starting at age 42.  Mammogram. This may be done  every 1-2 years. Talk to your health care provider about how often you should have regular mammograms. Talk with your health care provider about your test results, treatment options, and if necessary, the need for more tests. Vaccines  Your  health care provider may recommend certain vaccines, such as:  Influenza vaccine. This is recommended every year.  Tetanus, diphtheria, and acellular pertussis (Tdap, Td) vaccine. You may need a Td booster every 10 years.  Zoster vaccine. You may need this after age 62.  Pneumococcal 13-valent conjugate (PCV13) vaccine. One dose is recommended after age 35.  Pneumococcal polysaccharide (PPSV23) vaccine. One dose is recommended after age 72. Talk to your health care provider about which screenings and vaccines you need and how often you need them. This information is not intended to replace advice given to you by your health care provider. Make sure you discuss any questions you have with your health care provider. Document Released: 06/23/2015 Document Revised: 02/14/2016 Document Reviewed: 03/28/2015 Elsevier Interactive Patient Education  2017 [employer] Prevention in the Home Falls can cause injuries. They can happen to people of all ages. There are many things you can do to make your home safe and to help prevent falls. What can I do on the outside of my home?  Regularly fix the edges of walkways and driveways and fix any cracks.  Remove anything that might make you trip as you walk through a door, such as a raised step or threshold.  Trim any bushes or trees on the path to your home.  Use bright outdoor lighting.  Clear any walking paths of anything that might make someone trip, such as rocks or tools.  Regularly check to see if handrails are loose or broken. Make sure that both sides of any steps have handrails.  Any raised decks and porches should have guardrails on the edges.  Have any leaves, snow, or ice cleared regularly.  Use sand or salt on walking paths during winter.  Clean up any spills in your garage right away. This includes oil or grease spills. What can I do in the bathroom?  Use night lights.  Install grab bars by the toilet and in the tub and  shower. Do not use towel bars as grab bars.  Use non-skid mats or decals in the tub or shower.  If you need to sit down in the shower, use a plastic, non-slip stool.  Keep the floor dry. Clean up any water that spills on the floor as soon as it happens.  Remove soap buildup in the tub or shower regularly.  Attach bath mats securely with double-sided non-slip rug tape.  Do not have throw rugs and other things on the floor that can make you trip. What can I do in the bedroom?  Use night lights.  Make sure that you have a light by your bed that is easy to reach.  Do not use any sheets or blankets that are too big for your bed. They should not hang down onto the floor.  Have a firm chair that has side arms. You can use this for support while you get dressed.  Do not have throw rugs and other things on the floor that can make you trip. What can I do in the kitchen?  Clean up any spills right away.  Avoid walking on wet floors.  Keep items that you use a lot in easy-to-reach places.  If you need to reach something above you, use a  strong step stool that has a grab bar.  Keep electrical cords out of the way.  Do not use floor polish or wax that makes floors slippery. If you must use wax, use non-skid floor wax.  Do not have throw rugs and other things on the floor that can make you trip. What can I do with my stairs?  Do not leave any items on the stairs.  Make sure that there are handrails on both sides of the stairs and use them. Fix handrails that are broken or loose. Make sure that handrails are as long as the stairways.  Check any carpeting to make sure that it is firmly attached to the stairs. Fix any carpet that is loose or worn.  Avoid having throw rugs at the top or bottom of the stairs. If you do have throw rugs, attach them to the floor with carpet tape.  Make sure that you have a light switch at the top of the stairs and the bottom of the stairs. If you do not  have them, ask someone to add them for you. What else can I do to help prevent falls?  Wear shoes that:  Do not have high heels.  Have rubber bottoms.  Are comfortable and fit you well.  Are closed at the toe. Do not wear sandals.  If you use a stepladder:  Make sure that it is fully opened. Do not climb a closed stepladder.  Make sure that both sides of the stepladder are locked into place.  Ask someone to hold it for you, if possible.  Clearly mark and make sure that you can see:  Any grab bars or handrails.  First and last steps.  Where the edge of each step is.  Use tools that help you move around (mobility aids) if they are needed. These include:  Canes.  Walkers.  Scooters.  Crutches.  Turn on the lights when you go into a dark area. Replace any light bulbs as soon as they burn out.  Set up your furniture so you have a clear path. Avoid moving your furniture around.  If any of your floors are uneven, fix them.  If there are any pets around you, be aware of where they are.  Review your medicines with your doctor. Some medicines can make you feel dizzy. This can increase your chance of falling. Ask your doctor what other things that you can do to help prevent falls. This information is not intended to replace advice given to you by your health care provider. Make sure you discuss any questions you have with your health care provider. Document Released: 03/23/2009 Document Revised: 11/02/2015 Document Reviewed: 07/01/2014 Elsevier Interactive Patient Education  2017 [employer].

## 2018-10-15 NOTE — Progress Notes (Signed)
Name: Dawn Werner   MRN: [id number]    DOB: 1950/06/24   Date:10/15/2018       Progress Note  Subjective  Chief Complaint  Chief Complaint  Patient presents with  . Medication Refill  . Diabetes  . Hypertension  . Dyslipidemia  . Insomnia    HPI  DMII: she has not been taking medication, metformin caused diarrhea, but doing well on Metformin ER She has been exercising daily, still doing yard work , such as Pharmacist, community went down from 7.8% to 7.3%, up to 7.4%, 7.3%,6.9%, 6.8%, 7.5%6.8% down to 7% today Shehas history of nephropathy and is on ARB, no neuropathy, eye exam is up to date. She denies polyphagia, polydipsia or polyuria. She has been tolerating metformin ER and has noticed some weight loss , looked back she lost 6 lbs in the past year.   Hyperlipidemia: she is back on Crestor 40 mg and Zetia 10 mg daily  and states no longer has muscle pain or cramps. No chest pain LDL was down to 71 . Continue medication   Insomnia: she was trying to wean self off of Ambien, she has 10 mg tablets, and currently taking abite off her pills, but states Ambien 5 mg is more expensiveand not enough, she thinks she is taking about 7 mg she needs a refill today   HTN: No chest pain, palpitation or SOB ,taking medication as prescribed. She states bp at home is around 120's/70's. Denies orthostatic changes or dizziness.BP is at goal   CAD: no episodes, s/p history of MI in 2009, taking aspirin daily, beta-blocker and statin therapy. No chest pain, no decrease in exercise tolerance. She denies any problems at this time  Asthma Mild Intermittent:: she states only coughing at most once a month and is not using medication. Denies wheezing or SOB  Atherosclerosis of aorta: on statin and aspirin  Patient Active Problem List   Diagnosis Date Noted  . Belching 08/18/2018  . 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 Derangement of posterior horn of lateral meniscus of  right knee 01/10/2016  . 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 Primary osteoarthritis of knee 01/24/2015  . Asthma, mild intermittent 11/18/2014  . Arteriosclerosis of coronary artery 11/18/2014  . Insomnia, persistent 11/18/2014  . Dyslipidemia 11/18/2014  . Acid reflux 11/18/2014  . Diabetes type 2, controlled (HCC) 11/18/2014  . Genital herpes in women 11/18/2014  . Hypertension 11/18/2014  . Asymptomatic postmenopausal status 11/18/2014  . History of acute myocardial infarction 11/18/2014    Past Surgical History:  Procedure Laterality Date  . ABDOMINAL HYSTERECTOMY    . BREAST SURGERY     breast reduction  . REDUCTION MAMMAPLASTY Bilateral 1995  . TUBAL LIGATION      Family History  Problem Relation Age of Onset  . Hyperlipidemia Mother   . Hypertension Mother   . Stroke Mother   . Dementia Father   . Cancer Sister        breast  . Breast cancer Sister 39  . Asthma Brother   . Depression Brother        bipolar  . Heart disease Brother   . Heart disease Brother     Social History   Socioeconomic History  . Marital status: Married    Spouse name: [name]  . Number of children: 2  . Years of education: Not on file  . Highest education level: 12th grade  Occupational History  . 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 Financial resource strain: Not hard at all  . Food insecurity:    Worry: Never true    Inability: Never true  . Transportation needs:    Medical: No    Non-medical: No  Tobacco Use  . Smoking status: Never Smoker  . Smokeless tobacco: Never Used  Substance and Sexual Activity  . Alcohol use: No    Alcohol/week: 0.0 standard drinks  . Drug use: No  . Sexual activity: Yes    Partners: Male  Lifestyle  . Physical activity:    Days per week: 7 days    Minutes per session: 30 min  . Stress: Not at all  Relationships  . Social connections:    Talks on phone: More than three times a week    Gets together: More than three times a week     Attends religious service: More than 4 times per year    Active member of club or organization: Yes    Attends meetings of clubs or organizations: More than 4 times per year    Relationship status: Married  . Intimate partner violence:    Fear of current or ex partner: No    Emotionally abused: No    Physically abused: No    Forced sexual activity: No  Other Topics Concern  . Not on file  Social History Narrative  . Not on file     Current Outpatient Medications:  .  acyclovir ointment (ZOVIRAX) 5 %, Apply 1 application topically 2 (two) times daily as needed. Reported on 06/13/2015, Disp: 30 g, Rfl: 0 .  albuterol (PROVENTIL HFA) 108 (90 BASE) MCG/ACT inhaler, Inhale 2 puffs into the lungs as needed., Disp: , Rfl:  .  aspirin EC 81 MG tablet, Take 1 tablet by mouth daily., Disp: , Rfl:  .  carvedilol (COREG) 6.25 MG tablet, Take 1 tablet (6.25 mg total) by mouth 2 (two) times daily., Disp: 180 tablet, Rfl: 1 .  colchicine 0.6 MG tablet, Take 1 tablet (0.6 mg total) by mouth daily. Take two at onset of gout and repeat in 1 hour, prn (Patient not taking: Reported on 10/15/2018), Disp: 30 tablet, Rfl: 0 .  ezetimibe (ZETIA) 10 MG tablet, Take 1 tablet (10 mg total) by mouth daily., Disp: 90 tablet, Rfl: 1 .  fluticasone furoate-vilanterol (BREO ELLIPTA) 100-25 MCG/INH AEPB, Inhale 1 puff into the lungs daily. (Patient taking differently: Inhale 1 puff into the lungs daily. Takes PRN), Disp: 60 each, Rfl: 0 .  losartan (COZAAR) 50 MG tablet, Take 1 tablet (50 mg total) by mouth daily., Disp: 90 tablet, Rfl: 1 .  metFORMIN (GLUCOPHAGE-XR) 500 MG 24 hr tablet, Take 1 tablet (500 mg total) by mouth daily with breakfast., Disp: 90 tablet, Rfl: 1 .  omeprazole (PRILOSEC) 40 MG capsule, Take 1 capsule (40 mg total) by mouth daily., Disp: 30 capsule, Rfl: 3 .  rosuvastatin (CRESTOR) 40 MG tablet, TAKE 1 TABLET (40 MG TOTAL) BY MOUTH DAILY., Disp: 90 tablet, Rfl: 1 .  Simethicone 125 MG TABS, Take 1  tablet (125 mg total) by mouth 4 (four) times daily as needed (For Belching or Flatulence)., Disp: 120 tablet, Rfl: 0 .  valACYclovir (VALTREX) 500 MG tablet, Take 1 tablet (500 mg total) by mouth daily., Disp: 90 tablet, Rfl: 1 .  zolpidem (AMBIEN) 10 MG tablet, TAKE 1 TABLET BY MOUTH EVERYDAY AT BEDTIME, Disp: 90 tablet, Rfl: 0  Allergies  Allergen Reactions  . Metformin And Related Diarrhea  And nausea. Tolerates XR formulation    I personally reviewed active problem list, medication list, allergies, family history, social history with the patient/caregiver today.   ROS  Ten systems reviewed and is negative except as mentioned in HPI  Objective  Vitals:   10/15/18 0928  BP: 120/70  Pulse: 73  Resp: 16  Temp: 98.1 F (36.7 C)  TempSrc: Oral  SpO2: 95%  Weight: 159 lb 14.4 oz (72.5 kg)  Height: 5\' 2"  (1.575 m)    Body mass index is 29.25 kg/m.  Physical Exam  Constitutional: Patient appears well-developed and well-nourished. Overweight.  No distress.  HEENT: head atraumatic, normocephalic, pupils equal and reactive to light, neck supple, throat within normal limits Cardiovascular: Normal rate, regular rhythm and normal heart sounds.  No murmur heard. No BLE edema. Pulmonary/Chest: Effort normal and breath sounds normal. No respiratory distress. Abdominal: Soft.  There is no tenderness. Psychiatric: Patient has a normal mood and affect. behavior is normal. Judgment and thought content normal.  Recent Results (from the past 2160 hour(s))  POCT urinalysis dipstick     Status: Abnormal   Collection Time: 08/04/18  9:56 AM  Result Value Ref Range   Color, UA gold    Clarity, UA clear    Glucose, UA Negative Negative   Bilirubin, UA negative    Ketones, UA negative    Spec Grav, UA 1.015 1.010 - 1.025   Blood, UA large    pH, UA 5.0 5.0 - 8.0   Protein, UA Positive (A) Negative   Urobilinogen, UA 0.2 0.2 or 1.0 E.U./dL   Nitrite, UA negative    Leukocytes, UA  Small (1+) (A) Negative   Appearance clear    Odor strong   Urine Culture     Status: None   Collection Time: 08/04/18 10:47 AM  Result Value Ref Range   MICRO NUMBER: [id number]    SPECIMEN QUALITY: Adequate    Sample Source URINE    STATUS: FINAL    ISOLATE 1:      Multiple organisms present, each less than 10,000 CFU/mL. These organisms, commonly found on external and internal genitalia, are considered to be colonizers. No further testing performed.      PHQ2/9: Depression screen [facility] 2/9 10/15/2018 08/18/2018 08/04/2018 06/16/2018 02/10/2018  Decreased Interest 0 0 0 0 0  Down, Depressed, Hopeless 1 0 0 1 0  PHQ - 2 Score 1 0 0 1 0  Altered sleeping 1 0 1 2 -  Tired, decreased energy 0 0 0 1 -  Change in appetite 0 0 0 0 -  Feeling bad or failure about yourself  0 0 0 1 -  Trouble concentrating 0 0 0 0 -  Moving slowly or fidgety/restless 0 0 0 0 -  Suicidal thoughts 0 0 0 0 -  PHQ-9 Score 2 0 1 5 -  Difficult doing work/chores Not difficult at all Not difficult at all Not difficult at all Not difficult at all -    phq 9 is positive   Fall Risk: Fall Risk  10/15/2018 08/18/2018 08/04/2018 06/16/2018 10/10/2017  Falls in the past year? 0 0 0 0 No  Number falls in past yr: 0 0 0 0 -  Injury with Fall? 0 0 0 0 -  Risk for fall due to : - - - - Impaired vision;Medication side effect  Risk for fall due to: Comment - - - - wears eyeglasses; Ambien  Follow up Falls prevention discussed - - - -  Assessment & Plan   1. Type 2 diabetes mellitus with microalbuminuria, without long-term current use of insulin (HCC)  - POCT HgB A1C - Urine Microalbumin w/creat. ratio  2. Essential hypertension  bp is at goal   3. Mild intermittent asthma without complication  Doing well at this time  4. Dysthymia  Secondary to COVID-19, also lost a close friend last week   5. Dyslipidemia  On statin therapy   6. Gastroesophageal reflux disease without esophagitis   7. History of acute  myocardial infarction   8. Arteriosclerosis of coronary artery  Continue statin therapy   9. Insomnia, persistent  - zolpidem (AMBIEN) 10 MG tablet; Take 1 tablet (10 mg total) by mouth at bedtime.  Dispense: 90 tablet; Refill: 0  10. Genital herpes in women  - valACYclovir (VALTREX) 500 MG tablet; Take 1 tablet (500 mg total) by mouth daily.  Dispense: 30 tablet; Refill: 1 - acyclovir ointment (ZOVIRAX) 5 %; Apply 1 application topically 2 (two) times daily as needed. Reported on 06/13/2015  Dispense: 30 g; Refill: 0

## 2018-10-15 NOTE — Progress Notes (Signed)
Subjective:   Dawn Werner is a 68 y.o. female who presents for Medicare Annual (Subsequent) preventive examination.  Review of Systems:   Cardiac Risk Factors include: advanced age (>85men, >16 women);diabetes mellitus;dyslipidemia;hypertension     Objective:     Vitals: BP 126/70 (BP Location: Right Arm, Patient Position: Sitting, Cuff Size: Normal)   Pulse 73   Temp 98.1 F (36.7 C) (Oral)   Resp 16   Ht  (1.575 m)   Wt 159 lb 14.4 oz (72.5 kg)   SpO2 95%   BMI 29.25 kg/m   Body mass index is 29.25 kg/m.  Advanced Directives 10/15/2018 10/10/2017 09/15/2017 04/04/2017 01/29/2017 10/10/2016 09/27/2016  Does Patient Have a Medical Advance Directive? No No No No No No No  Would patient like information on creating a medical advance directive? Yes (MAU/Ambulatory/Procedural Areas - Information given) Yes (MAU/Ambulatory/Procedural Areas - Information given) - - - - -    Tobacco Social History   Tobacco Use  Smoking Status Never Smoker  Smokeless Tobacco Never Used     Counseling given: Not Answered   Clinical Intake:  Pre-visit preparation completed: Yes  Pain : No/denies pain     BMI - recorded: 29.25 Nutritional Status: BMI 25 -29 Overweight Nutritional Risks: None Diabetes: Yes CBG done?: No Did pt. bring in CBG monitor from home?: No   Nutrition Risk Assessment:  Has the patient had any N/V/D within the last 2 months?  No  Does the patient have any non-healing wounds?  No  Has the patient had any unintentional weight loss or weight gain?  No   Diabetes:  Is the patient diabetic?  Yes  If diabetic, was a CBG obtained today?  No  Did the patient bring in their glucometer from home?  No  How often do you monitor your CBG's? Several times per week.   Financial Strains and Diabetes Management:  Are you having any financial strains with the device, your supplies or your medication? No .  Does the patient want to be seen by Chronic Care Management  for management of their diabetes?  No  Would the patient like to be referred to a Nutritionist or for Diabetic Management?  No   Diabetic Exams:  Diabetic Eye Exam: Completed 10/30/17 negative retinopathy.  Diabetic Foot Exam: Completed 02/10/18.  How often do you need to have someone help you when you read instructions, pamphlets, or other written materials from your doctor or pharmacy?: 1 - Never  Interpreter Needed?: No  Information entered by :: Reather Littler LPN  Past Medical History:  Diagnosis Date  . Anxiety   . Asthma   . Diabetes mellitus without complication (HCC)   . GERD (gastroesophageal reflux disease)   . Hyperlipidemia   . Insomnia    Past Surgical History:  Procedure Laterality Date  . ABDOMINAL HYSTERECTOMY    . BREAST SURGERY     breast reduction  . REDUCTION MAMMAPLASTY Bilateral 1995  . TUBAL LIGATION     Family History  Problem Relation Age of Onset  . Hyperlipidemia Mother   . Hypertension Mother   . Stroke Mother   . Dementia Father   . Cancer Sister        breast  . Breast cancer Sister 52  . Asthma Brother   . Depression Brother        bipolar  . Heart disease Brother   . Heart disease Brother    Social History   Socioeconomic History  .  Marital status: Married    Spouse name: [name]  . Number of children: 2  . Years of education: Not on file  . Highest education level: 12th grade  Occupational History  . 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 Financial resource strain: Not hard at all  . Food insecurity:    Worry: Never true    Inability: Never true  . Transportation needs:    Medical: No    Non-medical: No  Tobacco Use  . Smoking status: Never Smoker  . Smokeless tobacco: Never Used  Substance and Sexual Activity  . Alcohol use: No    Alcohol/week: 0.0 standard drinks  . Drug use: No  . Sexual activity: Yes    Partners: Male  Lifestyle  . Physical activity:    Days per week: 7 days    Minutes per  session: 30 min  . Stress: Not at all  Relationships  . Social connections:    Talks on phone: More than three times a week    Gets together: More than three times a week    Attends religious service: More than 4 times per year    Active member of club or organization: Yes    Attends meetings of clubs or organizations: More than 4 times per year    Relationship status: Married  Other Topics Concern  . Not on file  Social History Narrative  . Not on file    Outpatient Encounter Medications as of 10/15/2018  Medication Sig  . acyclovir ointment (ZOVIRAX) 5 % Apply 1 application topically 2 (two) times daily as needed. Reported on 06/13/2015  . albuterol (PROVENTIL HFA) 108 (90 BASE) MCG/ACT inhaler Inhale 2 puffs into the lungs as needed.  Marland Kitchen aspirin EC 81 MG tablet Take 1 tablet by mouth daily.  . carvedilol (COREG) 6.25 MG tablet Take 1 tablet (6.25 mg total) by mouth 2 (two) times daily.  Marland Kitchen ezetimibe (ZETIA) 10 MG tablet Take 1 tablet (10 mg total) by mouth daily.  . fluticasone furoate-vilanterol (BREO ELLIPTA) 100-25 MCG/INH AEPB Inhale 1 puff into the lungs daily. (Patient taking differently: Inhale 1 puff into the lungs daily. Takes PRN)  . losartan (COZAAR) 50 MG tablet Take 1 tablet (50 mg total) by mouth daily.  . metFORMIN (GLUCOPHAGE-XR) 500 MG 24 hr tablet Take 1 tablet (500 mg total) by mouth daily with breakfast.  . omeprazole (PRILOSEC) 40 MG capsule Take 1 capsule (40 mg total) by mouth daily.  . rosuvastatin (CRESTOR) 40 MG tablet TAKE 1 TABLET (40 MG TOTAL) BY MOUTH DAILY.  . Simethicone 125 MG TABS Take 1 tablet (125 mg total) by mouth 4 (four) times daily as needed (For Belching or Flatulence).  . valACYclovir (VALTREX) 500 MG tablet Take 1 tablet (500 mg total) by mouth daily.  Marland Kitchen zolpidem (AMBIEN) 10 MG tablet TAKE 1 TABLET BY MOUTH EVERYDAY AT BEDTIME  . colchicine 0.6 MG tablet Take 1 tablet (0.6 mg total) by mouth daily. Take two at onset of gout and repeat in 1 hour,  prn (Patient not taking: Reported on 10/15/2018)   No facility-administered encounter medications on file as of 10/15/2018.     Activities of Daily Living In your present state of health, do you have any difficulty performing the following activities: 10/15/2018 08/18/2018  Hearing? N N  Comment declines hearing aids -  Vision? N N  Comment wears glasses -  Difficulty concentrating or making decisions? N N  Walking or  climbing stairs? N N  Dressing or bathing? N N  Doing errands, shopping? N N  Preparing Food and eating ? N -  Using the Toilet? N -  In the past six months, have you accidently leaked urine? N -  Do you have problems with loss of bowel control? N -  Managing your Medications? N -  Managing your Finances? N -  Housekeeping or managing your Housekeeping? N -  Some recent data might be hidden    Patient Care Team: Alba CorySowles, Krichna, MD as PCP - General (Family Medicine)    Assessment:   This is a routine wellness examination for [place].  Exercise Activities and Dietary recommendations Current Exercise Habits: Home exercise routine, Time (Minutes): 30, Frequency (Times/Week): 7, Weekly Exercise (Minutes/Week): 210, Intensity: Mild, Exercise limited by: None identified  Goals    . DIET - INCREASE WATER INTAKE     Recommend to drink at least 6-8 8oz glasses of water per day.    . Increase water intake     Starting 06/24/16, I will increase my water intake to 4 glasses a day.    . maintain weight     Patient would like to maintain her weight to be less than 160#       Fall Risk Fall Risk  10/15/2018 08/18/2018 08/04/2018 06/16/2018 10/10/2017  Falls in the past year? 0 0 0 0 No  Number falls in past yr: 0 0 0 0 -  Injury with Fall? 0 0 0 0 -  Risk for fall due to : - - - - Impaired vision;Medication side effect  Risk for fall due to: Comment - - - - wears eyeglasses; Ambien  Follow up Falls prevention discussed - - - -   FALL RISK PREVENTION PERTAINING TO THE HOME:   Any stairs in or around the home? NO If so, do they handrails? No   Home free of loose throw rugs in walkways, pet beds, electrical cords, etc? Yes  Adequate lighting in your home to reduce risk of falls? Yes   ASSISTIVE DEVICES UTILIZED TO PREVENT FALLS:  Life alert? No  Use of a cane, walker or w/c? No  Grab bars in the bathroom? Yes  Shower chair or bench in shower? Yes  Elevated toilet seat or a handicapped toilet? Yes   DME ORDERS:  DME order needed?  No   TIMED UP AND GO:  Was the test performed? Yes .  Length of time to ambulate 10 feet: 5 sec.   GAIT:  Appearance of gait: Gait stead-fast and without the use of an assistive device.   Education: Fall risk prevention has been discussed.  Intervention(s) required? No   Depression Screen PHQ 2/9 Scores 10/15/2018 08/18/2018 08/04/2018 06/16/2018  PHQ - 2 Score 1 0 0 1  PHQ- 9 Score 2 0 1 5     Cognitive Function     6CIT Screen 10/15/2018 10/10/2017 06/24/2016  What Year? 0 points 0 points 0 points  What month? 0 points 0 points 0 points  What time? 0 points 0 points 0 points  Count back from 20 0 points 0 points 0 points  Months in reverse 0 points 0 points 0 points  Repeat phrase 4 points 0 points 2 points  Total Score 4 0 2    Immunization History  Administered Date(s) Administered  . Influenza, High Dose Seasonal PF 02/20/2015, 05/24/2016, 01/29/2017, 02/10/2018  . Pneumococcal Conjugate-13 01/10/2016  . Pneumococcal Polysaccharide-23 11/18/2014  . Td 01/29/2017  .  Tdap 04/10/2007  . Tetanus 04/10/2007    Qualifies for Shingles Vaccine? Yes . Due for Shingrix. Education has been provided regarding the importance of this vaccine. Pt has been advised to call insurance company to determine out of pocket expense. Advised may also receive vaccine at local pharmacy or Health Dept. Verbalized acceptance and understanding.  Tdap: Up to date  Flu Vaccine: Up to date  Pneumococcal Vaccine: Up to date  Screening  Tests Health Maintenance  Topic Date Due  . MAMMOGRAM  09/24/2018  . OPHTHALMOLOGY EXAM  10/31/2018  . HEMOGLOBIN A1C  12/15/2018  . INFLUENZA VACCINE  01/09/2019  . FOOT EXAM  02/11/2019  . COLONOSCOPY  06/19/2023  . TETANUS/TDAP  01/30/2027  . DEXA SCAN  Completed  . Hepatitis C Screening  Completed  . PNA vac Low Risk Adult  Completed    Cancer Screenings:  Colorectal Screening: Completed 10/16/13. Repeat every 10 years.  Mammogram: Completed 09/23/17. Repeat every year; Scheduled for 11/17/18.  Bone Density: Completed 2008. Results reflect NORMAL. Repeat every 2 years. Ordered today. Pt provided with contact information and advised to call to schedule appt.   Lung Cancer Screening: (Low Dose CT Chest recommended if Age 66-80 years, 30 pack-year currently smoking OR have quit w/in 15years.) does not qualify.   Additional Screening:  Hepatitis C Screening: does qualify; Completed 06/26/12  Vision Screening: Recommended annual ophthalmology exams for early detection of glaucoma and other disorders of the eye. Is the patient up to date with their annual eye exam?  Yes  Who is the provider or what is the name of the office in which the pt attends annual eye exams? [facility]   Dental Screening: Recommended annual dental exams for proper oral hygiene  Community Resource Referral:  CRR required this visit?  No      Plan:     I have personally reviewed and addressed the Medicare Annual Wellness questionnaire and have noted the following in the patient's chart:  A. Medical and social history B. Use of alcohol, tobacco or illicit drugs  C. Current medications and supplements D. Functional ability and status E.  Nutritional status F.  Physical activity G. Advance directives H. List of other physicians I.  Hospitalizations, surgeries, and ER visits in previous 12 months J.  Vitals K. Screenings such as hearing and vision if needed, cognitive and depression L.  Referrals and appointments   In addition, I have reviewed and discussed with patient certain preventive protocols, quality metrics, and best practice recommendations. A written personalized care plan for preventive services as well as general preventive health recommendations were provided to patient.   Signed,  Reather Littler, LPN Nurse Health Advisor   Nurse Notes: Pt doing well and appreciative of visit today. She would like a new meter to check her blood sugar. Pt seeing Dr. Carlynn Purl in office today.

## 2018-10-16 LAB — MICROALBUMIN / CREATININE URINE RATIO
Creatinine, Urine: 104 mg/dL (ref 20–275)
Microalb Creat Ratio: 222 mcg/mg creat — ABNORMAL HIGH (ref <30)
Microalb, Ur: 23.1 mg/dL

## 2018-11-10 ENCOUNTER — Other Ambulatory Visit: Payer: Self-pay

## 2018-11-10 NOTE — Patient Outreach (Signed)
[facility]) Care Management  11/10/2018  Koleen Nimrod A Piercey Nov 27, 1950 [id number]   Medication Adherence call to Dawn Werner Hippa Identifiers Verify spoke with patient she is due on Rosuvastatin 40 mg and Losartan 25 mg patient explain that [facility] has both medications ready for her and will pick up today. Mrs. Beaty is showing past due under [facility].   Lillia Abed CPhT Pharmacy Technician [facility] Management Direct Dial [phone]  Fax [phone] [email or web address]

## 2018-12-15 ENCOUNTER — Other Ambulatory Visit: Payer: Self-pay | Admitting: Family Medicine

## 2018-12-15 DIAGNOSIS — I10 Essential (primary) hypertension: Secondary | ICD-10-CM

## 2018-12-15 DIAGNOSIS — A6009 Herpesviral infection of other urogenital tract: Secondary | ICD-10-CM

## 2018-12-15 DIAGNOSIS — E1129 Type 2 diabetes mellitus with other diabetic kidney complication: Secondary | ICD-10-CM

## 2018-12-15 DIAGNOSIS — I252 Old myocardial infarction: Secondary | ICD-10-CM

## 2019-01-14 ENCOUNTER — Other Ambulatory Visit: Payer: Self-pay | Admitting: Family Medicine

## 2019-01-14 DIAGNOSIS — G4700 Insomnia, unspecified: Secondary | ICD-10-CM

## 2019-01-14 NOTE — Telephone Encounter (Signed)
Refill request for general medication. Ambien   Last office visit 10/15/2018   Follow up on 02/16/2019

## 2019-01-14 NOTE — Telephone Encounter (Signed)
Pt called to check the status of the refill request. Pt was advised by the pharmacy that the doctor office had not authorized request/ please advise

## 2019-01-15 ENCOUNTER — Other Ambulatory Visit: Payer: Self-pay | Admitting: Family Medicine

## 2019-01-15 DIAGNOSIS — G4700 Insomnia, unspecified: Secondary | ICD-10-CM

## 2019-02-02 ENCOUNTER — Other Ambulatory Visit: Payer: Self-pay | Admitting: Family Medicine

## 2019-02-02 DIAGNOSIS — I252 Old myocardial infarction: Secondary | ICD-10-CM

## 2019-02-02 DIAGNOSIS — I10 Essential (primary) hypertension: Secondary | ICD-10-CM

## 2019-02-02 DIAGNOSIS — R809 Proteinuria, unspecified: Secondary | ICD-10-CM

## 2019-02-02 DIAGNOSIS — E785 Hyperlipidemia, unspecified: Secondary | ICD-10-CM

## 2019-02-02 DIAGNOSIS — E1129 Type 2 diabetes mellitus with other diabetic kidney complication: Secondary | ICD-10-CM

## 2019-02-04 ENCOUNTER — Other Ambulatory Visit: Payer: Self-pay | Admitting: Family Medicine

## 2019-02-04 DIAGNOSIS — G4700 Insomnia, unspecified: Secondary | ICD-10-CM

## 2019-02-09 DIAGNOSIS — M75102 Unspecified rotator cuff tear or rupture of left shoulder, not specified as traumatic: Secondary | ICD-10-CM | POA: Diagnosis not present

## 2019-02-09 DIAGNOSIS — M25512 Pain in left shoulder: Secondary | ICD-10-CM | POA: Diagnosis not present

## 2019-02-09 DIAGNOSIS — M25511 Pain in right shoulder: Secondary | ICD-10-CM | POA: Diagnosis not present

## 2019-02-09 DIAGNOSIS — M75101 Unspecified rotator cuff tear or rupture of right shoulder, not specified as traumatic: Secondary | ICD-10-CM | POA: Diagnosis not present

## 2019-02-13 ENCOUNTER — Other Ambulatory Visit: Payer: Self-pay | Admitting: Family Medicine

## 2019-02-13 DIAGNOSIS — A6009 Herpesviral infection of other urogenital tract: Secondary | ICD-10-CM

## 2019-02-16 ENCOUNTER — Encounter: Payer: Self-pay | Admitting: Family Medicine

## 2019-02-16 ENCOUNTER — Other Ambulatory Visit: Payer: Self-pay

## 2019-02-16 ENCOUNTER — Ambulatory Visit (INDEPENDENT_AMBULATORY_CARE_PROVIDER_SITE_OTHER): Payer: Medicare Other | Admitting: Family Medicine

## 2019-02-16 VITALS — BP 118/76 | HR 74 | Temp 97.3°F | Resp 16 | Ht 62.0 in | Wt 152.0 lb

## 2019-02-16 DIAGNOSIS — K219 Gastro-esophageal reflux disease without esophagitis: Secondary | ICD-10-CM

## 2019-02-16 DIAGNOSIS — I10 Essential (primary) hypertension: Secondary | ICD-10-CM

## 2019-02-16 DIAGNOSIS — Z23 Encounter for immunization: Secondary | ICD-10-CM

## 2019-02-16 DIAGNOSIS — J4520 Mild intermittent asthma, uncomplicated: Secondary | ICD-10-CM

## 2019-02-16 DIAGNOSIS — I2510 Atherosclerotic heart disease of native coronary artery without angina pectoris: Secondary | ICD-10-CM | POA: Diagnosis not present

## 2019-02-16 DIAGNOSIS — G4700 Insomnia, unspecified: Secondary | ICD-10-CM | POA: Diagnosis not present

## 2019-02-16 DIAGNOSIS — E1129 Type 2 diabetes mellitus with other diabetic kidney complication: Secondary | ICD-10-CM | POA: Diagnosis not present

## 2019-02-16 DIAGNOSIS — R809 Proteinuria, unspecified: Secondary | ICD-10-CM | POA: Diagnosis not present

## 2019-02-16 DIAGNOSIS — E785 Hyperlipidemia, unspecified: Secondary | ICD-10-CM

## 2019-02-16 LAB — POCT GLYCOSYLATED HEMOGLOBIN (HGB A1C): HbA1c, POC (controlled diabetic range): 6.5 % (ref 0.0–7.0)

## 2019-02-16 MED ORDER — BLOOD GLUCOSE METER KIT: PACK | 0 refills | Status: DC

## 2019-02-16 MED ORDER — ZOLPIDEM TARTRATE 10 MG PO TABS: 10.0000 mg | ORAL_TABLET | Freq: Every day | ORAL | 0 refills | Status: DC

## 2019-02-16 NOTE — Patient Instructions (Addendum)
Weight:   Summer 2016 164 lbs Summer 2017 172 lbs Summer 2018 167 lbs Summer 2019 166 lbs Today 151 lbs

## 2019-02-16 NOTE — Progress Notes (Signed)
Name: Dawn Werner   MRN: [id number]    DOB: [birth year]   Date:02/16/2019       Progress Note  Subjective  Chief Complaint  Chief Complaint  Patient presents with  . Medication Refill  . Diabetes    Needs a new meter to check her sugar with  . Insomnia    Has been without Ambien and unable to sleep-dreads laying down  . Hypertension    Denies any symptoms  . Hyperlipidemia  . Asthma    Denies any symptoms  . Coronary Artery Disease    HPI  DMII: she has not been taking medication, Metformin ERsince it does not cause diarrhea. She has been exercising daily,still doing yard work . HerhgbA1C went down from 7.8% to 7.3%, up to 7.4%, 7.3%,6.9%, 6.8%,7.5%6.8% down &%, she is not baking as often and has been cutting down on desserts and A1C is down to 6.5% Shehas history of nephropathy and is on ARB, no neuropathy, eye exam is due and will try schedule it now. She denies polyphagia, polydipsia or polyuria. She lost 8 lbs since last visit , she states she is eating much healthier and that is why she is losing weight, we reviewed weight from the previous 4 years and usually around mid 160's, advised to get mammogram and monitor intake   Hyperlipidemia: she is back on Crestor 40 mgand Zetia 10 mg dailyand states no longer has muscle pain or cramps. No chest pain LDL was down to 71 .   Insomnia: she was trying to wean self off of Ambien, she has 10 mg tablets, and currently taking abite off her pills, but states Ambien 5 mg is more expensiveand not enough, she thinks she is taking about 7 mg she needs a refill today   HTN: No chest pain, palpitation or SOB ,taking medication as prescribed. She states bp at home is around 120's-130's/70's -80's  Denies orthostatic changes or dizziness.BP is at goal   CAD: no episodes, s/p history of MI in 2009, taking aspirin daily, beta-blocker and statin therapy. No chest pain, no decrease in exercise tolerance. She continues to be  asymptomatic   Asthma Mild Intermittent:: she has been doing well, no cough, wheezing or SOB lately , not take Breo, she still has a rescue inhaler at home  Atherosclerosis of aorta: on statin and aspirin, recheck labs once a year   Patient Active Problem List   Diagnosis Date Noted  . Belching 08/18/2018  . 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 Derangement of posterior horn of lateral meniscus of right knee 01/10/2016  . 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 Primary osteoarthritis of knee 01/24/2015  . Asthma, mild intermittent 11/18/2014  . Arteriosclerosis of coronary artery 11/18/2014  . Insomnia, persistent 11/18/2014  . Dyslipidemia 11/18/2014  . Acid reflux 11/18/2014  . Diabetes type 2, controlled (HCC) 11/18/2014  . Genital herpes in women 11/18/2014  . Hypertension 11/18/2014  . Asymptomatic postmenopausal status 11/18/2014  . History of acute myocardial infarction 11/18/2014    Past Surgical History:  Procedure Laterality Date  . ABDOMINAL HYSTERECTOMY    . BREAST SURGERY     breast reduction  . REDUCTION MAMMAPLASTY Bilateral 1995  . TUBAL LIGATION      Family History  Problem Relation Age of Onset  . Hyperlipidemia Mother   . Hypertension Mother   . Stroke Mother   . Dementia Father   . Cancer Sister        breast  . Breast cancer  Sister [id number]  . Asthma Brother   . Depression Brother        bipolar  . Heart disease Brother   . Heart disease Brother     Social History   Socioeconomic History  . Marital status: Married    Spouse name: [name]  . Number of children: 2  . Years of education: Not on file  . Highest education level: 12th grade  Occupational History  . 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 Financial resource strain: Not hard at all  . Food insecurity    Worry: Never true    Inability: Never true  . Transportation needs    Medical: No    Non-medical: No  Tobacco Use  . Smoking status: Never Smoker  . Smokeless tobacco:  Never Used  Substance and Sexual Activity  . Alcohol use: No    Alcohol/week: 0.0 standard drinks  . Drug use: No  . Sexual activity: Yes    Partners: Male  Lifestyle  . Physical activity    Days per week: 7 days    Minutes per session: 30 min  . Stress: Not at all  Relationships  . Social connections    Talks on phone: More than three times a week    Gets together: More than three times a week    Attends religious service: More than 4 times per year    Active member of club or organization: Yes    Attends meetings of clubs or organizations: More than 4 times per year    Relationship status: Married  . Intimate partner violence    Fear of current or ex partner: No    Emotionally abused: No    Physically abused: No    Forced sexual activity: No  Other Topics Concern  . Not on file  Social History Narrative  . Not on file    Current Outpatient Medications:  .  acyclovir ointment (ZOVIRAX) 5 %, Apply 1 application topically 2 (two) times daily as needed. Reported on 06/13/2015, Disp: 30 g, Rfl: 0 .  albuterol (PROVENTIL HFA) 108 (90 BASE) MCG/ACT inhaler, Inhale 2 puffs into the lungs as needed., Disp: , Rfl:  .  aspirin EC 81 MG tablet, Take 1 tablet by mouth daily., Disp: , Rfl:  .  carvedilol (COREG) 6.25 MG tablet, TAKE 1 TABLET BY MOUTH TWICE A DAY, Disp: 180 tablet, Rfl: 1 .  ezetimibe (ZETIA) 10 MG tablet, TAKE 1 TABLET BY MOUTH EVERY DAY, Disp: 90 tablet, Rfl: 1 .  losartan (COZAAR) 25 MG tablet, TAKE 2 TABLET BY MOUTH EVERY DAY, Disp: 180 tablet, Rfl: 1 .  metFORMIN (GLUCOPHAGE-XR) 500 MG 24 hr tablet, TAKE 1 TABLET BY MOUTH EVERY DAY WITH BREAKFAST, Disp: 90 tablet, Rfl: 1 .  omeprazole (PRILOSEC) 40 MG capsule, Take 1 capsule (40 mg total) by mouth daily., Disp: 30 capsule, Rfl: 3 .  rosuvastatin (CRESTOR) 40 MG tablet, TAKE 1 TABLET BY MOUTH EVERY DAY, Disp: 90 tablet, Rfl: 1 .  Simethicone 125 MG TABS, Take 1 tablet (125 mg total) by mouth 4 (four) times daily as  needed (For Belching or Flatulence)., Disp: 120 tablet, Rfl: 0 .  valACYclovir (VALTREX) 500 MG tablet, TAKE 1 TABLET BY MOUTH EVERY DAY, Disp: 90 tablet, Rfl: 1 .  zolpidem (AMBIEN) 10 MG tablet, Take 1 tablet (10 mg total) by mouth at bedtime., Disp: 90 tablet, Rfl: 0 .  colchicine 0.6 MG tablet, Take 1 tablet (0.6  mg total) by mouth daily. Take two at onset of gout and repeat in 1 hour, prn (Patient not taking: Reported on 02/16/2019), Disp: 30 tablet, Rfl: 0    Allergies  Allergen Reactions  . Metformin And Related Diarrhea    And nausea. Tolerates XR formulation    I personally reviewed active problem list, medication list, allergies, family history, social history, health maintenance with the patient/caregiver today.   ROS  Constitutional: Negative for fever, positive for  weight change.  Respiratory: Negative for cough and shortness of breath.   Cardiovascular: Negative for chest pain or palpitations.  Gastrointestinal: Negative for abdominal pain, no bowel changes.  Musculoskeletal: Negative for gait problem or joint swelling.  Skin: Negative for rash.  Neurological: Negative for dizziness or headache.  No other specific complaints in a complete review of systems (except as listed in HPI above).  Objective  Vitals:   02/16/19 0925  BP: 118/76  Pulse: 74  Resp: 16  Temp: (!) 97.3 F (36.3 C)  TempSrc: Temporal  SpO2: 98%  Weight: 151 lb 15.7 oz (68.9 kg)  Height: 5\' 2"  (1.575 m)    Body mass index is 27.8 kg/m.  Physical Exam  Constitutional: Patient appears well-developed and well-nourished. Overweight. No distress.  HEENT: head atraumatic, normocephalic, pupils equal and reactive to light Cardiovascular: Normal rate, regular rhythm and normal heart sounds.  No murmur heard. No BLE edema. Pulmonary/Chest: Effort normal and breath sounds normal. No respiratory distress. Abdominal: Soft.  There is no tenderness. Psychiatric: Patient has a normal mood and  affect. behavior is normal. Judgment and thought content normal.  Recent Results (from the past 2160 hour(s))  POCT HgB A1C     Status: Normal   Collection Time: 02/16/19  9:32 AM  Result Value Ref Range   Hemoglobin A1C     HbA1c POC (<> result, manual entry)     HbA1c, POC (prediabetic range)     HbA1c, POC (controlled diabetic range) 6.5 0.0 - 7.0 %    Diabetic Foot Exam: Diabetic Foot Exam - Simple   Simple Foot Form Diabetic Foot exam was performed with the following findings: Yes 02/16/2019 10:13 AM  Visual Inspection See comments: Yes Sensation Testing Intact to touch and monofilament testing bilaterally: Yes Pulse Check Posterior Tibialis and Dorsalis pulse intact bilaterally: Yes Comments Dry feet with some callus formation       PHQ2/9: Depression screen [facility] 2/9 02/16/2019 10/15/2018 08/18/2018 08/04/2018 06/16/2018  Decreased Interest 0 0 0 0 0  Down, Depressed, Hopeless 0 1 0 0 1  PHQ - 2 Score 0 1 0 0 1  Altered sleeping 3 1 0 1 2  Tired, decreased energy 0 0 0 0 1  Change in appetite 0 0 0 0 0  Feeling bad or failure about yourself  0 0 0 0 1  Trouble concentrating 0 0 0 0 0  Moving slowly or fidgety/restless 0 0 0 0 0  Suicidal thoughts 0 0 0 0 0  PHQ-9 Score 3 2 0 1 5  Difficult doing work/chores Not difficult at all Not difficult at all Not difficult at all Not difficult at all Not difficult at all  Some recent data might be hidden    phq 9 is positive and negative   Fall Risk: Fall Risk  02/16/2019 10/15/2018 08/18/2018 08/04/2018 06/16/2018  Falls in the past year? 0 0 0 0 0  Number falls in past yr: 0 0 0 0 0  Injury with Fall? 0  0 0 0 0  Risk for fall due to : - - - - -  Risk for fall due to: Comment - - - - -  Follow up - Falls prevention discussed - - -     Functional Status Survey: Is the patient deaf or have difficulty hearing?: No Does the patient have difficulty seeing, even when wearing glasses/contacts?: Yes Does the patient have difficulty  concentrating, remembering, or making decisions?: No Does the patient have difficulty walking or climbing stairs?: No Does the patient have difficulty dressing or bathing?: No Does the patient have difficulty doing errands alone such as visiting a doctor's office or shopping?: No    Assessment & Plan   1. Type 2 diabetes mellitus with microalbuminuria, without long-term current use of insulin (HCC)  - POCT HgB A1C  2. Need for immunization against influenza  - Flu Vaccine QUAD High Dose(Fluad)  3. Insomnia, persistent  - zolpidem (AMBIEN) 10 MG tablet; Take 1 tablet (10 mg total) by mouth at bedtime.  Dispense: 90 tablet; Refill: 0  4. Arteriosclerosis of coronary artery   5. Essential hypertension  bp is at goal   6. Dyslipidemia   7. Mild intermittent asthma without complication  Controlled   8. Gastroesophageal reflux disease without esophagitis

## 2019-02-16 NOTE — Addendum Note (Signed)
Addended by: Chilton Greathouse on: 02/16/2019 10:18 AM   Modules accepted: Orders

## 2019-03-02 ENCOUNTER — Other Ambulatory Visit: Payer: Self-pay

## 2019-03-02 NOTE — Patient Outreach (Signed)
[facility]) Care Management  03/02/2019  Dawn Werner 03-13-1951 [id number]   Medication Adherence call to Dawn Werner HIPPA Compliant Voice message left with a call back number. Dawn Werner is showing past due on Losartan 25 mg under [place].   [place] Management Direct Dial [phone]  Fax [phone] Kiernan [email or web address]

## 2019-03-26 ENCOUNTER — Ambulatory Visit
Admission: RE | Admit: 2019-03-26 | Discharge: 2019-03-26 | Disposition: A | Discharge status: Home / Self Care | Payer: Medicare Other | Source: Ambulatory Visit | Attending: Family Medicine | Admitting: Family Medicine

## 2019-03-26 DIAGNOSIS — Z1231 Encounter for screening mammogram for malignant neoplasm of breast: Secondary | ICD-10-CM | POA: Insufficient documentation

## 2019-04-24 ENCOUNTER — Encounter: Payer: Self-pay | Admitting: Emergency Medicine

## 2019-04-24 ENCOUNTER — Ambulatory Visit
Admission: EM | Admit: 2019-04-24 | Discharge: 2019-04-24 | Disposition: A | Discharge status: Home / Self Care | Payer: Medicare Other | Attending: Emergency Medicine | Admitting: Emergency Medicine

## 2019-04-24 ENCOUNTER — Other Ambulatory Visit: Payer: Self-pay

## 2019-04-24 DIAGNOSIS — N390 Urinary tract infection, site not specified: Secondary | ICD-10-CM | POA: Diagnosis not present

## 2019-04-24 LAB — URINALYSIS, COMPLETE (UACMP) WITH MICROSCOPIC
Bilirubin Urine: NEGATIVE
Glucose, UA: NEGATIVE mg/dL
Ketones, ur: NEGATIVE mg/dL
Nitrite: POSITIVE — AB
Protein, ur: 100 mg/dL — AB
Specific Gravity, Urine: 1.025 (ref 1.005–1.030)
WBC, UA: >50 WBC/hpf (ref 0–5)
pH: 5.5 (ref 5.0–8.0)

## 2019-04-24 MED ORDER — PHENAZOPYRIDINE HCL 200 MG PO TABS: 200.0000 mg | ORAL_TABLET | Freq: Three times a day (TID) | ORAL | 0 refills | Status: DC

## 2019-04-24 MED ORDER — CEPHALEXIN 500 MG PO CAPS: 500.0000 mg | ORAL_CAPSULE | Freq: Two times a day (BID) | ORAL | 0 refills | Status: DC

## 2019-04-24 NOTE — Discharge Instructions (Signed)
Drink plenty of fluids.  If you develop fevers nausea or vomiting or worsening back pain return to our clinic or go to the emergency room

## 2019-04-24 NOTE — ED Triage Notes (Signed)
Paient c/o left side lower back pain and burning when urinating that started 2 days ago. Patient denies blood in her urine.  Patient denies fevers.

## 2019-04-24 NOTE — ED Provider Notes (Signed)
[facility]    CSN: [id number] Arrival date & time: 04/24/19  1042      History   Chief Complaint Chief Complaint  Patient presents with  . Back Pain  . Dysuria    HPI Dawn Werner is a 68 y.o. female.   HPI  68 year old female presents with dysuria described as burning frequency and urgency.  She also has been having left-sided lower back pain.  She denies any fever or chills, nausea or vomiting, or vaginal discharge.  She states it feels as if she has a UTI which she has had in the past.       Past Medical History:  Diagnosis Date  . Anxiety   . Asthma   . Diabetes mellitus without complication ([place])   . GERD (gastroesophageal reflux disease)   . Hyperlipidemia   . Insomnia     Patient Active Problem List   Diagnosis Date Noted  . Belching 08/18/2018  . 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 Derangement of posterior horn of lateral meniscus of right knee 01/10/2016  . 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 Primary osteoarthritis of knee 01/24/2015  . Asthma, mild intermittent 11/18/2014  . Arteriosclerosis of coronary artery 11/18/2014  . Insomnia, persistent 11/18/2014  . Dyslipidemia 11/18/2014  . Acid reflux 11/18/2014  . Diabetes type 2, controlled ([place]) 11/18/2014  . Genital herpes in women 11/18/2014  . Hypertension 11/18/2014  . Asymptomatic postmenopausal status 11/18/2014  . History of acute myocardial infarction 11/18/2014    Past Surgical History:  Procedure Laterality Date  . ABDOMINAL HYSTERECTOMY    . BREAST SURGERY     breast reduction  . REDUCTION MAMMAPLASTY Bilateral 1995  . TUBAL LIGATION      OB History   No obstetric history on file.      Home Medications    Prior to Admission medications   Medication Sig Start Date End Date Taking? Authorizing Provider  aspirin EC 81 MG tablet Take 1 tablet by mouth daily.   Yes [provider]  carvedilol (COREG) 6.25 MG tablet TAKE 1 TABLET BY MOUTH TWICE A DAY  12/15/18  Yes Sowles, Drue Stager, MD  colchicine 0.6 MG tablet Take 1 tablet (0.6 mg total) by mouth daily. Take two at onset of gout and repeat in 1 hour, prn 01/24/15  Yes Sowles, Drue Stager, MD  ezetimibe (ZETIA) 10 MG tablet TAKE 1 TABLET BY MOUTH EVERY DAY 12/15/18  Yes Sowles, Drue Stager, MD  losartan (COZAAR) 25 MG tablet TAKE 2 TABLET BY MOUTH EVERY DAY 02/02/19  Yes Sowles, Drue Stager, MD  metFORMIN (GLUCOPHAGE-XR) 500 MG 24 hr tablet TAKE 1 TABLET BY MOUTH EVERY DAY WITH BREAKFAST 12/15/18  Yes Sowles, Drue Stager, MD  omeprazole (PRILOSEC) 40 MG capsule Take 1 capsule (40 mg total) by mouth daily. 08/04/18  Yes Hubbard Hartshorn, FNP  rosuvastatin (CRESTOR) 40 MG tablet TAKE 1 TABLET BY MOUTH EVERY DAY 02/02/19  Yes Sowles, Drue Stager, MD  zolpidem (AMBIEN) 10 MG tablet Take 1 tablet (10 mg total) by mouth at bedtime. 02/16/19  Yes Sowles, Drue Stager, MD  acyclovir ointment (ZOVIRAX) 5 % Apply 1 application topically 2 (two) times daily as needed. Reported on 06/13/2015 10/15/18   Steele Sizer, MD  albuterol (PROVENTIL HFA) 108 (90 BASE) MCG/ACT inhaler Inhale 2 puffs into the lungs as needed.    [provider]  blood glucose meter kit and supplies Dispense based on patient and insurance preference. Use up to four times daily as directed. (FOR ICD-10 E10.9, E11.9).  02/16/19   Steele Sizer, MD  cephALEXin (KEFLEX) 500 MG capsule Take 1 capsule (500 mg total) by mouth 2 (two) times daily. 04/24/19   Lorin Picket, PA-C  phenazopyridine (PYRIDIUM) 200 MG tablet Take 1 tablet (200 mg total) by mouth 3 (three) times daily. 04/24/19   Lorin Picket, PA-C  Simethicone 125 MG TABS Take 1 tablet (125 mg total) by mouth 4 (four) times daily as needed (For Belching or Flatulence). 08/18/18   Hubbard Hartshorn, FNP  valACYclovir (VALTREX) 500 MG tablet TAKE 1 TABLET BY MOUTH EVERY DAY 02/13/19   Steele Sizer, MD    Family History Family History  Problem Relation Age of Onset  . Hyperlipidemia Mother   . Hypertension  Mother   . Stroke Mother   . Dementia Father   . Cancer Sister        breast  . Breast cancer Sister 29  . Asthma Brother   . Depression Brother        bipolar  . Heart disease Brother   . Heart disease Brother     Social History Social History   Tobacco Use  . Smoking status: Never Smoker  . Smokeless tobacco: Never Used  Substance Use Topics  . Alcohol use: No    Alcohol/week: 0.0 standard drinks  . Drug use: No     Allergies   Metformin and related   Review of Systems Review of Systems  Constitutional: Positive for activity change. Negative for appetite change, chills, fatigue and fever.  Genitourinary: Positive for dysuria, frequency and urgency. Negative for hematuria, vaginal discharge and vaginal pain.  All other systems reviewed and are negative.    Physical Exam Triage Vital Signs ED Triage Vitals  Enc Vitals Group     BP 04/24/19 1104 (!) 162/74     Pulse Rate 04/24/19 1104 70     Resp 04/24/19 1104 14     Temp 04/24/19 1104 98.4 F (36.9 C)     Temp Source 04/24/19 1104 Oral     SpO2 04/24/19 1104 100 %     Weight 04/24/19 1101 150 lb (68 kg)     Height 04/24/19 1101 5' 3"  (1.6 m)     Head Circumference --      Peak Flow --      Pain Score 04/24/19 1101 5     Pain Loc --      Pain Edu? --      Excl. in [place]? --    No data found.  Updated Vital Signs BP (!) 162/74 (BP Location: Left Arm)   Pulse 70   Temp 98.4 F (36.9 C) (Oral)   Resp 14   Ht 5' 3"  (1.6 m)   Wt 150 lb (68 kg)   SpO2 100%   BMI 26.57 kg/m   Visual Acuity Right Eye Distance:   Left Eye Distance:   Bilateral Distance:    Right Eye Near:   Left Eye Near:    Bilateral Near:     Physical Exam Vitals signs and nursing note reviewed.  Constitutional:      General: She is not in acute distress.    Appearance: Normal appearance. She is not ill-appearing, toxic-appearing or diaphoretic.  HENT:     Head: Normocephalic.     Nose: Nose normal.     Mouth/Throat:      Mouth: Mucous membranes are moist.  Eyes:     Conjunctiva/sclera: Conjunctivae normal.  Neck:     Musculoskeletal:  Normal range of motion and neck supple.  Cardiovascular:     Rate and Rhythm: Normal rate and regular rhythm.     Heart sounds: Normal heart sounds.  Pulmonary:     Effort: Pulmonary effort is normal.     Breath sounds: Normal breath sounds.  Abdominal:     General: Abdomen is flat. Bowel sounds are normal. There is no distension.     Palpations: Abdomen is soft.     Tenderness: There is no abdominal tenderness. There is left CVA tenderness. There is no right CVA tenderness, guarding or rebound.  Musculoskeletal: Normal range of motion.  Skin:    General: Skin is warm and dry.  Neurological:     General: No focal deficit present.     Mental Status: She is alert and oriented to person, place, and time.  Psychiatric:        Mood and Affect: Mood normal.        Behavior: Behavior normal.        Thought Content: Thought content normal.        Judgment: Judgment normal.      UC Treatments / Results  Labs (all labs ordered are listed, but only abnormal results are displayed) Labs Reviewed  URINALYSIS, COMPLETE (UACMP) WITH MICROSCOPIC - Abnormal; Notable for the following components:      Result Value   APPearance CLOUDY (*)    Hgb urine dipstick MODERATE (*)    Protein, ur 100 (*)    Nitrite POSITIVE (*)    Leukocytes,Ua SMALL (*)    Bacteria, UA MANY (*)    All other components within normal limits  URINE CULTURE    EKG   Radiology No results found.  Procedures Procedures (including critical care time)  Medications Ordered in UC Medications - No data to display  Initial Impression / Assessment and Plan / UC Course  I have reviewed the triage vital signs and the nursing notes.  Pertinent labs & imaging results that were available during my care of the patient were reviewed by me and considered in my medical decision making (see chart for details).    Is a 69 year old female presents with a day history of dysuria described as burning frequency and urgency.  She also has left-sided lower back pain.  She has had no fever or chills nausea vomiting or vaginal discharge.  Urinalysis was consistent with a UTI.  I will culture her urine.  I will place her on Keflex 500 mg twice daily for 5 days.  To provide her with Pyridium for pain control of her dysuria.  If she develops any nausea vomiting fever chills or worsening of symptoms she will return to our clinic or go to the emergency room.   Final Clinical Impressions(s) / UC Diagnoses   Final diagnoses:  Lower urinary tract infectious disease     Discharge Instructions     Drink plenty of fluids.  If you develop fevers nausea or vomiting or worsening back pain return to our clinic or go to the emergency room    ED Prescriptions    Medication Sig Dispense Auth. Provider   cephALEXin (KEFLEX) 500 MG capsule Take 1 capsule (500 mg total) by mouth 2 (two) times daily. 10 capsule Crecencio Mc P, PA-C   phenazopyridine (PYRIDIUM) 200 MG tablet Take 1 tablet (200 mg total) by mouth 3 (three) times daily. 6 tablet Lorin Picket, PA-C     PDMP not reviewed this encounter.   Crecencio Mc  P, PA-C 04/24/19 1218

## 2019-04-27 LAB — URINE CULTURE: Culture: >=100000 — AB

## 2019-04-30 ENCOUNTER — Ambulatory Visit (INDEPENDENT_AMBULATORY_CARE_PROVIDER_SITE_OTHER): Payer: Medicare Other | Admitting: Family Medicine

## 2019-04-30 ENCOUNTER — Other Ambulatory Visit: Payer: Self-pay

## 2019-04-30 ENCOUNTER — Encounter: Payer: Self-pay | Admitting: Family Medicine

## 2019-04-30 DIAGNOSIS — N3001 Acute cystitis with hematuria: Secondary | ICD-10-CM | POA: Diagnosis not present

## 2019-04-30 MED ORDER — CEPHALEXIN 500 MG PO CAPS: 500.0000 mg | ORAL_CAPSULE | Freq: Four times a day (QID) | ORAL | 0 refills | Status: AC

## 2019-04-30 NOTE — Progress Notes (Signed)
Name: Dawn Werner   MRN: [id number]    DOB: 28-Jun-1950   Date:04/30/2019       Progress Note  Subjective:    Chief Complaint  Chief Complaint  Patient presents with   Urinary Tract Infection    Went to Urgent Care on 04/24/2019 and was given Keflex- still having tummy ache and pressure sensation when she goes to the bathroom   Dysuria    still having the burning sensation    I connected with  [place] on 04/30/19 at  3:20 PM EST by telephone and verified that I am speaking with the correct person using two identifiers.   I discussed the limitations, risks, security and privacy concerns of performing an evaluation and management service by telephone and the availability of in person appointments. Staff also discussed with the patient that there may be a patient responsible charge related to this service. Patient Location: home Provider Location: [facility] Additional Individuals present: none.  HPI Patient had a UTI about 6 days ago went to [facility] urgent care had a urine dip and culture done she was given Keflex to take BID x 5 days, and she feels like it did help, but just not all the way.  She was having urinary frequency urgency low pelvic pressure and pain was of hematuria.  Sx felt almost completely improved about two days ago, but today had worsening urinary sx again this morning.  She endorses urinary urgency, bladder pressure that began again today and possibly some faint appearance of blood at the end of her urine stream.  Mostly she is worried about having worsening symptoms over the weekend and having to go to urgent care again.  She would like to make sure that it does not get worse.  She is continuing to drink a lot of water she is not taking any other medications prescribed over-the-counter for UTI symptoms or dysuria.  She denies any vaginal or genital symptoms.  She does have a history of genital herpes but it does not feel like that at all.  No itching  burning.  Currently no fever chills sweats nausea vomiting flank pain    Patient Active Problem List   Diagnosis Date Noted   Belching 08/18/2018   Chondromalacia of right knee 01/10/2016   Derangement of posterior horn of lateral meniscus of right knee 01/10/2016   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   Primary osteoarthritis of knee 01/24/2015   Asthma, mild intermittent 11/18/2014   Arteriosclerosis of coronary artery 11/18/2014   Insomnia, persistent 11/18/2014   Dyslipidemia 11/18/2014   Acid reflux 11/18/2014   Diabetes type 2, controlled ([place]) 11/18/2014   Genital herpes in women 11/18/2014   Hypertension 11/18/2014   Asymptomatic postmenopausal status 11/18/2014   History of acute myocardial infarction 11/18/2014    Social History   Tobacco Use   Smoking status: Never Smoker   Smokeless tobacco: Never Used  Substance Use Topics   Alcohol use: No    Alcohol/week: 0.0 standard drinks     Current Outpatient Medications:    acyclovir ointment (ZOVIRAX) 5 %, Apply 1 application topically 2 (two) times daily as needed. Reported on 06/13/2015, Disp: 30 g, Rfl: 0   albuterol (PROVENTIL HFA) 108 (90 BASE) MCG/ACT inhaler, Inhale 2 puffs into the lungs as needed., Disp: , Rfl:    aspirin EC 81 MG tablet, Take 1 tablet by mouth daily., Disp: , Rfl:    blood glucose meter kit and supplies, Dispense based on patient and  insurance preference. Use up to four times daily as directed. (FOR ICD-10 E10.9, E11.9)., Disp: 1 each, Rfl: 0   carvedilol (COREG) 6.25 MG tablet, TAKE 1 TABLET BY MOUTH TWICE A DAY, Disp: 180 tablet, Rfl: 1   colchicine 0.6 MG tablet, Take 1 tablet (0.6 mg total) by mouth daily. Take two at onset of gout and repeat in 1 hour, prn, Disp: 30 tablet, Rfl: 0   ezetimibe (ZETIA) 10 MG tablet, TAKE 1 TABLET BY MOUTH EVERY DAY, Disp: 90 tablet, Rfl: 1   losartan (COZAAR) 25 MG tablet, TAKE 2 TABLET BY MOUTH EVERY DAY, Disp: 180 tablet, Rfl: 1   metFORMIN  (GLUCOPHAGE-XR) 500 MG 24 hr tablet, TAKE 1 TABLET BY MOUTH EVERY DAY WITH BREAKFAST, Disp: 90 tablet, Rfl: 1   omeprazole (PRILOSEC) 40 MG capsule, Take 1 capsule (40 mg total) by mouth daily., Disp: 30 capsule, Rfl: 3   rosuvastatin (CRESTOR) 40 MG tablet, TAKE 1 TABLET BY MOUTH EVERY DAY, Disp: 90 tablet, Rfl: 1   Simethicone 125 MG TABS, Take 1 tablet (125 mg total) by mouth 4 (four) times daily as needed (For Belching or Flatulence)., Disp: 120 tablet, Rfl: 0   valACYclovir (VALTREX) 500 MG tablet, TAKE 1 TABLET BY MOUTH EVERY DAY, Disp: 90 tablet, Rfl: 1   zolpidem (AMBIEN) 10 MG tablet, Take 1 tablet (10 mg total) by mouth at bedtime., Disp: 90 tablet, Rfl: 0   cephALEXin (KEFLEX) 500 MG capsule, Take 1 capsule (500 mg total) by mouth 2 (two) times daily. (Patient not taking: Reported on 04/30/2019), Disp: 10 capsule, Rfl: 0   phenazopyridine (PYRIDIUM) 200 MG tablet, Take 1 tablet (200 mg total) by mouth 3 (three) times daily. (Patient not taking: Reported on 04/30/2019), Disp: 6 tablet, Rfl: 0  Allergies  Allergen Reactions   Metformin And Related Diarrhea    And nausea. Tolerates XR formulation    I personally reviewed active problem list, medication list, allergies, family history, social history, health maintenance, notes from last encounter, lab results, imaging with the patient/caregiver today.  Review of Systems  Constitutional: Negative.  Negative for activity change, appetite change, chills, diaphoresis, fatigue, fever and unexpected weight change.  HENT: Negative.   Eyes: Negative.   Respiratory: Negative.   Cardiovascular: Negative.   Gastrointestinal: Negative.   Endocrine: Negative.   Genitourinary: Negative.   Musculoskeletal: Negative.   Skin: Negative.   Allergic/Immunologic: Negative.   Neurological: Negative.  Negative for dizziness, weakness, light-headedness and headaches.  Hematological: Negative.   Psychiatric/Behavioral: Negative.  Negative for  confusion.  All other systems reviewed and are negative.    Objective:    Virtual encounter, vitals limited, only able to obtain the following Today's Vitals   04/30/19 1536  PainSc: 6    There is no height or weight on file to calculate BMI. Nursing Note and Vital Signs reviewed.  Physical Exam Patient phonation clear, alert answering questions appropriately, no auditory wheeze or stridor, normal mood PE limited by telephone encounter  No results found for this or any previous visit (from the past 72 hour(s)).  Assessment and Plan:     ICD-10-CM   1. Acute cystitis with hematuria  N30.01    Possibly not completely resolved UTI, urine culture from 6 days ago was pertinent for pansensitive E. coli, took Keflex twice daily x5 days, possibly underdosed?  Discussed options with patient to try a second course of a different antibiotic or a few more days of Keflex 4 times daily, and she has  asked to do Keflex for a few more days.  I encouraged close follow-up if still having symptoms next week she will need to come in to do UA and urine culture    -Red flags and when to present for emergency care or RTC including fever >101.47F, chest pain, shortness of breath, new/worsening/un-resolving symptoms,  reviewed with patient at time of visit. Follow up and care instructions discussed and provided in AVS. - I discussed the assessment and treatment plan with the patient. The patient was provided an opportunity to ask questions and all were answered. The patient agreed with the plan and demonstrated an understanding of the instructions.  - The patient was advised to call back or seek an in-person evaluation if the symptoms worsen or if the condition fails to improve as anticipated.  I provided 11 minutes of non-face-to-face time during this encounter.  Delsa Grana, PA-C 04/30/19 3:50 PM

## 2019-05-05 ENCOUNTER — Other Ambulatory Visit: Payer: Self-pay

## 2019-05-05 NOTE — Patient Outreach (Signed)
[facility]) Care Management  05/05/2019  Dawn Werner March 11, 1951 [id number]   Medication Adherence call to Dawn Werner [place] Identifiers Verify spoke with patient she is past due on Metformin Er 500 mg,patient explain she takes 1 tablet daily,patient explain at some point she end up with extras,patient has medication at this time and will order when due.Dawn Werner is showing past due under [place].   [place] Management Direct Dial [phone]  Fax [phone] [email or web address]

## 2019-05-27 ENCOUNTER — Other Ambulatory Visit: Payer: Self-pay | Admitting: Family Medicine

## 2019-05-27 DIAGNOSIS — G4700 Insomnia, unspecified: Secondary | ICD-10-CM

## 2019-05-27 DIAGNOSIS — E119 Type 2 diabetes mellitus without complications: Secondary | ICD-10-CM | POA: Diagnosis not present

## 2019-05-27 DIAGNOSIS — Z0100 Encounter for examination of eyes and vision without abnormal findings: Secondary | ICD-10-CM | POA: Diagnosis not present

## 2019-05-27 NOTE — Telephone Encounter (Signed)
Requested medication (s) are due for refill today: yes  Requested medication (s) are on the active medication list: yes  Last refill:  02/16/2019  Future visit scheduled: yes  Notes to clinic:  not delegated    Requested Prescriptions  Pending Prescriptions Disp Refills   zolpidem (AMBIEN) 10 MG tablet [Pharmacy Med Name: ZOLPIDEM TARTRATE 10 MG TABLET] 90 tablet 0    Sig: TAKE 1 TABLET BY MOUTH EVERYDAY AT BEDTIME      Not Delegated - Psychiatry:  Anxiolytics/Hypnotics Failed - 05/27/2019 10:53 AM      Failed - This refill cannot be delegated      Failed - Urine Drug Screen completed in last 360 days.      Passed - Valid encounter within last 6 months    Recent Outpatient Visits           3 weeks ago Acute cystitis with hematuria   [facility] Delsa Grana, PA-C   3 months ago Type 2 diabetes mellitus with microalbuminuria, without long-term current use of insulin [facility])   [facility] Steele Sizer, MD   7 months ago Type 2 diabetes mellitus with microalbuminuria, without long-term current use of insulin [facility])   [facility] Steele Sizer, MD   7 months ago Erroneous encounter - disregard   [facility] Hubbard Hartshorn, FNP   9 months ago [place], [place]       Future Appointments             In 3 weeks Steele Sizer, MD [facility] [place]   In 4 months  [facility], [facility]

## 2019-06-18 ENCOUNTER — Encounter: Payer: Self-pay | Admitting: Family Medicine

## 2019-06-18 ENCOUNTER — Other Ambulatory Visit: Payer: Self-pay

## 2019-06-18 ENCOUNTER — Other Ambulatory Visit: Payer: Self-pay | Admitting: Family Medicine

## 2019-06-18 ENCOUNTER — Ambulatory Visit (INDEPENDENT_AMBULATORY_CARE_PROVIDER_SITE_OTHER): Payer: Medicare Other | Admitting: Family Medicine

## 2019-06-18 DIAGNOSIS — R809 Proteinuria, unspecified: Secondary | ICD-10-CM

## 2019-06-18 DIAGNOSIS — E785 Hyperlipidemia, unspecified: Secondary | ICD-10-CM | POA: Diagnosis not present

## 2019-06-18 DIAGNOSIS — J4520 Mild intermittent asthma, uncomplicated: Secondary | ICD-10-CM

## 2019-06-18 DIAGNOSIS — G4700 Insomnia, unspecified: Secondary | ICD-10-CM

## 2019-06-18 DIAGNOSIS — E1129 Type 2 diabetes mellitus with other diabetic kidney complication: Secondary | ICD-10-CM

## 2019-06-18 DIAGNOSIS — I10 Essential (primary) hypertension: Secondary | ICD-10-CM

## 2019-06-18 DIAGNOSIS — I2510 Atherosclerotic heart disease of native coronary artery without angina pectoris: Secondary | ICD-10-CM

## 2019-06-18 DIAGNOSIS — K219 Gastro-esophageal reflux disease without esophagitis: Secondary | ICD-10-CM

## 2019-06-18 NOTE — Progress Notes (Signed)
Name: Dawn Werner   MRN: [id number]    DOB: 03/10/1951   Date:06/18/2019       Progress Note  Subjective  Chief Complaint  Chief Complaint  Patient presents with  . Asthma  . Diabetes  . Hypertension  . Hyperlipidemia  . Arthritis    She has been having arthritic pain in her shoulders and knees x 1 month. Pain comes and goes. Pain in her shoulder is stiffness and it aches. Pain in her knees is bone on bone.    I connected with  [place] on 06/18/19 at  8:20 AM EST by telephone and verified that I am speaking with the correct person using two identifiers.  I discussed the limitations, risks, security and privacy concerns of performing an evaluation and management service by telephone and the availability of in person appointments. Staff also discussed with the patient that there may be a patient responsible charge related to this service. Patient Location: at home  Provider Location: [facility]   HPI  DMII:  HerhgbA1C went down from 7.8% to 7.3%, up to 7.4%, 7.3%,6.9%, 6.8%,7.5%6.8%down to 6.5% Shehas history of nephropathy and is on ARB, no neuropathy She denies polyphagia, polydipsia or polyuria.She states she may have gained some weight since last visit. She is eating healthy meals, cooking at home   Right knee pain: she states symptoms started a couple of weeks ago, no effusion or redness , she states it is a throbbing pain, started when she started to do a lot of cleaning around her house. Pain is not constant, no pain at this time. She states she had a steroid injection from Ortho a couple of months ago. Pain is intermittently   Hyperlipidemia: she is back on Crestor 40 mgand Zetia 10 mg dailyand states no longer has muscle pain or cramps. No chest pain. Advised her to stop by for labs next week   Insomnia: she was trying to wean self off of Ambien, she has 10 mg tablets, and currently taking abite off her pills, but states  Ambien 5 mg is more expensiveand not enough, she thinks she is taking about 7 mg.   HTN: No chest pain, palpitation or SOB , taking medication as prescribed. She states not checking bp in a while , bp machine broke   CAD: no episodes, s/p history of MI in 2009, taking aspirin daily, beta-blocker and statin therapy. No chest pain, no decrease in exercise tolerance. Doing well   Asthma Mild Intermittent: she has been doing well, no cough, wheezing or SOB lately , not taking  Breo, she still has a rescue inhaler at home, unchanged   Atherosclerosis of aorta: on statin and aspirin, recheck labs once a year    Patient Active Problem List   Diagnosis Date Noted  . Belching 08/18/2018  . 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 Derangement of posterior horn of lateral meniscus of right knee 01/10/2016  . 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 Primary osteoarthritis of knee 01/24/2015  . Asthma, mild intermittent 11/18/2014  . Arteriosclerosis of coronary artery 11/18/2014  . Insomnia, persistent 11/18/2014  . Dyslipidemia 11/18/2014  . Acid reflux 11/18/2014  . Diabetes type 2, controlled ([place]) 11/18/2014  . Genital herpes in women 11/18/2014  . Hypertension 11/18/2014  . Asymptomatic postmenopausal status 11/18/2014  . History of acute myocardial infarction 11/18/2014    Past Surgical History:  Procedure Laterality Date  . ABDOMINAL HYSTERECTOMY    . BREAST SURGERY  breast reduction  . REDUCTION MAMMAPLASTY Bilateral 1995  . TUBAL LIGATION      Family History  Problem Relation Age of Onset  . Hyperlipidemia Mother   . Hypertension Mother   . Stroke Mother   . Dementia Father   . Cancer Sister        breast  . Breast cancer Sister 85  . Asthma Brother   . Depression Brother        bipolar  . Heart disease Brother   . Heart disease Brother       Current Outpatient Medications:  .  ACCU-CHEK GUIDE test strip, USE TO TEST BLOOD SUGAR UP TO 4 TIMES A DAY AS DIRECTED, Disp:  , Rfl:  .  Accu-Chek Softclix Lancets lancets, USE TO TEST BLOOD SUGAR UP TO 4 TIMES A DAY AS DIRECTED, Disp: , Rfl:  .  acyclovir ointment (ZOVIRAX) 5 %, Apply 1 application topically 2 (two) times daily as needed. Reported on 06/13/2015, Disp: 30 g, Rfl: 0 .  albuterol (PROVENTIL HFA) 108 (90 BASE) MCG/ACT inhaler, Inhale 2 puffs into the lungs as needed., Disp: , Rfl:  .  aspirin EC 81 MG tablet, Take 1 tablet by mouth daily., Disp: , Rfl:  .  blood glucose meter kit and supplies, Dispense based on patient and insurance preference. Use up to four times daily as directed. (FOR ICD-10 E10.9, E11.9)., Disp: 1 each, Rfl: 0 .  carvedilol (COREG) 6.25 MG tablet, TAKE 1 TABLET BY MOUTH TWICE A DAY, Disp: 180 tablet, Rfl: 1 .  colchicine 0.6 MG tablet, Take 1 tablet (0.6 mg total) by mouth daily. Take two at onset of gout and repeat in 1 hour, prn, Disp: 30 tablet, Rfl: 0 .  ezetimibe (ZETIA) 10 MG tablet, TAKE 1 TABLET BY MOUTH EVERY DAY, Disp: 90 tablet, Rfl: 1 .  losartan (COZAAR) 25 MG tablet, TAKE 2 TABLET BY MOUTH EVERY DAY, Disp: 180 tablet, Rfl: 1 .  metFORMIN (GLUCOPHAGE-XR) 500 MG 24 hr tablet, TAKE 1 TABLET BY MOUTH EVERY DAY WITH BREAKFAST, Disp: 90 tablet, Rfl: 1 .  omeprazole (PRILOSEC) 40 MG capsule, Take 1 capsule (40 mg total) by mouth daily., Disp: 30 capsule, Rfl: 3 .  phenazopyridine (PYRIDIUM) 200 MG tablet, Take 1 tablet (200 mg total) by mouth 3 (three) times daily. (Patient not taking: Reported on 04/30/2019), Disp: 6 tablet, Rfl: 0 .  rosuvastatin (CRESTOR) 40 MG tablet, TAKE 1 TABLET BY MOUTH EVERY DAY, Disp: 90 tablet, Rfl: 1 .  Simethicone 125 MG TABS, Take 1 tablet (125 mg total) by mouth 4 (four) times daily as needed (For Belching or Flatulence)., Disp: 120 tablet, Rfl: 0 .  valACYclovir (VALTREX) 500 MG tablet, TAKE 1 TABLET BY MOUTH EVERY DAY, Disp: 90 tablet, Rfl: 1 .  zolpidem (AMBIEN) 10 MG tablet, TAKE 1 TABLET BY MOUTH EVERYDAY AT BEDTIME, Disp: 90 tablet, Rfl:  0  Allergies  Allergen Reactions  . Metformin And Related Diarrhea    And nausea. Tolerates XR formulation    I personally reviewed active problem list, medication list, allergies, family history, social history, health maintenance with the patient/caregiver today.   ROS  Ten systems reviewed and is negative except as mentioned in HPI   Objective  Virtual encounter, vitals not obtained.  There is no height or weight on file to calculate BMI.  Physical Exam  Awake, alert and oriented  PHQ2/9: Depression screen [facility] 2/9 06/18/2019 04/30/2019 02/16/2019 10/15/2018 08/18/2018  Decreased Interest 0 0 0 0 0  Down, Depressed, Hopeless 0 0 0 1 0  PHQ - 2 Score 0 0 0 1 0  Altered sleeping 0 0 3 1 0  Tired, decreased energy 0 0 0 0 0  Change in appetite 0 0 0 0 0  Feeling bad or failure about yourself  0 0 0 0 0  Trouble concentrating 0 0 0 0 0  Moving slowly or fidgety/restless 0 0 0 0 0  Suicidal thoughts 0 0 0 0 0  PHQ-9 Score 0 0 3 2 0  Difficult doing work/chores - Not difficult at all Not difficult at all Not difficult at all Not difficult at all  Some recent data might be hidden   PHQ-2/9 Result is negative.    Fall Risk: Fall Risk  06/18/2019 04/30/2019 02/16/2019 10/15/2018 08/18/2018  Falls in the past year? 0 0 0 0 0  Number falls in past yr: 0 0 0 0 0  Injury with Fall? 0 0 0 0 0  Risk for fall due to : - - - - -  Risk for fall due to: Comment - - - - -  Follow up - - - Falls prevention discussed -     Assessment & Plan  1. Type 2 diabetes mellitus with microalbuminuria, without long-term current use of insulin (HCC)  - Hemoglobin A1c - Microalbumin / creatinine urine ratio  2. Arteriosclerosis of coronary artery  - Lipid panel  3. Essential hypertension  - COMPLETE METABOLIC PANEL WITH GFR - CBC with Differential/Platelet  4. Dyslipidemia  - Lipid panel  5. Mild intermittent asthma without complication   6. Gastroesophageal reflux disease without  esophagitis  Diet controlled  7. Insomnia, persistent  She just filled her Ambien rx  I discussed the assessment and treatment plan with the patient. The patient was provided an opportunity to ask questions and all were answered. The patient agreed with the plan and demonstrated an understanding of the instructions.   The patient was advised to call back or seek an in-person evaluation if the symptoms worsen or if the condition fails to improve as anticipated.  I provided 25 minutes of non-face-to-face time during this encounter.  Loistine Chance, MD

## 2019-06-19 ENCOUNTER — Other Ambulatory Visit: Payer: Self-pay | Admitting: Family Medicine

## 2019-06-20 ENCOUNTER — Other Ambulatory Visit: Payer: Self-pay | Admitting: Family Medicine

## 2019-06-20 DIAGNOSIS — A6009 Herpesviral infection of other urogenital tract: Secondary | ICD-10-CM

## 2019-06-23 ENCOUNTER — Other Ambulatory Visit: Payer: Self-pay | Admitting: Family Medicine

## 2019-06-23 DIAGNOSIS — I10 Essential (primary) hypertension: Secondary | ICD-10-CM | POA: Diagnosis not present

## 2019-06-23 DIAGNOSIS — I2510 Atherosclerotic heart disease of native coronary artery without angina pectoris: Secondary | ICD-10-CM | POA: Diagnosis not present

## 2019-06-23 DIAGNOSIS — R809 Proteinuria, unspecified: Secondary | ICD-10-CM | POA: Diagnosis not present

## 2019-06-23 DIAGNOSIS — E1129 Type 2 diabetes mellitus with other diabetic kidney complication: Secondary | ICD-10-CM | POA: Diagnosis not present

## 2019-06-23 DIAGNOSIS — E785 Hyperlipidemia, unspecified: Secondary | ICD-10-CM | POA: Diagnosis not present

## 2019-06-24 LAB — CBC WITH DIFFERENTIAL/PLATELET
Absolute Monocytes: 494 cells/uL (ref 200–950)
Basophils Absolute: 43 cells/uL (ref 0–200)
Basophils Relative: 0.7 %
Eosinophils Absolute: 73 cells/uL (ref 15–500)
Eosinophils Relative: 1.2 %
HCT: 40.4 % (ref 35.0–45.0)
Hemoglobin: 13.5 g/dL (ref 11.7–15.5)
Lymphs Abs: 1909 cells/uL (ref 850–3900)
MCH: 28.8 pg (ref 27.0–33.0)
MCHC: 33.4 g/dL (ref 32.0–36.0)
MCV: 86.1 fL (ref 80.0–100.0)
MPV: 10.4 fL (ref 7.5–12.5)
Monocytes Relative: 8.1 %
Neutro Abs: 3581 cells/uL (ref 1500–7800)
Neutrophils Relative %: 58.7 %
Platelets: 278 10*3/uL (ref 140–400)
RBC: 4.69 10*6/uL (ref 3.80–5.10)
RDW: 11.6 % (ref 11.0–15.0)
Total Lymphocyte: 31.3 %
WBC: 6.1 10*3/uL (ref 3.8–10.8)

## 2019-06-24 LAB — COMPLETE METABOLIC PANEL WITH GFR
AG Ratio: 2 (calc) (ref 1.0–2.5)
ALT: 17 U/L (ref 6–29)
AST: 16 U/L (ref 10–35)
Albumin: 4.7 g/dL (ref 3.6–5.1)
Alkaline phosphatase (APISO): 60 U/L (ref 37–153)
BUN: 7 mg/dL (ref 7–25)
CO2: 30 mmol/L (ref 20–32)
Calcium: 10 mg/dL (ref 8.6–10.4)
Chloride: 101 mmol/L (ref 98–110)
Creat: 0.58 mg/dL (ref 0.50–0.99)
GFR, Est African American: 109 mL/min/{1.73_m2} (ref >=60)
GFR, Est Non African American: 94 mL/min/{1.73_m2} (ref >=60)
Globulin: 2.4 g/dL (calc) (ref 1.9–3.7)
Glucose, Bld: 122 mg/dL — ABNORMAL HIGH (ref 65–99)
Potassium: 4 mmol/L (ref 3.5–5.3)
Sodium: 139 mmol/L (ref 135–146)
Total Bilirubin: 0.5 mg/dL (ref 0.2–1.2)
Total Protein: 7.1 g/dL (ref 6.1–8.1)

## 2019-06-24 LAB — LIPID PANEL
Cholesterol: 129 mg/dL (ref <200)
HDL: 38 mg/dL — ABNORMAL LOW (ref >=50)
LDL Cholesterol (Calc): 72 mg/dL (calc)
Non-HDL Cholesterol (Calc): 91 mg/dL (calc) (ref <130)
Total CHOL/HDL Ratio: 3.4 (calc) (ref <5.0)
Triglycerides: 109 mg/dL (ref <150)

## 2019-06-24 LAB — HEMOGLOBIN A1C
Hgb A1c MFr Bld: 6.5 % of total Hgb — ABNORMAL HIGH (ref <5.7)
Mean Plasma Glucose: 140 (calc)
eAG (mmol/L): 7.7 (calc)

## 2019-06-24 LAB — MICROALBUMIN / CREATININE URINE RATIO
Creatinine, Urine: 226 mg/dL (ref 20–275)
Microalb Creat Ratio: 29 mcg/mg creat (ref <30)
Microalb, Ur: 6.5 mg/dL

## 2019-07-10 ENCOUNTER — Other Ambulatory Visit: Payer: Self-pay | Admitting: Family Medicine

## 2019-07-30 ENCOUNTER — Other Ambulatory Visit: Payer: Self-pay | Admitting: Family Medicine

## 2019-07-30 DIAGNOSIS — I252 Old myocardial infarction: Secondary | ICD-10-CM

## 2019-07-30 DIAGNOSIS — I10 Essential (primary) hypertension: Secondary | ICD-10-CM

## 2019-08-01 ENCOUNTER — Other Ambulatory Visit: Payer: Self-pay | Admitting: Family Medicine

## 2019-08-01 DIAGNOSIS — I252 Old myocardial infarction: Secondary | ICD-10-CM

## 2019-08-01 DIAGNOSIS — E785 Hyperlipidemia, unspecified: Secondary | ICD-10-CM

## 2019-08-02 ENCOUNTER — Other Ambulatory Visit: Payer: Self-pay | Admitting: Family Medicine

## 2019-08-02 DIAGNOSIS — R809 Proteinuria, unspecified: Secondary | ICD-10-CM

## 2019-08-02 DIAGNOSIS — E1129 Type 2 diabetes mellitus with other diabetic kidney complication: Secondary | ICD-10-CM

## 2019-08-22 ENCOUNTER — Other Ambulatory Visit: Payer: Self-pay | Admitting: Family Medicine

## 2019-08-22 DIAGNOSIS — I252 Old myocardial infarction: Secondary | ICD-10-CM

## 2019-08-22 DIAGNOSIS — E1129 Type 2 diabetes mellitus with other diabetic kidney complication: Secondary | ICD-10-CM

## 2019-08-22 DIAGNOSIS — I10 Essential (primary) hypertension: Secondary | ICD-10-CM

## 2019-08-22 NOTE — Telephone Encounter (Signed)
Requested Prescriptions  Pending Prescriptions Disp Refills  . losartan (COZAAR) 25 MG tablet [Pharmacy Med Name: LOSARTAN POTASSIUM 25 MG TAB] 180 tablet 1    Sig: TAKE 2 TABLETS BY MOUTH EVERY DAY     Cardiovascular:  Angiotensin Receptor Blockers Failed - 08/22/2019  9:56 AM      Failed - Last BP in normal range    BP Readings from Last 1 Encounters:  04/24/19 (!) 162/74         Failed - Valid encounter within last 6 months    Recent Outpatient Visits          2 months ago Type 2 diabetes mellitus with microalbuminuria, without long-term current use of insulin [facility])   [facility] [place], Danna Hefty, MD   3 months ago Acute cystitis with hematuria   [facility] Danelle Berry, PA-C   6 months ago Type 2 diabetes mellitus with microalbuminuria, without long-term current use of insulin [facility])   [facility] [place], Danna Hefty, MD   10 months ago Type 2 diabetes mellitus with microalbuminuria, without long-term current use of insulin [facility])   [facility] Alba Cory, MD   10 months ago Erroneous encounter - disregard   [facility] Doren Custard, FNP      Future Appointments            In 1 month  [facility]   In 1 month Alba Cory, MD [facility], [facility]           Passed - Cr in normal range and within 180 days    Creat  Date Value Ref Range Status  06/23/2019 0.58 0.50 - 0.99 mg/dL Final    Comment:    For patients >58 years of age, the reference limit for Creatinine is approximately 13% higher for people identified as African-American. .    Creatinine, Urine  Date Value Ref Range Status  06/23/2019 226 20 - 275 mg/dL Final         Passed - K in normal range and within 180 days    Potassium  Date Value Ref Range Status  06/23/2019 4.0 3.5 - 5.3 mmol/L Final         Passed - Patient is not pregnant

## 2019-08-30 ENCOUNTER — Other Ambulatory Visit: Payer: Self-pay | Admitting: Family Medicine

## 2019-08-30 DIAGNOSIS — G4700 Insomnia, unspecified: Secondary | ICD-10-CM

## 2019-08-30 NOTE — Telephone Encounter (Signed)
Requested medication (s) are due for refill today: yes  Requested medication (s) are on the active medication list: yes  Last refill: 05/27/2019  Future visit scheduled: yes  Notes to clinic:  not delegated    Requested Prescriptions  Pending Prescriptions Disp Refills   zolpidem (AMBIEN) 10 MG tablet [Pharmacy Med Name: ZOLPIDEM TARTRATE 10 MG TABLET] 90 tablet 0    Sig: TAKE 1 TABLET BY MOUTH EVERYDAY AT BEDTIME      Not Delegated - Psychiatry:  Anxiolytics/Hypnotics Failed - 08/30/2019 10:30 AM      Failed - This refill cannot be delegated      Failed - Urine Drug Screen completed in last 360 days.      Failed - Valid encounter within last 6 months    Recent Outpatient Visits           2 months ago Type 2 diabetes mellitus with microalbuminuria, without long-term current use of insulin [facility])   [facility] [place], Danna Hefty, MD   4 months ago Acute cystitis with hematuria   [facility] Danelle Berry, PA-C   6 months ago Type 2 diabetes mellitus with microalbuminuria, without long-term current use of insulin [facility])   [facility] Alba Cory, MD   10 months ago Type 2 diabetes mellitus with microalbuminuria, without long-term current use of insulin [facility])   [facility] Alba Cory, MD   10 months ago Erroneous encounter - disregard   [facility] Doren Custard, FNP       Future Appointments             In 1 month  [facility], [facility]   In 1 month Alba Cory, MD [facility], [facility]

## 2019-09-02 ENCOUNTER — Other Ambulatory Visit: Payer: Self-pay | Admitting: Family Medicine

## 2019-10-14 DIAGNOSIS — H2513 Age-related nuclear cataract, bilateral: Secondary | ICD-10-CM | POA: Diagnosis not present

## 2019-10-14 LAB — HM DIABETES EYE EXAM

## 2019-10-19 ENCOUNTER — Other Ambulatory Visit: Payer: Self-pay

## 2019-10-19 ENCOUNTER — Encounter: Payer: Self-pay | Admitting: Family Medicine

## 2019-10-19 ENCOUNTER — Ambulatory Visit (INDEPENDENT_AMBULATORY_CARE_PROVIDER_SITE_OTHER): Payer: Medicare Other

## 2019-10-19 ENCOUNTER — Ambulatory Visit (INDEPENDENT_AMBULATORY_CARE_PROVIDER_SITE_OTHER): Payer: Medicare Other | Admitting: Family Medicine

## 2019-10-19 VITALS — BP 110/60 | HR 72 | Temp 96.9°F | Resp 16 | Ht 62.0 in | Wt 158.1 lb

## 2019-10-19 DIAGNOSIS — E785 Hyperlipidemia, unspecified: Secondary | ICD-10-CM | POA: Diagnosis not present

## 2019-10-19 DIAGNOSIS — R1319 Other dysphagia: Secondary | ICD-10-CM

## 2019-10-19 DIAGNOSIS — I10 Essential (primary) hypertension: Secondary | ICD-10-CM | POA: Diagnosis not present

## 2019-10-19 DIAGNOSIS — A6009 Herpesviral infection of other urogenital tract: Secondary | ICD-10-CM

## 2019-10-19 DIAGNOSIS — R809 Proteinuria, unspecified: Secondary | ICD-10-CM

## 2019-10-19 DIAGNOSIS — E1129 Type 2 diabetes mellitus with other diabetic kidney complication: Secondary | ICD-10-CM

## 2019-10-19 DIAGNOSIS — Z0000 Encounter for general adult medical examination without abnormal findings: Secondary | ICD-10-CM | POA: Diagnosis not present

## 2019-10-19 DIAGNOSIS — R1310 Dysphagia, unspecified: Secondary | ICD-10-CM

## 2019-10-19 DIAGNOSIS — I252 Old myocardial infarction: Secondary | ICD-10-CM

## 2019-10-19 DIAGNOSIS — G4700 Insomnia, unspecified: Secondary | ICD-10-CM

## 2019-10-19 DIAGNOSIS — K219 Gastro-esophageal reflux disease without esophagitis: Secondary | ICD-10-CM | POA: Diagnosis not present

## 2019-10-19 DIAGNOSIS — I2510 Atherosclerotic heart disease of native coronary artery without angina pectoris: Secondary | ICD-10-CM

## 2019-10-19 DIAGNOSIS — J4520 Mild intermittent asthma, uncomplicated: Secondary | ICD-10-CM

## 2019-10-19 LAB — POCT GLYCOSYLATED HEMOGLOBIN (HGB A1C): Hemoglobin A1C: 6.4 % — AB (ref 4.0–5.6)

## 2019-10-19 MED ORDER — ZOLPIDEM TARTRATE 10 MG PO TABS: ORAL_TABLET | ORAL | 0 refills | Status: DC

## 2019-10-19 MED ORDER — ROSUVASTATIN CALCIUM 40 MG PO TABS: 40.0000 mg | ORAL_TABLET | Freq: Every day | ORAL | 2 refills | Status: DC

## 2019-10-19 MED ORDER — OMEPRAZOLE 40 MG PO CPDR: 40.0000 mg | DELAYED_RELEASE_CAPSULE | Freq: Every day | ORAL | 1 refills | Status: DC

## 2019-10-19 MED ORDER — METFORMIN HCL ER 500 MG PO TB24: 500.0000 mg | ORAL_TABLET | Freq: Every day | ORAL | 1 refills | Status: DC

## 2019-10-19 MED ORDER — LOSARTAN POTASSIUM 50 MG PO TABS: 50.0000 mg | ORAL_TABLET | Freq: Every day | ORAL | 1 refills | Status: DC

## 2019-10-19 MED ORDER — EZETIMIBE 10 MG PO TABS: 10.0000 mg | ORAL_TABLET | Freq: Every day | ORAL | 1 refills | Status: DC

## 2019-10-19 MED ORDER — CARVEDILOL 6.25 MG PO TABS: 6.2500 mg | ORAL_TABLET | Freq: Two times a day (BID) | ORAL | 1 refills | Status: DC

## 2019-10-19 NOTE — Progress Notes (Signed)
Subjective:   Dawn Werner is a 69 y.o. female who presents for Medicare Annual (Subsequent) preventive examination.  Review of Systems:   Cardiac Risk Factors include: advanced age (>64mn, >>64women);diabetes mellitus;dyslipidemia;hypertension     Objective:     Vitals: BP 110/60 (BP Location: Right Arm, Patient Position: Sitting, Cuff Size: Normal)   Pulse 72   Temp (!) 96.9 F (36.1 C) (Temporal)   Resp 16   Ht 5' 2"  (1.575 m)   Wt 158 lb 1.6 oz (71.7 kg)   SpO2 96%   BMI 28.92 kg/m   Body mass index is 28.92 kg/m.  Advanced Directives 10/19/2019 04/24/2019 10/15/2018 10/10/2017 09/15/2017 04/04/2017 01/29/2017  Does Patient Have a Medical Advance Directive? No No No No No No No  Would patient like information on creating a medical advance directive? No - Patient declined - Yes (MAU/Ambulatory/Procedural Areas - Information given) Yes (MAU/Ambulatory/Procedural Areas - Information given) - - -    Tobacco Social History   Tobacco Use  Smoking Status Never Smoker  Smokeless Tobacco Never Used     Counseling given: Not Answered   Clinical Intake:  Pre-visit preparation completed: Yes  Pain : 0-10 Pain Score: 4  Pain Type: Acute pain Pain Location: Shoulder Pain Orientation: Right, Left Pain Descriptors / Indicators: Aching, Sore Pain Onset: More than a month ago Pain Frequency: Intermittent     BMI - recorded: 28.92 Nutritional Status: BMI 25 -29 Overweight Nutritional Risks: None Diabetes: Yes CBG done?: No Did pt. bring in CBG monitor from home?: No   Nutrition Risk Assessment:  Has the patient had any N/V/D within the last 2 months?  No  Does the patient have any non-healing wounds?  No  Has the patient had any unintentional weight loss or weight gain?  No   Diabetes:  Is the patient diabetic?  Yes  If diabetic, was a CBG obtained today?  No  Did the patient bring in their glucometer from home?  No  How often do you monitor your CBG's?  daily.   Financial Strains and Diabetes Management:  Are you having any financial strains with the device, your supplies or your medication? No .  Does the patient want to be seen by Chronic Care Management for management of their diabetes?  No  Would the patient like to be referred to a Nutritionist or for Diabetic Management?  No   Diabetic Exams:  Diabetic Eye Exam: Completed 10/14/19 negative retinopathy.   Diabetic Foot Exam: Completed 02/16/19.   How often do you need to have someone help you when you read instructions, pamphlets, or other written materials from your doctor or pharmacy?: 1 - Never  Interpreter Needed?: No  Information entered by :: KClemetine MarkerLPN  Past Medical History:  Diagnosis Date  . Anxiety   . Asthma   . Diabetes mellitus without complication ([place]   . GERD (gastroesophageal reflux disease)   . Hyperlipidemia   . Insomnia    Past Surgical History:  Procedure Laterality Date  . ABDOMINAL HYSTERECTOMY    . BREAST SURGERY     breast reduction  . REDUCTION MAMMAPLASTY Bilateral 1995  . TUBAL LIGATION     Family History  Problem Relation Age of Onset  . Hyperlipidemia Mother   . Hypertension Mother   . Stroke Mother   . Dementia Father   . Cancer Sister        breast  . Breast cancer Sister 425 . Asthma Brother   .  Depression Brother        bipolar  . Heart disease Brother   . Heart disease Brother    Social History   Socioeconomic History  . Marital status: Married    Spouse name: [name]  . Number of children: 2  . Years of education: Not on file  . Highest education level: 12th grade  Occupational History  . 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 Smoking status: Never Smoker  . Smokeless tobacco: Never Used  Substance and Sexual Activity  . Alcohol use: No    Alcohol/week: 0.0 standard drinks  . Drug use: No  . Sexual activity: Yes    Partners: Male  Other Topics Concern  . Not on file  Social History  Narrative  . Not on file   Social Determinants of Health   Financial Resource Strain: Low Risk   . Difficulty of Paying Living Expenses: Not hard at all  Food Insecurity: No Food Insecurity  . Worried About Charity fundraiser in the Last Year: Never true  . Ran Out of Food in the Last Year: Never true  Transportation Needs: No Transportation Needs  . Lack of Transportation (Medical): No  . Lack of Transportation (Non-Medical): No  Physical Activity: Sufficiently Active  . Days of Exercise per Week: 7 days  . Minutes of Exercise per Session: 30 min  Stress: No Stress Concern Present  . Feeling of Stress : Only a little  Social Connections: Not Isolated  . Frequency of Communication with Friends and Family: More than three times a week  . Frequency of Social Gatherings with Friends and Family: More than three times a week  . Attends Religious Services: More than 4 times per year  . Active Member of Clubs or Organizations: Yes  . Attends Archivist Meetings: More than 4 times per year  . Marital Status: Married    Outpatient Encounter Medications as of 10/19/2019  Medication Sig  . ACCU-CHEK GUIDE test strip USE TO TEST BLOOD SUGAR UP TO 4 TIMES A DAY AS DIRECTED  . Accu-Chek Softclix Lancets lancets USE TO TEST BLOOD SUGAR UP TO 4 TIMES A DAY AS DIRECTED  . Blood Glucose Monitoring Suppl (ACCU-CHEK GUIDE ME) w/Device KIT USE AS DIRECTED  . carvedilol (COREG) 6.25 MG tablet TAKE 1 TABLET BY MOUTH TWICE A DAY  . ezetimibe (ZETIA) 10 MG tablet TAKE 1 TABLET BY MOUTH EVERY DAY  . losartan (COZAAR) 25 MG tablet TAKE 2 TABLETS BY MOUTH EVERY DAY  . metFORMIN (GLUCOPHAGE-XR) 500 MG 24 hr tablet TAKE 1 TABLET BY MOUTH EVERY DAY WITH BREAKFAST  . omeprazole (PRILOSEC) 40 MG capsule Take 1 capsule (40 mg total) by mouth daily.  . rosuvastatin (CRESTOR) 40 MG tablet TAKE 1 TABLET BY MOUTH EVERY DAY  . zolpidem (AMBIEN) 10 MG tablet TAKE 1 TABLET BY MOUTH EVERYDAY AT BEDTIME  .  albuterol (PROVENTIL HFA) 108 (90 BASE) MCG/ACT inhaler Inhale 2 puffs into the lungs as needed.  Marland Kitchen aspirin EC 81 MG tablet Take 1 tablet by mouth daily.  . colchicine 0.6 MG tablet Take 1 tablet (0.6 mg total) by mouth daily. Take two at onset of gout and repeat in 1 hour, prn  . Simethicone 125 MG TABS Take 1 tablet (125 mg total) by mouth 4 (four) times daily as needed (For Belching or Flatulence).  . [DISCONTINUED] acyclovir ointment (ZOVIRAX) 5 % Apply 1 application topically 2 (two) times daily as  needed. Reported on 06/13/2015  . [DISCONTINUED] phenazopyridine (PYRIDIUM) 200 MG tablet Take 1 tablet (200 mg total) by mouth 3 (three) times daily. (Patient not taking: Reported on 04/30/2019)  . [DISCONTINUED] valACYclovir (VALTREX) 500 MG tablet TAKE 1 TABLET BY MOUTH EVERY DAY   No facility-administered encounter medications on file as of 10/19/2019.    Activities of Daily Living In your present state of health, do you have any difficulty performing the following activities: 10/19/2019 10/19/2019  Hearing? N N  Comment declines hearing aids -  Vision? N N  Difficulty concentrating or making decisions? N N  Walking or climbing stairs? N N  Dressing or bathing? N N  Doing errands, shopping? N N  Preparing Food and eating ? N -  Using the Toilet? N -  In the past six months, have you accidently leaked urine? N -  Do you have problems with loss of bowel control? N -  Managing your Medications? N -  Managing your Finances? N -  Housekeeping or managing your Housekeeping? N -  Some recent data might be hidden    Patient Care Team: Steele Sizer, MD as PCP - General (Family Medicine)    Assessment:   This is a routine wellness examination for Dawn.  Exercise Activities and Dietary recommendations Current Exercise Habits: Home exercise routine, Type of exercise: Other - see comments(gardening), Time (Minutes): 30, Frequency (Times/Week): 7, Weekly Exercise (Minutes/Week): 210,  Intensity: Mild, Exercise limited by: None identified  Goals    . DIET - INCREASE WATER INTAKE     Recommend to drink at least 6-8 8oz glasses of water per day.    . maintain weight     Patient would like to maintain her weight to be less than 160#       Fall Risk Fall Risk  10/19/2019 10/19/2019 06/18/2019 04/30/2019 02/16/2019  Falls in the past year? 0 0 0 0 0  Number falls in past yr: 0 0 0 0 0  Injury with Fall? 0 0 0 0 0  Risk for fall due to : - No Fall Risks - - -  Risk for fall due to: Comment - - - - -  Follow up - Falls prevention discussed - - -   FALL RISK PREVENTION PERTAINING TO THE HOME:  Any stairs in or around the home? Yes  If so, do they handrails? Yes   Home free of loose throw rugs in walkways, pet beds, electrical cords, etc? Yes  Adequate lighting in your home to reduce risk of falls? Yes   ASSISTIVE DEVICES UTILIZED TO PREVENT FALLS:  Life alert? No  Use of a cane, walker or w/c? No  Grab bars in the bathroom? Yes  Shower chair or bench in shower? Yes  Elevated toilet seat or a handicapped toilet? No   DME ORDERS:  DME order needed?  No   TIMED UP AND GO:  Was the test performed? Yes .  Length of time to ambulate 10 feet: 5 sec.   GAIT:  Appearance of gait: Gait stead-fast and without the use of an assistive device.   Education: Fall risk prevention has been discussed.  Intervention(s) required? No   Depression Screen PHQ 2/9 Scores 10/19/2019 10/19/2019 06/18/2019 04/30/2019  PHQ - 2 Score 0 0 0 0  PHQ- 9 Score 0 - 0 0     Cognitive Function     6CIT Screen 10/19/2019 10/15/2018 10/10/2017 06/24/2016  What Year? 0 points 0 points 0 points 0  points  What month? 0 points 0 points 0 points 0 points  What time? 0 points 0 points 0 points 0 points  Count back from 20 0 points 0 points 0 points 0 points  Months in reverse 0 points 0 points 0 points 0 points  Repeat phrase 4 points 4 points 0 points 2 points  Total Score 4 4 0 2     Immunization History  Administered Date(s) Administered  . Fluad Quad(high Dose 65+) 02/16/2019  . Influenza, High Dose Seasonal PF 02/20/2015, 05/24/2016, 01/29/2017, 02/10/2018  . Moderna SARS-COVID-2 Vaccination 07/05/2019, 08/02/2019  . Pneumococcal Conjugate-13 01/10/2016  . Pneumococcal Polysaccharide-23 11/18/2014  . Td 01/29/2017  . Tdap 04/10/2007  . Tetanus 04/10/2007    Qualifies for Shingles Vaccine? Yes  . Due for Shingrix. Education has been provided regarding the importance of this vaccine. Pt has been advised to call insurance company to determine out of pocket expense. Advised may also receive vaccine at local pharmacy or Health Dept. Verbalized acceptance and understanding.  Tdap: Up to date  Flu Vaccine: Up to date  Pneumococcal Vaccine: Up to date  Covid-19 Vaccine: Up to date   Screening Tests Health Maintenance  Topic Date Due  . HEMOGLOBIN A1C  12/21/2019  . INFLUENZA VACCINE  01/09/2020  . FOOT EXAM  02/16/2020  . MAMMOGRAM  03/25/2020  . OPHTHALMOLOGY EXAM  10/13/2020  . COLONOSCOPY  06/19/2023  . TETANUS/TDAP  01/30/2027  . DEXA SCAN  Completed  . COVID-19 Vaccine  Completed  . Hepatitis C Screening  Completed  . PNA vac Low Risk Adult  Completed    Cancer Screenings:  Colorectal Screening: Completed 06/18/13. Repeat every 10 years;   Mammogram: Completed 03/26/19. Repeat every year;   Bone Density: Completed 2008. Results reflect NORMAL. Pt declines repeat screening at this time.   Lung Cancer Screening: (Low Dose CT Chest recommended if Age 53-80 years, 30 pack-year currently smoking OR have quit w/in 15years.) does not qualify.    Additional Screening:  Hepatitis C Screening: does qualify; Completed 06/26/12  Vision Screening: Recommended annual ophthalmology exams for early detection of glaucoma and other disorders of the eye. Is the patient up to date with their annual eye exam?  Yes  Who is the provider or what is the name of the  office in which the pt attends annual eye exams? [place] Screening: Recommended annual dental exams for proper oral hygiene  Community Resource Referral:  CRR required this visit?  No       Plan:     I have personally reviewed and addressed the Medicare Annual Wellness questionnaire and have noted the following in the patient's chart:  A. Medical and social history B. Use of alcohol, tobacco or illicit drugs  C. Current medications and supplements D. Functional ability and status E.  Nutritional status F.  Physical activity G. Advance directives H. List of other physicians I.  Hospitalizations, surgeries, and ER visits in previous 12 months J.  [place] such as hearing and vision if needed, cognitive and depression L. Referrals and appointments   In addition, I have reviewed and discussed with patient certain preventive protocols, quality metrics, and best practice recommendations. A written personalized care plan for preventive services as well as general preventive health recommendations were provided to patient.   Signed,  Clemetine Marker, LPN Nurse Health Advisor   Nurse Notes: pt c/o possible arthritis pain in shoulders. She is also still having difficulty sleeping at  times even though taking ambien every night. Pt also taking omeprazole 40 mg daily but still having acid reflux and interested in trying different medication.

## 2019-10-19 NOTE — Progress Notes (Signed)
Name: Dawn Werner   MRN: [id number]    DOB: 15-Dec-1950   Date:10/19/2019       Progress Note  Subjective  Chief Complaint  Chief Complaint  Patient presents with  . Hypertension  . Diabetes  . Dyslipidemia  . Gastroesophageal Reflux    HPI  DMII: HerhgbA1C went down from 7.8% to 7.3%, up to 7.4%, 7.3%,6.9%, 6.8%,7.5%6.8%down to 6.5% and today even better at 6.4 %  Shehas history of nephropathy and is on ARB, no neuropathyShe denies polyphagia, polydipsia or polyuria. She is eating healthy meals, cooking at home She is physically active in her garden. Denies hypoglycemic episodes   Hyperlipidemia: she is back on Crestor 40 mgand Zetia 10 mg dailyand states no longer has muscle pain or cramps. HDL has dropped a little, discussed increasing tree nuts, fish, continue physical exam.   Insomnia: she was trying to wean self off of Ambien, she has 10 mg tablets, and currently taking abite of the pill  but states Ambien 5 mg is more expensiveand not enough, she thinks she is taking about 7 mg daily, she needs a refill. She states for a period of time she was not sleeping well when her kids were worried about her weight loss. She states she is not sure if she was depressed, her weight went down to 143 lbs at home, last year it was 159 lbs and today is back to 158 lbs. She states normal appetite, screening tests are normal, she feels fine.   HTN: No chest pain, palpitation or SOB , taking medication as prescribed. Not checking bp at home. She denies dizziness BP towards low end of normal   CAD: no episodes, s/p history of MI in 2009, taking aspirin daily, beta-blocker and statin therapy. No chest pain, no decrease in exercise tolerance. Doing well   Asthma Mild Intermittent:she has been doing well, no cough, wheezing or SOB lately ,not taking  Breo, she still has a rescue inhaler at home.  Atherosclerosis of aorta: on statin and aspirin, lipid panel reviewed with  patient.   Dysphagia: she has noticed that red meat and chicken gets stuck on lower part of esophagus, discussed seeing GI for stretching. She wants to hold off for now, explained multiple etiologies including cancer.   Patient Active Problem List   Diagnosis Date Noted  . Belching 08/18/2018  . 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 Derangement of posterior horn of lateral meniscus of right knee 01/10/2016  . 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 Primary osteoarthritis of knee 01/24/2015  . Asthma, mild intermittent 11/18/2014  . Arteriosclerosis of coronary artery 11/18/2014  . Insomnia, persistent 11/18/2014  . Dyslipidemia 11/18/2014  . Acid reflux 11/18/2014  . Diabetes type 2, controlled ([facility]) 11/18/2014  . Genital herpes in women 11/18/2014  . Hypertension 11/18/2014  . Asymptomatic postmenopausal status 11/18/2014  . History of acute myocardial infarction 11/18/2014    Past Surgical History:  Procedure Laterality Date  . ABDOMINAL HYSTERECTOMY    . BREAST SURGERY     breast reduction  . REDUCTION MAMMAPLASTY Bilateral 1995  . TUBAL LIGATION      Family History  Problem Relation Age of Onset  . Hyperlipidemia Mother   . Hypertension Mother   . Stroke Mother   . Dementia Father   . Cancer Sister        breast  . Breast cancer Sister 9  . Asthma Brother   . Depression Brother        bipolar  .  Heart disease Brother   . Heart disease Brother     Social History   Tobacco Use  . Smoking status: Never Smoker  . Smokeless tobacco: Never Used  Substance Use Topics  . Alcohol use: No    Alcohol/week: 0.0 standard drinks     Current Outpatient Medications:  .  ACCU-CHEK GUIDE test strip, USE TO TEST BLOOD SUGAR UP TO 4 TIMES A DAY AS DIRECTED, Disp: 100 strip, Rfl: 11 .  Accu-Chek Softclix Lancets lancets, USE TO TEST BLOOD SUGAR UP TO 4 TIMES A DAY AS DIRECTED, Disp: 100 each, Rfl: 0 .  albuterol (PROVENTIL HFA) 108 (90 BASE) MCG/ACT inhaler, Inhale 2 puffs into the  lungs as needed., Disp: , Rfl:  .  aspirin EC 81 MG tablet, Take 1 tablet by mouth daily., Disp: , Rfl:  .  Blood Glucose Monitoring Suppl (ACCU-CHEK GUIDE ME) w/Device KIT, USE AS DIRECTED, Disp: 1 kit, Rfl: 0 .  carvedilol (COREG) 6.25 MG tablet, TAKE 1 TABLET BY MOUTH TWICE A DAY, Disp: 180 tablet, Rfl: 1 .  colchicine 0.6 MG tablet, Take 1 tablet (0.6 mg total) by mouth daily. Take two at onset of gout and repeat in 1 hour, prn, Disp: 30 tablet, Rfl: 0 .  ezetimibe (ZETIA) 10 MG tablet, TAKE 1 TABLET BY MOUTH EVERY DAY, Disp: 90 tablet, Rfl: 1 .  losartan (COZAAR) 25 MG tablet, TAKE 2 TABLETS BY MOUTH EVERY DAY, Disp: 180 tablet, Rfl: 1 .  metFORMIN (GLUCOPHAGE-XR) 500 MG 24 hr tablet, TAKE 1 TABLET BY MOUTH EVERY DAY WITH BREAKFAST, Disp: 90 tablet, Rfl: 1 .  omeprazole (PRILOSEC) 40 MG capsule, Take 1 capsule (40 mg total) by mouth daily., Disp: 30 capsule, Rfl: 3 .  rosuvastatin (CRESTOR) 40 MG tablet, TAKE 1 TABLET BY MOUTH EVERY DAY, Disp: 90 tablet, Rfl: 2 .  Simethicone 125 MG TABS, Take 1 tablet (125 mg total) by mouth 4 (four) times daily as needed (For Belching or Flatulence)., Disp: 120 tablet, Rfl: 0 .  zolpidem (AMBIEN) 10 MG tablet, TAKE 1 TABLET BY MOUTH EVERYDAY AT BEDTIME, Disp: 90 tablet, Rfl: 0  Allergies  Allergen Reactions  . Metformin And Related Diarrhea    And nausea. Tolerates XR formulation    I personally reviewed active problem list, medication list, allergies, family history, social history, health maintenance with the patient/caregiver today.   ROS  Constitutional: Negative for fever or weight change.  Respiratory: Negative for cough and shortness of breath.   Cardiovascular: Negative for chest pain or palpitations.  Gastrointestinal: Negative for abdominal pain, no bowel changes.  Musculoskeletal: Negative for gait problem or joint swelling.  Skin: Negative for rash.  Neurological: Negative for dizziness or headache.  No other specific complaints in a  complete review of systems (except as listed in HPI above).  Objective  Vitals:   10/19/19 0913  BP: 110/60  Pulse: 72  Resp: 16  Temp: (!) 96.9 F (36.1 C)  TempSrc: Temporal  SpO2: 96%  Weight: 158 lb 1.6 oz (71.7 kg)  Height: _0  (1.575 m)    Body mass index is 28.92 kg/m.  Physical Exam  Constitutional: Patient appears well-developed and well-nourished.  No distress.  HEENT: head atraumatic, normocephalic, pupils equal and reactive to light Cardiovascular: Normal rate, regular rhythm and normal heart sounds.  No murmur heard. No BLE edema. Pulmonary/Chest: Effort normal and breath sounds normal. No respiratory distress. Abdominal: Soft.  There is no tenderness. Psychiatric: Patient has a normal mood and affect.  behavior is normal. Judgment and thought content normal.  Recent Results (from the past 2160 hour(s))  HM DIABETES EYE EXAM     Status: None   Collection Time: 10/14/19 12:00 AM  Result Value Ref Range   HM Diabetic Eye Exam No Retinopathy No Retinopathy  POCT HgB A1C     Status: Abnormal   Collection Time: 10/19/19  9:18 AM  Result Value Ref Range   Hemoglobin A1C 6.4 (A) 4.0 - 5.6 %   HbA1c POC (<> result, manual entry)     HbA1c, POC (prediabetic range)     HbA1c, POC (controlled diabetic range)       PHQ2/9: Depression screen [facility] 2/9 10/19/2019 10/19/2019 06/18/2019 04/30/2019 02/16/2019  Decreased Interest 0 0 0 0 0  Down, Depressed, Hopeless 0 0 0 0 0  PHQ - 2 Score 0 0 0 0 0  Altered sleeping 0 - 0 0 3  Tired, decreased energy 0 - 0 0 0  Change in appetite 0 - 0 0 0  Feeling bad or failure about yourself  0 - 0 0 0  Trouble concentrating 0 - 0 0 0  Moving slowly or fidgety/restless 0 - 0 0 0  Suicidal thoughts 0 - 0 0 0  PHQ-9 Score 0 - 0 0 3  Difficult doing work/chores - - - Not difficult at all Not difficult at all  Some recent data might be hidden    phq 9 is negative   Fall Risk: Fall Risk  10/19/2019 10/19/2019 06/18/2019 04/30/2019  02/16/2019  Falls in the past year? 0 0 0 0 0  Number falls in past yr: 0 0 0 0 0  Injury with Fall? 0 0 0 0 0  Risk for fall due to : - No Fall Risks - - -  Risk for fall due to: Comment - - - - -  Follow up - Falls prevention discussed - - -     Functional Status Survey: Is the patient deaf or have difficulty hearing?: No Does the patient have difficulty seeing, even when wearing glasses/contacts?: No Does the patient have difficulty concentrating, remembering, or making decisions?: No Does the patient have difficulty walking or climbing stairs?: No Does the patient have difficulty dressing or bathing?: No Does the patient have difficulty doing errands alone such as visiting a doctor's office or shopping?: No   Assessment & Plan   1. Type 2 diabetes mellitus with microalbuminuria, without long-term current use of insulin (HCC)  - POCT HgB A1C - losartan (COZAAR) 50 MG tablet; Take 1 tablet (50 mg total) by mouth daily.  Dispense: 90 tablet; Refill: 1 - metFORMIN (GLUCOPHAGE-XR) 500 MG 24 hr tablet; Take 1 tablet (500 mg total) by mouth daily with breakfast.  Dispense: 90 tablet; Refill: 1  2. Essential hypertension  - losartan (COZAAR) 50 MG tablet; Take 1 tablet (50 mg total) by mouth daily.  Dispense: 90 tablet; Refill: 1 - carvedilol (COREG) 6.25 MG tablet; Take 1 tablet (6.25 mg total) by mouth 2 (two) times daily.  Dispense: 180 tablet; Refill: 1  3. Gastroesophageal reflux disease without esophagitis  Resume omeprazole  4. Dyslipidemia  - rosuvastatin (CRESTOR) 40 MG tablet; Take 1 tablet (40 mg total) by mouth daily.  Dispense: 90 tablet; Refill: 2  5. Arteriosclerosis of coronary artery   6. Mild intermittent asthma without complication   7. Insomnia, persistent  - zolpidem (AMBIEN) 10 MG tablet; TAKE 1 TABLET BY MOUTH EVERYDAY AT BEDTIME  Dispense: 90  tablet; Refill: 0  8. Esophageal dysphagia  She wants to hold off on seeing GI at this time  9. History  of acute myocardial infarction  - losartan (COZAAR) 50 MG tablet; Take 1 tablet (50 mg total) by mouth daily.  Dispense: 90 tablet; Refill: 1 - carvedilol (COREG) 6.25 MG tablet; Take 1 tablet (6.25 mg total) by mouth 2 (two) times daily.  Dispense: 180 tablet; Refill: 1 - rosuvastatin (CRESTOR) 40 MG tablet; Take 1 tablet (40 mg total) by mouth daily.  Dispense: 90 tablet; Refill: 2  10. Genital herpes in women  - ezetimibe (ZETIA) 10 MG tablet; Take 1 tablet (10 mg total) by mouth daily.  Dispense: 90 tablet; Refill: 1

## 2019-10-19 NOTE — Patient Instructions (Signed)
Dawn Werner , Thank you for taking time to come for your Medicare Wellness Visit. I appreciate your ongoing commitment to your health goals. Please review the following plan we discussed and let me know if I can assist you in the future.   Screening recommendations/referrals: Colonoscopy: done 06/18/13 Mammogram: done 03/26/19 Bone Density: done 2008 Recommended yearly ophthalmology/optometry visit for glaucoma screening and checkup Recommended yearly dental visit for hygiene and checkup  Vaccinations: Influenza vaccine: done 02/16/19 Pneumococcal vaccine: done 01/10/16 Tdap vaccine: done 01/29/17 Shingles vaccine: Shingrix discussed. Please contact your pharmacy for coverage information.  Covid-19: done 07/05/19 & 08/02/19   Advanced directives: Please bring a copy of your health care power of attorney and living will to the office at your convenience once you have completed those documents.   Conditions/risks identified: Keep up the great work!  Next appointment: Please follow up in one year for your Medicare Annual Wellness visit.     Preventive Care 70 Years and Older, Female Preventive care refers to lifestyle choices and visits with your health care provider that can promote health and wellness. What does preventive care include?  A yearly physical exam. This is also called an annual well check.  Dental exams once or twice a year.  Routine eye exams. Ask your health care provider how often you should have your eyes checked.  Personal lifestyle choices, including:  Daily care of your teeth and gums.  Regular physical activity.  Eating a healthy diet.  Avoiding tobacco and drug use.  Limiting alcohol use.  Practicing safe sex.  Taking low-dose aspirin every day.  Taking vitamin and mineral supplements as recommended by your health care provider. What happens during an annual well check? The services and screenings done by your health care provider during your annual  well check will depend on your age, overall health, lifestyle risk factors, and family history of disease. Counseling  Your health care provider may ask you questions about your:  Alcohol use.  Tobacco use.  Drug use.  Emotional well-being.  Home and relationship well-being.  Sexual activity.  Eating habits.  History of falls.  Memory and ability to understand (cognition).  Work and work Astronomer.  Reproductive health. Screening  You may have the following tests or measurements:  Height, weight, and BMI.  Blood pressure.  Lipid and cholesterol levels. These may be checked every 5 years, or more frequently if you are over 44 years old.  Skin check.  Lung cancer screening. You may have this screening every year starting at age 70 if you have a 30-pack-year history of smoking and currently smoke or have quit within the past 15 years.  Fecal occult blood test (FOBT) of the stool. You may have this test every year starting at age 70.  Flexible sigmoidoscopy or colonoscopy. You may have a sigmoidoscopy every 5 years or a colonoscopy every 10 years starting at age 70.  Hepatitis C blood test.  Hepatitis B blood test.  Sexually transmitted disease (STD) testing.  Diabetes screening. This is done by checking your blood sugar (glucose) after you have not eaten for a while (fasting). You may have this done every 1-3 years.  Bone density scan. This is done to screen for osteoporosis. You may have this done starting at age 18.  Mammogram. This may be done every 1-2 years. Talk to your health care provider about how often you should have regular mammograms. Talk with your health care provider about your test results, treatment options, and  if necessary, the need for more tests. Vaccines  Your health care provider may recommend certain vaccines, such as:  Influenza vaccine. This is recommended every year.  Tetanus, diphtheria, and acellular pertussis (Tdap, Td) vaccine.  You may need a Td booster every 10 years.  Zoster vaccine. You may need this after age 25.  Pneumococcal 13-valent conjugate (PCV13) vaccine. One dose is recommended after age 20.  Pneumococcal polysaccharide (PPSV23) vaccine. One dose is recommended after age 48. Talk to your health care provider about which screenings and vaccines you need and how often you need them. This information is not intended to replace advice given to you by your health care provider. Make sure you discuss any questions you have with your health care provider. Document Released: 06/23/2015 Document Revised: 02/14/2016 Document Reviewed: 03/28/2015 Elsevier Interactive Patient Education  2017 [place] Prevention in the Home Falls can cause injuries. They can happen to people of all ages. There are many things you can do to make your home safe and to help prevent falls. What can I do on the outside of my home?  Regularly fix the edges of walkways and driveways and fix any cracks.  Remove anything that might make you trip as you walk through a door, such as a raised step or threshold.  Trim any bushes or trees on the path to your home.  Use bright outdoor lighting.  Clear any walking paths of anything that might make someone trip, such as rocks or tools.  Regularly check to see if handrails are loose or broken. Make sure that both sides of any steps have handrails.  Any raised decks and porches should have guardrails on the edges.  Have any leaves, snow, or ice cleared regularly.  Use sand or salt on walking paths during winter.  Clean up any spills in your garage right away. This includes oil or grease spills. What can I do in the bathroom?  Use night lights.  Install grab bars by the toilet and in the tub and shower. Do not use towel bars as grab bars.  Use non-skid mats or decals in the tub or shower.  If you need to sit down in the shower, use a plastic, non-slip stool.  Keep the  floor dry. Clean up any water that spills on the floor as soon as it happens.  Remove soap buildup in the tub or shower regularly.  Attach bath mats securely with double-sided non-slip rug tape.  Do not have throw rugs and other things on the floor that can make you trip. What can I do in the bedroom?  Use night lights.  Make sure that you have a light by your bed that is easy to reach.  Do not use any sheets or blankets that are too big for your bed. They should not hang down onto the floor.  Have a firm chair that has side arms. You can use this for support while you get dressed.  Do not have throw rugs and other things on the floor that can make you trip. What can I do in the kitchen?  Clean up any spills right away.  Avoid walking on wet floors.  Keep items that you use a lot in easy-to-reach places.  If you need to reach something above you, use a strong step stool that has a grab bar.  Keep electrical cords out of the way.  Do not use floor polish or wax that makes floors slippery. If you  must use wax, use non-skid floor wax.  Do not have throw rugs and other things on the floor that can make you trip. What can I do with my stairs?  Do not leave any items on the stairs.  Make sure that there are handrails on both sides of the stairs and use them. Fix handrails that are broken or loose. Make sure that handrails are as long as the stairways.  Check any carpeting to make sure that it is firmly attached to the stairs. Fix any carpet that is loose or worn.  Avoid having throw rugs at the top or bottom of the stairs. If you do have throw rugs, attach them to the floor with carpet tape.  Make sure that you have a light switch at the top of the stairs and the bottom of the stairs. If you do not have them, ask someone to add them for you. What else can I do to help prevent falls?  Wear shoes that:  Do not have high heels.  Have rubber bottoms.  Are comfortable and fit  you well.  Are closed at the toe. Do not wear sandals.  If you use a stepladder:  Make sure that it is fully opened. Do not climb a closed stepladder.  Make sure that both sides of the stepladder are locked into place.  Ask someone to hold it for you, if possible.  Clearly mark and make sure that you can see:  Any grab bars or handrails.  First and last steps.  Where the edge of each step is.  Use tools that help you move around (mobility aids) if they are needed. These include:  Canes.  Walkers.  Scooters.  Crutches.  Turn on the lights when you go into a dark area. Replace any light bulbs as soon as they burn out.  Set up your furniture so you have a clear path. Avoid moving your furniture around.  If any of your floors are uneven, fix them.  If there are any pets around you, be aware of where they are.  Review your medicines with your doctor. Some medicines can make you feel dizzy. This can increase your chance of falling. Ask your doctor what other things that you can do to help prevent falls. This information is not intended to replace advice given to you by your health care provider. Make sure you discuss any questions you have with your health care provider. Document Released: 03/23/2009 Document Revised: 11/02/2015 Document Reviewed: 07/01/2014 Elsevier Interactive Patient Education  2017 [employer].

## 2019-10-29 ENCOUNTER — Telehealth: Payer: Self-pay | Admitting: Family Medicine

## 2019-10-29 NOTE — Chronic Care Management (AMB) (Signed)
  Chronic Care Management   Note  10/29/2019 Name: Dawn Werner MRN: [id number] DOB: 06-12-1950  Dawn Werner is a 69 y.o. year old female who is a primary care patient of Steele Sizer, MD. I reached out to [place] by phone today in response to a referral sent by Dawn Werner's health plan.     [name] was given information about Chronic Care Management services today including:  1. CCM service includes personalized support from designated clinical staff supervised by her physician, including individualized plan of care and coordination with other care providers 2. 24/7 contact phone numbers for assistance for urgent and routine care needs. 3. Service will only be billed when office clinical staff spend 20 minutes or more in a month to coordinate care. 4. Only one practitioner may furnish and bill the service in a calendar month. 5. The patient may stop CCM services at any time (effective at the end of the month) by phone call to the office staff. 6. The patient will be responsible for cost sharing (co-pay) of up to 20% of the service fee (after annual deductible is met).  Patient did not agree to enrollment in care management services and does not wish to consider at this time.  Follow up plan: The patient has been provided with contact information for the care management team and has been advised to call with any health related questions or concerns.   Noreene Larsson, [place], [place], [place] [address] Direct Dial: [phone] [email or web address]: [email or web address]

## 2019-12-10 ENCOUNTER — Other Ambulatory Visit: Payer: Self-pay | Admitting: Family Medicine

## 2019-12-10 DIAGNOSIS — Z780 Asymptomatic menopausal state: Secondary | ICD-10-CM

## 2020-02-24 DIAGNOSIS — M25552 Pain in left hip: Secondary | ICD-10-CM | POA: Diagnosis not present

## 2020-02-24 DIAGNOSIS — M1612 Unilateral primary osteoarthritis, left hip: Secondary | ICD-10-CM | POA: Diagnosis not present

## 2020-02-28 ENCOUNTER — Other Ambulatory Visit: Payer: Self-pay | Admitting: Family Medicine

## 2020-02-28 DIAGNOSIS — G4700 Insomnia, unspecified: Secondary | ICD-10-CM

## 2020-03-02 ENCOUNTER — Other Ambulatory Visit: Payer: Self-pay | Admitting: Family Medicine

## 2020-03-02 DIAGNOSIS — Z1231 Encounter for screening mammogram for malignant neoplasm of breast: Secondary | ICD-10-CM

## 2020-03-03 ENCOUNTER — Ambulatory Visit: Payer: Medicare Other | Admitting: Family Medicine

## 2020-03-20 ENCOUNTER — Encounter: Payer: Self-pay | Admitting: Family Medicine

## 2020-03-20 ENCOUNTER — Ambulatory Visit (INDEPENDENT_AMBULATORY_CARE_PROVIDER_SITE_OTHER): Payer: Medicare Other | Admitting: Family Medicine

## 2020-03-20 ENCOUNTER — Other Ambulatory Visit: Payer: Self-pay

## 2020-03-20 VITALS — BP 130/78 | HR 68 | Temp 98.1°F | Resp 16 | Ht 62.0 in | Wt 162.3 lb

## 2020-03-20 DIAGNOSIS — G4700 Insomnia, unspecified: Secondary | ICD-10-CM

## 2020-03-20 DIAGNOSIS — Z23 Encounter for immunization: Secondary | ICD-10-CM | POA: Diagnosis not present

## 2020-03-20 DIAGNOSIS — K219 Gastro-esophageal reflux disease without esophagitis: Secondary | ICD-10-CM

## 2020-03-20 DIAGNOSIS — I252 Old myocardial infarction: Secondary | ICD-10-CM

## 2020-03-20 DIAGNOSIS — E785 Hyperlipidemia, unspecified: Secondary | ICD-10-CM | POA: Diagnosis not present

## 2020-03-20 DIAGNOSIS — E1129 Type 2 diabetes mellitus with other diabetic kidney complication: Secondary | ICD-10-CM

## 2020-03-20 DIAGNOSIS — R809 Proteinuria, unspecified: Secondary | ICD-10-CM

## 2020-03-20 DIAGNOSIS — I2510 Atherosclerotic heart disease of native coronary artery without angina pectoris: Secondary | ICD-10-CM

## 2020-03-20 DIAGNOSIS — A6009 Herpesviral infection of other urogenital tract: Secondary | ICD-10-CM

## 2020-03-20 DIAGNOSIS — J4520 Mild intermittent asthma, uncomplicated: Secondary | ICD-10-CM

## 2020-03-20 DIAGNOSIS — I10 Essential (primary) hypertension: Secondary | ICD-10-CM

## 2020-03-20 LAB — POCT GLYCOSYLATED HEMOGLOBIN (HGB A1C): Hemoglobin A1C: 7.1 % — AB (ref 4.0–5.6)

## 2020-03-20 MED ORDER — EZETIMIBE 10 MG PO TABS: 10.0000 mg | ORAL_TABLET | Freq: Every day | ORAL | 1 refills | Status: DC

## 2020-03-20 MED ORDER — METFORMIN HCL ER 500 MG PO TB24: 500.0000 mg | ORAL_TABLET | Freq: Every day | ORAL | 1 refills | Status: DC

## 2020-03-20 MED ORDER — METFORMIN HCL ER 750 MG PO TB24: 750.0000 mg | ORAL_TABLET | Freq: Every day | ORAL | 1 refills | Status: DC

## 2020-03-20 MED ORDER — LOSARTAN POTASSIUM 50 MG PO TABS: 50.0000 mg | ORAL_TABLET | Freq: Every day | ORAL | 1 refills | Status: DC

## 2020-03-20 MED ORDER — CARVEDILOL 6.25 MG PO TABS: 6.2500 mg | ORAL_TABLET | Freq: Two times a day (BID) | ORAL | 1 refills | Status: DC

## 2020-03-20 NOTE — Progress Notes (Signed)
Name: Dawn Werner   MRN: [id number]    DOB: [birth year]   Date:03/20/2020       Progress Note  Subjective  Chief Complaint  Chief Complaint  Patient presents with  . Follow-up  . Diabetes    HPI  DMII: HerhgbA1C went down from 7.8% to 7.3%, up to 7.4%, 7.3%,6.9%, 6.8%,7.5%6.8%downto 6.5% , 6.4 % and today is 7.1%, she states she started baking again and has been eating more sweets. Shehas history of nephropathy and is on ARB, no neuropathyShe denies polyphagia, polydipsia or polyuria. She states her meals are still healthy but is eating more bread and also sweets, needs to avoid sweets We will also increase Metformin from 500 mg ER to 750 mg ER  OA left hip: she had steroid injection on left trochanteric bursa on 02/2020, pain has improved, it may have been the cause of increase in A1C  Hyperlipidemia: she is back on Crestor 40 mgand Zetia 10 mg dailyDenies myalgias , we will recheck it yearly   Insomnia: she was trying to wean self off of Ambien, she has 10 mg tablets, and currently taking abite of the pill  but states Ambien 5 mg is more expensiveand not enough, she thinks she is taking about 7 mg daily, she called for a refill last week, she states she is unable to sleep without medication. She states she has been stressed, her son left his wife after 2 years, her daughter is moving to [place] for her job  HTN: No chest pain, palpitation or SOB , taking medication as prescribed. BP at home has been 130's/70's in am's  CAD: no episodes, s/p history of MI in 2009, taking aspirin daily, beta-blocker and statin therapy. No chest pain or  decrease in exercise tolerance.Doing well   Asthma Mild Intermittent:she has been doing well, no cough, wheezing or SOB lately, she has not been using Breo or rescue inhaler in a while  Atherosclerosis of aorta: on statin and aspirin, recheck labs next visit   Dysphagia: she states symptoms resolved with Omeprazole,  doing well at this time   Patient Active Problem List   Diagnosis Date Noted  . Belching 08/18/2018  . 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 Derangement of posterior horn of lateral meniscus of right knee 01/10/2016  . 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 Primary osteoarthritis of knee 01/24/2015  . Asthma, mild intermittent 11/18/2014  . Arteriosclerosis of coronary artery 11/18/2014  . Insomnia, persistent 11/18/2014  . Dyslipidemia 11/18/2014  . Acid reflux 11/18/2014  . Diabetes type 2, controlled ([place]) 11/18/2014  . Genital herpes in women 11/18/2014  . Hypertension 11/18/2014  . Asymptomatic postmenopausal status 11/18/2014  . History of acute myocardial infarction 11/18/2014    Past Surgical History:  Procedure Laterality Date  . ABDOMINAL HYSTERECTOMY    . BREAST SURGERY     breast reduction  . REDUCTION MAMMAPLASTY Bilateral 1995  . TUBAL LIGATION      Family History  Problem Relation Age of Onset  . Hyperlipidemia Mother   . Hypertension Mother   . Stroke Mother   . Dementia Father   . Cancer Sister        breast  . Breast cancer Sister 44  . Asthma Brother   . Depression Brother        bipolar  . Heart disease Brother   . Heart disease Brother     Social History   Tobacco Use  . Smoking status: Never Smoker  . Smokeless tobacco:  Never Used  Substance Use Topics  . Alcohol use: No    Alcohol/week: 0.0 standard drinks     Current Outpatient Medications:  .  ACCU-CHEK GUIDE test strip, USE TO TEST BLOOD SUGAR UP TO 4 TIMES A DAY AS DIRECTED, Disp: 100 strip, Rfl: 11 .  Accu-Chek Softclix Lancets lancets, USE TO TEST BLOOD SUGAR UP TO 4 TIMES A DAY AS DIRECTED, Disp: 100 each, Rfl: 0 .  albuterol (PROVENTIL HFA) 108 (90 BASE) MCG/ACT inhaler, Inhale 2 puffs into the lungs as needed., Disp: , Rfl:  .  aspirin EC 81 MG tablet, Take 1 tablet by mouth daily., Disp: , Rfl:  .  Blood Glucose Monitoring Suppl (ACCU-CHEK GUIDE ME) w/Device KIT, USE AS DIRECTED,  Disp: 1 kit, Rfl: 0 .  carvedilol (COREG) 6.25 MG tablet, Take 1 tablet (6.25 mg total) by mouth 2 (two) times daily., Disp: 180 tablet, Rfl: 1 .  colchicine 0.6 MG tablet, Take 1 tablet (0.6 mg total) by mouth daily. Take two at onset of gout and repeat in 1 hour, prn, Disp: 30 tablet, Rfl: 0 .  ezetimibe (ZETIA) 10 MG tablet, Take 1 tablet (10 mg total) by mouth daily., Disp: 90 tablet, Rfl: 1 .  losartan (COZAAR) 50 MG tablet, Take 1 tablet (50 mg total) by mouth daily., Disp: 90 tablet, Rfl: 1 .  metFORMIN (GLUCOPHAGE-XR) 750 MG 24 hr tablet, Take 1 tablet (750 mg total) by mouth daily with breakfast., Disp: 90 tablet, Rfl: 1 .  omeprazole (PRILOSEC) 40 MG capsule, Take 1 capsule (40 mg total) by mouth daily., Disp: 90 capsule, Rfl: 1 .  rosuvastatin (CRESTOR) 40 MG tablet, Take 1 tablet (40 mg total) by mouth daily., Disp: 90 tablet, Rfl: 2 .  zolpidem (AMBIEN) 10 MG tablet, TAKE 1 TABLET BY MOUTH EVERYDAY AT BEDTIME DNF UNTIL 12/15/19, Disp: 90 tablet, Rfl: 0  Allergies  Allergen Reactions  . Metformin And Related Diarrhea    And nausea. Tolerates XR formulation    I personally reviewed active problem list, medication list, allergies, family history, social history, health maintenance with the patient/caregiver today.   ROS  Constitutional: Negative for fever or weight change.  Respiratory: Negative for cough and shortness of breath.   Cardiovascular: Negative for chest pain or palpitations.  Gastrointestinal: Negative for abdominal pain, no bowel changes.  Musculoskeletal: Negative for gait problem or joint swelling.  Skin: Negative for rash.  Neurological: Negative for dizziness or headache.  No other specific complaints in a complete review of systems (except as listed in HPI above).  Objective  Vitals:   03/20/20 1033  BP: 130/78  Pulse: 68  Resp: 16  Temp: 98.1 F (36.7 C)  TempSrc: Oral  SpO2: 99%  Weight: 162 lb 4.8 oz (73.6 kg)  Height: 5' 2"  (1.575 m)     Body mass index is 29.69 kg/m.  Physical Exam  Constitutional: Patient appears well-developed and well-nourished. Overweight. No distress.  HEENT: head atraumatic, normocephalic, pupils equal and reactive to light,  neck supple Cardiovascular: Normal rate, regular rhythm and normal heart sounds.  No murmur heard. No BLE edema. Pulmonary/Chest: Effort normal and breath sounds normal. No respiratory distress. Abdominal: Soft.  There is no tenderness. Psychiatric: Patient has a normal mood and affect. behavior is normal. Judgment and thought content normal.  Diabetic Foot Exam: Diabetic Foot Exam - Simple   Simple Foot Form Diabetic Foot exam was performed with the following findings: Yes 03/20/2020 10:48 AM  Visual Inspection No deformities, no  ulcerations, no other skin breakdown bilaterally: Yes Sensation Testing Intact to touch and monofilament testing bilaterally: Yes Pulse Check Posterior Tibialis and Dorsalis pulse intact bilaterally: Yes Comments      PHQ2/9: Depression screen [facility] 2/9 03/20/2020 10/19/2019 10/19/2019 06/18/2019 04/30/2019  Decreased Interest 0 0 0 0 0  Down, Depressed, Hopeless 0 0 0 0 0  PHQ - 2 Score 0 0 0 0 0  Altered sleeping - 0 - 0 0  Tired, decreased energy - 0 - 0 0  Change in appetite - 0 - 0 0  Feeling bad or failure about yourself  - 0 - 0 0  Trouble concentrating - 0 - 0 0  Moving slowly or fidgety/restless - 0 - 0 0  Suicidal thoughts - 0 - 0 0  PHQ-9 Score - 0 - 0 0  Difficult doing work/chores - - - - Not difficult at all  Some recent data might be hidden    phq 9 is negative   Fall Risk: Fall Risk  03/20/2020 10/19/2019 10/19/2019 06/18/2019 04/30/2019  Falls in the past year? 0 0 0 0 0  Number falls in past yr: 0 0 0 0 0  Injury with Fall? 0 0 0 0 0  Risk for fall due to : - - No Fall Risks - -  Risk for fall due to: Comment - - - - -  Follow up - - Falls prevention discussed - -     Assessment & Plan  1. Type 2 diabetes  mellitus with microalbuminuria, without long-term current use of insulin (HCC)  - HM Diabetes Foot Exam - POCT HgB A1C - losartan (COZAAR) 50 MG tablet; Take 1 tablet (50 mg total) by mouth daily.  Dispense: 90 tablet; Refill: 1 - metFORMIN (GLUCOPHAGE-XR) 750 MG 24 hr tablet; Take 1 tablet (750 mg total) by mouth daily with breakfast.  Dispense: 90 tablet; Refill: 1  2. Dyslipidemia   3. Insomnia, persistent   4. Gastroesophageal reflux disease without esophagitis  Doing well  5. Essential hypertension  - losartan (COZAAR) 50 MG tablet; Take 1 tablet (50 mg total) by mouth daily.  Dispense: 90 tablet; Refill: 1 - carvedilol (COREG) 6.25 MG tablet; Take 1 tablet (6.25 mg total) by mouth 2 (two) times daily.  Dispense: 180 tablet; Refill: 1  6. Arteriosclerosis of coronary artery   7. Mild intermittent asthma without complication   8. History of acute myocardial infarction  - losartan (COZAAR) 50 MG tablet; Take 1 tablet (50 mg total) by mouth daily.  Dispense: 90 tablet; Refill: 1 - carvedilol (COREG) 6.25 MG tablet; Take 1 tablet (6.25 mg total) by mouth 2 (two) times daily.  Dispense: 180 tablet; Refill: 1  9. Genital herpes in women  - ezetimibe (ZETIA) 10 MG tablet; Take 1 tablet (10 mg total) by mouth daily.  Dispense: 90 tablet; Refill: 1  10. Need for immunization against influenza  - Flu Vaccine QUAD High Dose(Fluad)

## 2020-03-27 ENCOUNTER — Other Ambulatory Visit: Payer: Self-pay | Admitting: Family Medicine

## 2020-03-27 DIAGNOSIS — K219 Gastro-esophageal reflux disease without esophagitis: Secondary | ICD-10-CM

## 2020-03-28 ENCOUNTER — Ambulatory Visit
Admission: RE | Admit: 2020-03-28 | Discharge: 2020-03-28 | Disposition: A | Discharge status: Home / Self Care | Payer: Medicare Other | Source: Ambulatory Visit | Attending: Family Medicine | Admitting: Family Medicine

## 2020-03-28 ENCOUNTER — Other Ambulatory Visit: Payer: Self-pay

## 2020-03-28 DIAGNOSIS — Z1231 Encounter for screening mammogram for malignant neoplasm of breast: Secondary | ICD-10-CM | POA: Insufficient documentation

## 2020-04-18 IMAGING — CR DG CHEST 2V
1 series · 2 of 2 positions shown · non-contrast
Comparison: 05/11/2008.

CLINICAL DATA: Left lower chest pain for the past month. No known
injury.

EXAM:
CHEST - 2 VIEW

[Series 1: dg chest 2 view · 0.14mm/px · 2 of 2 slices shown]
[im 1/2]
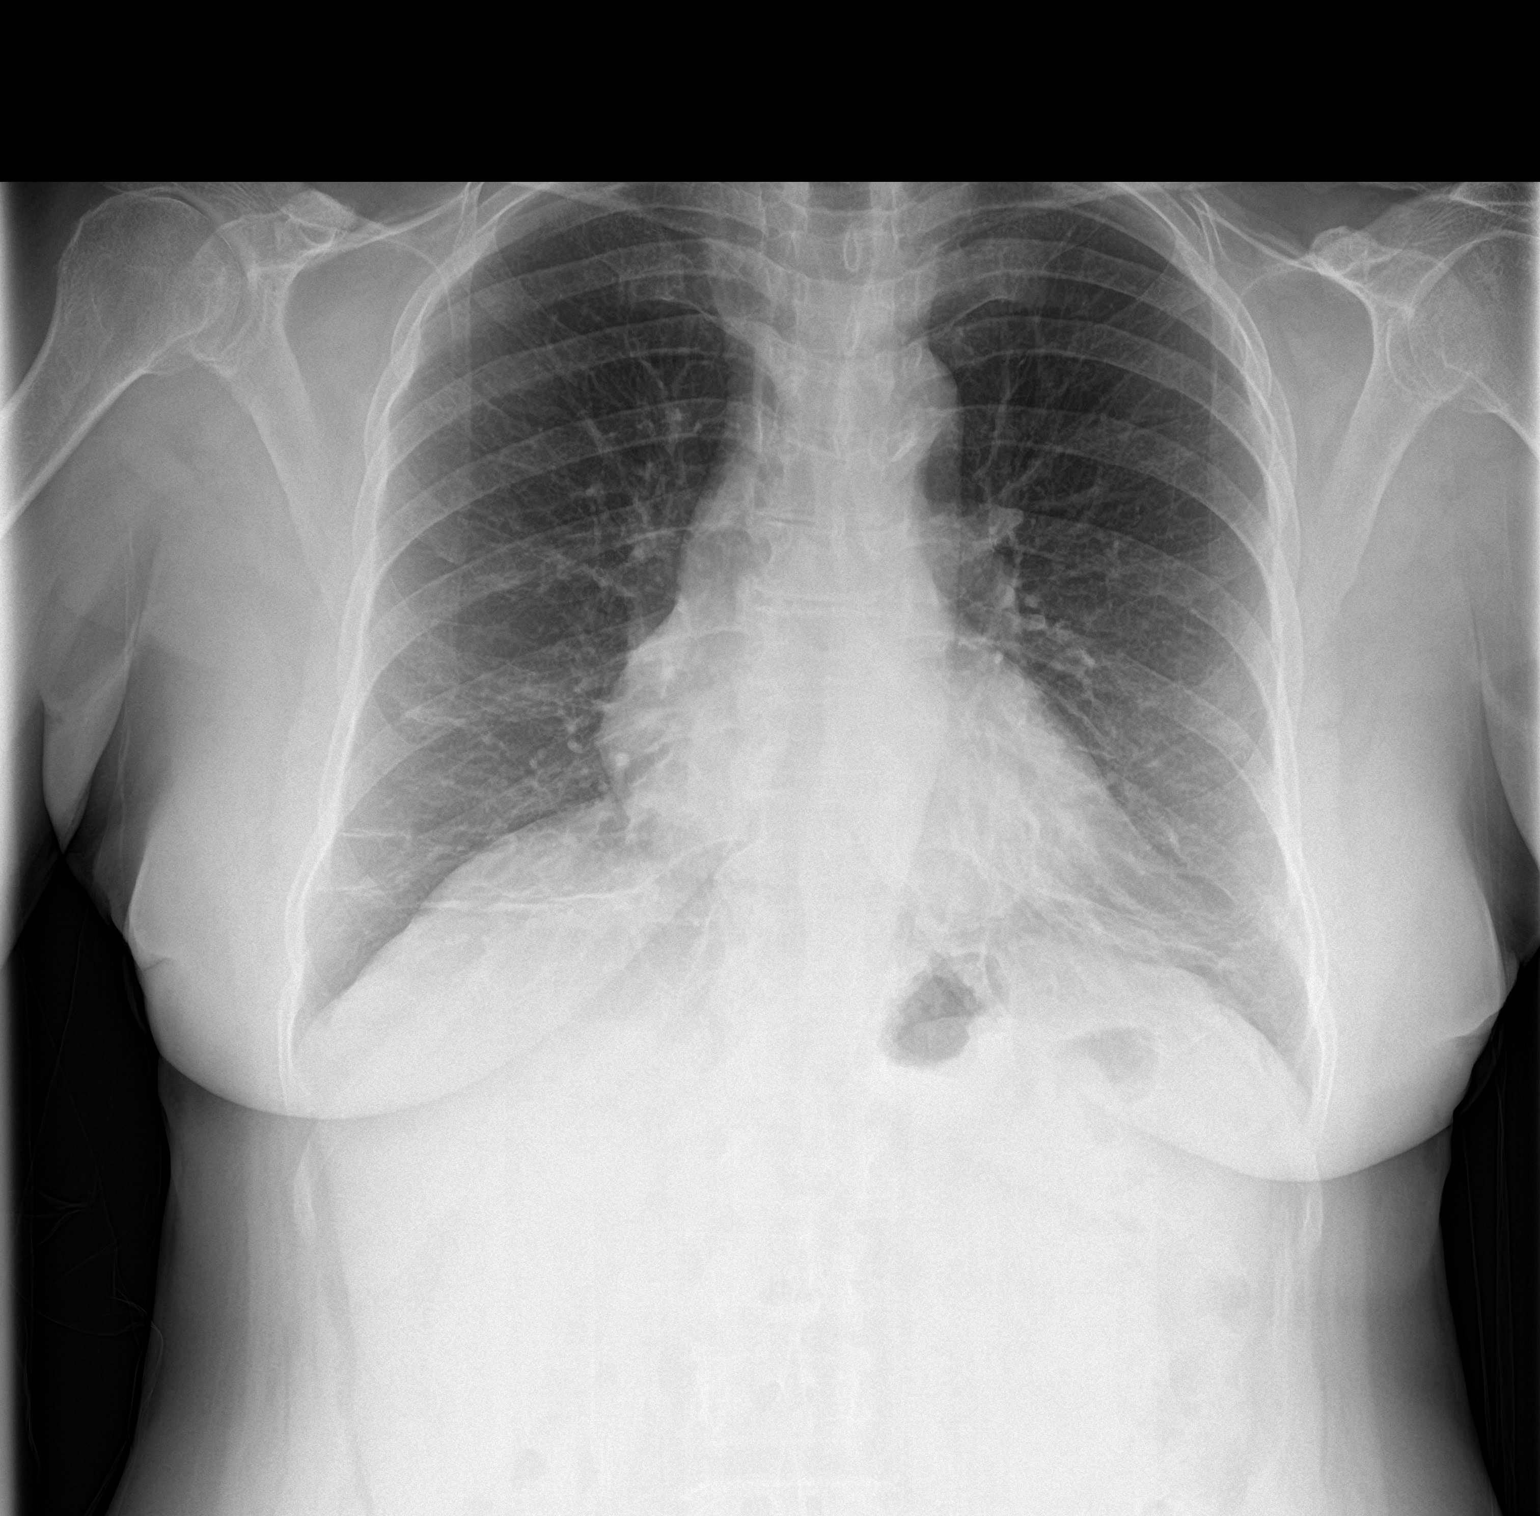
[im 2/2]
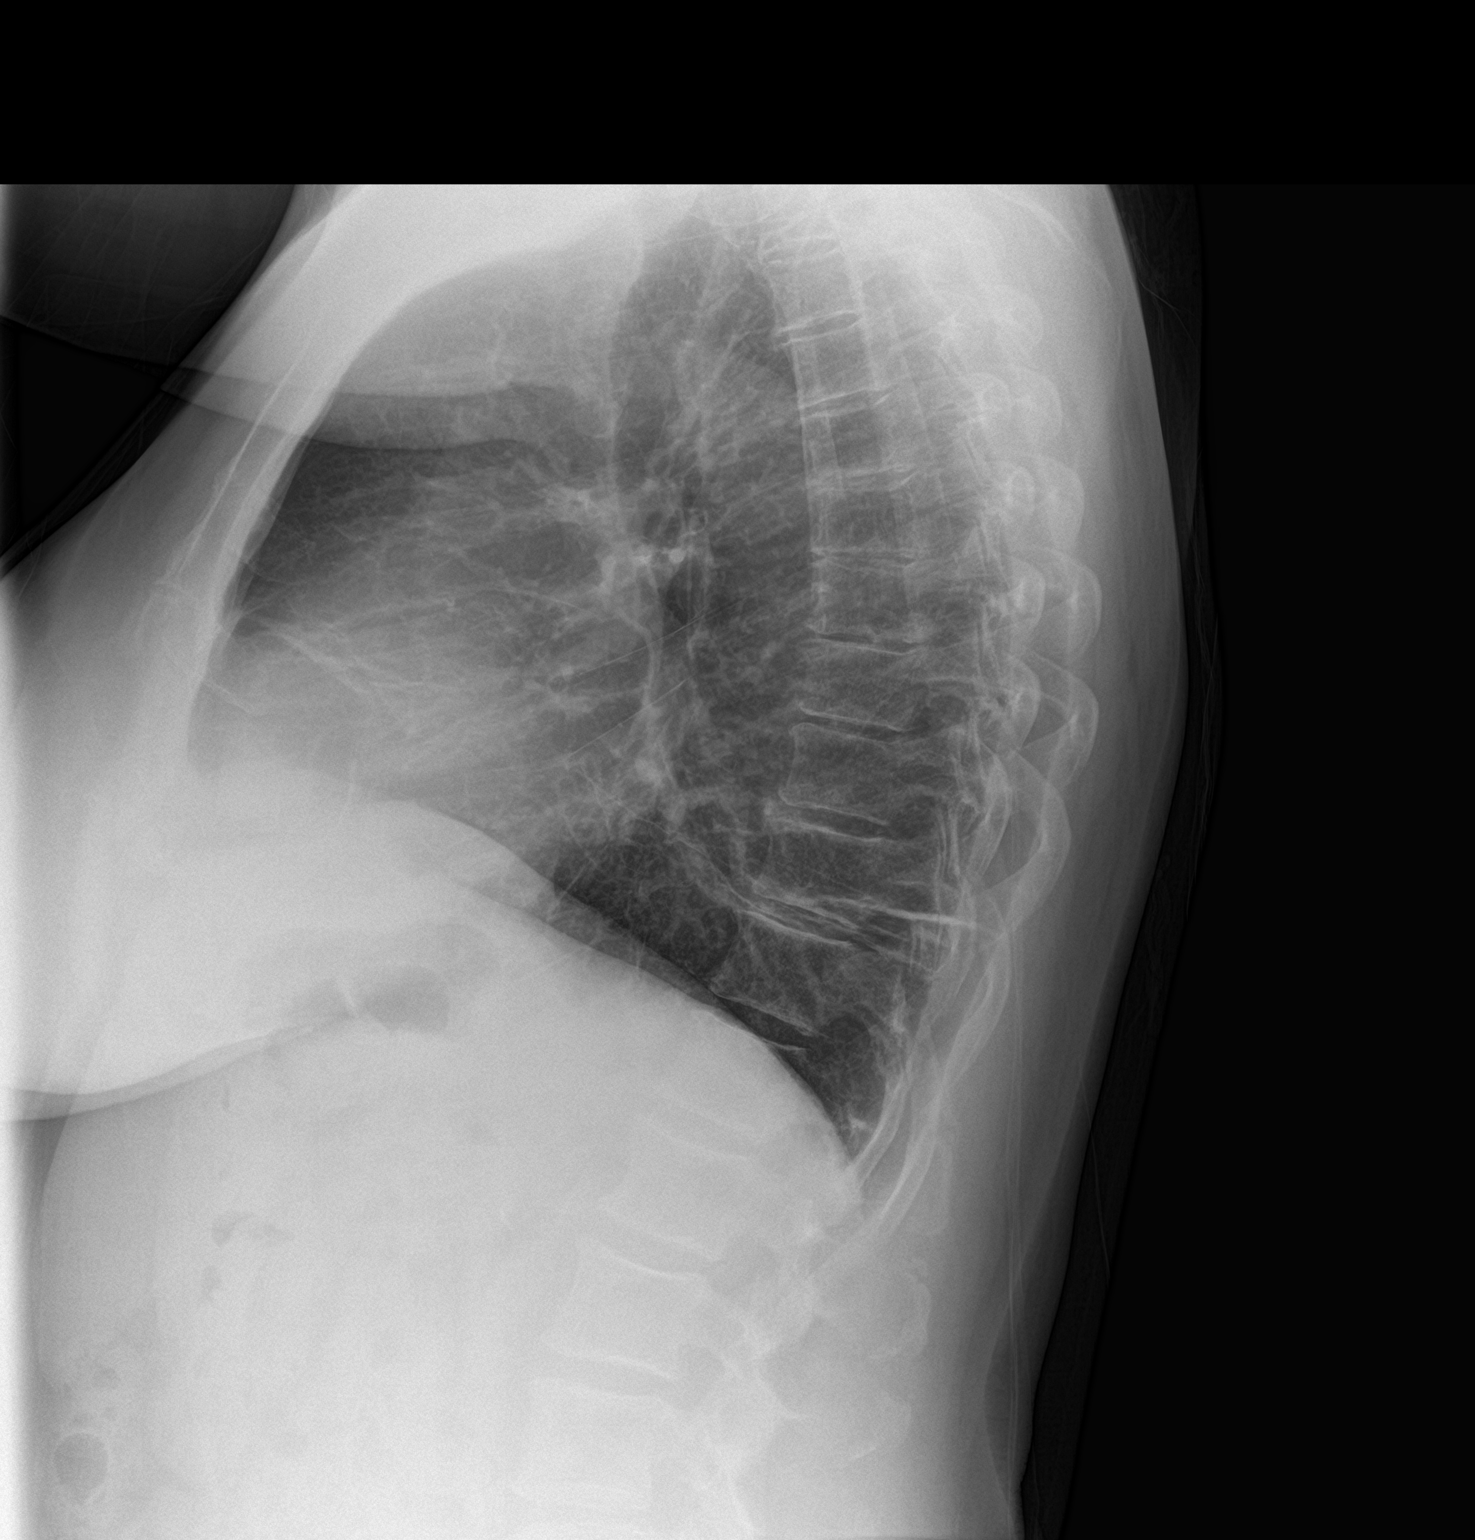

[2 of 2 positions shown; findings below may reference images not displayed]

FINDINGS: Normal sized heart. Stable bibasilar linear scarring. Otherwise,
clear lungs. Thoracic spine degenerative changes.
IMPRESSION: No acute abnormality.

## 2020-07-21 NOTE — Progress Notes (Signed)
Name: Dawn Werner   MRN: [id number]    DOB: [birth year]   Date:07/24/2020       Progress Note  Subjective  Chief Complaint  Follow Up  HPI  DMII: HerhgbA1C went down from 7.8% to 7.3%, up to 7.4%, 7.3%,6.9%, 6.8%,7.5%6.8%downto 6.5% , 6.4 % 7.1%, today is down to 6.9 % she is not longer baking frequently, however this am glucose was up to 177 due to splurging on super bowl Sunday. Shehas history of nephropathy and is on ARB, no neuropathyShe denies polyphagia, polydipsia or polyuria. She is taking Metformin ER to 750 mg ER without side effects. Compliant with statin therapy   OA left hip: she had steroid injection on left trochanteric bursa on 02/2020, pain has improved, she does not want surgery at this time.   Hyperlipidemia: she is back on Crestor 40 mgand Zetia 10 mg dailyDenies myalgias We will recheck labs   Insomnia: she has finally stopped taking Ambien, last dose day before Thanksgiving 2021. She states it takes time to fall asleep but feels happier and is okay with not having medication   HTN: No chest pain, palpitation or SOB , taking medication as prescribed. BP towards low end of normal , but no dizziness   CAD: no episodes, s/p history of MI in 2009, taking aspirin daily, beta-blocker and statin therapy. No chest pain or  decrease in exercise tolerance.We will recheck labs, doing well   Asthma Mild Intermittent:she has been doing well, no cough, wheezing or SOB lately, she has not been using Breo or rescue inhaler in a while. COVID , pneumonia and flu vaccines up to date   Atherosclerosis of aorta: on statin and aspirin, recheck labs today   GERD: doing well on Omeprazole   Patient Active Problem List   Diagnosis Date Noted  . Diabetes mellitus with coincident hypertension ([place]) 07/24/2020  . Belching 08/18/2018  . 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 Derangement of posterior horn of lateral meniscus of right knee 01/10/2016  .  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 Primary osteoarthritis of knee 01/24/2015  . Asthma, mild intermittent 11/18/2014  . Arteriosclerosis of coronary artery 11/18/2014  . Insomnia, persistent 11/18/2014  . Dyslipidemia 11/18/2014  . Acid reflux 11/18/2014  . Diabetes type 2, controlled ([place]) 11/18/2014  . Genital herpes in women 11/18/2014  . Hypertension 11/18/2014  . Asymptomatic postmenopausal status 11/18/2014  . History of acute myocardial infarction 11/18/2014    Past Surgical History:  Procedure Laterality Date  . ABDOMINAL HYSTERECTOMY    . BREAST SURGERY     breast reduction  . REDUCTION MAMMAPLASTY Bilateral 1995  . TUBAL LIGATION      Family History  Problem Relation Age of Onset  . Hyperlipidemia Mother   . Hypertension Mother   . Stroke Mother   . Dementia Father   . Cancer Sister        breast  . Breast cancer Sister 77  . Asthma Brother   . Depression Brother        bipolar  . Heart disease Brother   . Heart disease Brother     Social History   Tobacco Use  . Smoking status: Never Smoker  . Smokeless tobacco: Never Used  Substance Use Topics  . Alcohol use: No    Alcohol/week: 0.0 standard drinks     Current Outpatient Medications:  .  ACCU-CHEK GUIDE test strip, USE TO TEST BLOOD SUGAR UP TO 4 TIMES A DAY AS DIRECTED, Disp: 100 strip, Rfl:  11 .  Accu-Chek Softclix Lancets lancets, USE TO TEST BLOOD SUGAR UP TO 4 TIMES A DAY AS DIRECTED, Disp: 100 each, Rfl: 0 .  albuterol (VENTOLIN HFA) 108 (90 Base) MCG/ACT inhaler, Inhale 2 puffs into the lungs as needed., Disp: , Rfl:  .  aspirin EC 81 MG tablet, Take 1 tablet by mouth daily., Disp: , Rfl:  .  Blood Glucose Monitoring Suppl (ACCU-CHEK GUIDE ME) w/Device KIT, USE AS DIRECTED, Disp: 1 kit, Rfl: 0 .  colchicine 0.6 MG tablet, Take 1 tablet (0.6 mg total) by mouth daily. Take two at onset of gout and repeat in 1 hour, prn, Disp: 30 tablet, Rfl: 0 .  carvedilol (COREG) 6.25 MG tablet, Take 1 tablet (6.25 mg  total) by mouth 2 (two) times daily., Disp: 180 tablet, Rfl: 1 .  ezetimibe (ZETIA) 10 MG tablet, Take 1 tablet (10 mg total) by mouth daily., Disp: 90 tablet, Rfl: 1 .  losartan (COZAAR) 50 MG tablet, Take 1 tablet (50 mg total) by mouth daily., Disp: 90 tablet, Rfl: 1 .  metFORMIN (GLUCOPHAGE-XR) 750 MG 24 hr tablet, Take 1 tablet (750 mg total) by mouth daily with breakfast., Disp: 90 tablet, Rfl: 1 .  omeprazole (PRILOSEC) 40 MG capsule, Take 1 capsule (40 mg total) by mouth daily., Disp: 90 capsule, Rfl: 1 .  rosuvastatin (CRESTOR) 40 MG tablet, Take 1 tablet (40 mg total) by mouth daily., Disp: 90 tablet, Rfl: 1  Allergies  Allergen Reactions  . Metformin And Related Diarrhea    And nausea. Tolerates XR formulation    I personally reviewed active problem list, medication list, allergies, family history, social history, health maintenance with the patient/caregiver today.   ROS  Constitutional: Negative for fever or weight change.  Respiratory: Negative for cough and shortness of breath.   Cardiovascular: Negative for chest pain or palpitations.  Gastrointestinal: Negative for abdominal pain, no bowel changes.  Musculoskeletal: Negative for gait problem or joint swelling.  Skin: Negative for rash.  Neurological: Negative for dizziness or headache.  No other specific complaints in a complete review of systems (except as listed in HPI above).  Objective  Vitals:   07/24/20 1017  BP: 118/62  Pulse: 79  Resp: 16  Temp: 98.2 F (36.8 C)  TempSrc: Oral  SpO2: 98%  Weight: 164 lb (74.4 kg)  Height: _0  (1.575 m)    Body mass index is 30 kg/m.  Physical Exam  Constitutional: Patient appears well-developed and well-nourished. Obese  No distress.  HEENT: head atraumatic, normocephalic, pupils equal and reactive to light,neck supple Cardiovascular: Normal rate, regular rhythm and normal heart sounds.  No murmur heard. No BLE edema. Pulmonary/Chest: Effort normal and  breath sounds normal. No respiratory distress. Abdominal: Soft.  There is no tenderness. Psychiatric: Patient has a normal mood and affect. behavior is normal. Judgment and thought content normal  Recent Results (from the past 2160 hour(s))  POCT HgB A1C     Status: Abnormal   Collection Time: 07/24/20 10:27 AM  Result Value Ref Range   Hemoglobin A1C 6.9 (A) 4.0 - 5.6 %   HbA1c POC (<> result, manual entry)     HbA1c, POC (prediabetic range)     HbA1c, POC (controlled diabetic range)       PHQ2/9: Depression screen [facility] 2/9 07/24/2020 03/20/2020 10/19/2019 10/19/2019 06/18/2019  Decreased Interest 0 0 0 0 0  Down, Depressed, Hopeless 0 0 0 0 0  PHQ - 2 Score 0 0 0 0 0  Altered sleeping - - 0 - 0  Tired, decreased energy - - 0 - 0  Change in appetite - - 0 - 0  Feeling bad or failure about yourself  - - 0 - 0  Trouble concentrating - - 0 - 0  Moving slowly or fidgety/restless - - 0 - 0  Suicidal thoughts - - 0 - 0  PHQ-9 Score - - 0 - 0  Difficult doing work/chores - - - - -  Some recent data might be hidden    phq 9 is negative   Fall Risk: Fall Risk  07/24/2020 03/20/2020 10/19/2019 10/19/2019 06/18/2019  Falls in the past year? 0 0 0 0 0  Number falls in past yr: 0 0 0 0 0  Injury with Fall? 0 0 0 0 0  Risk for fall due to : - - - No Fall Risks -  Risk for fall due to: Comment - - - - -  Follow up - - - Falls prevention discussed -    Functional Status Survey: Is the patient deaf or have difficulty hearing?: No Does the patient have difficulty seeing, even when wearing glasses/contacts?: No Does the patient have difficulty concentrating, remembering, or making decisions?: No Does the patient have difficulty walking or climbing stairs?: No Does the patient have difficulty dressing or bathing?: No Does the patient have difficulty doing errands alone such as visiting a doctor's office or shopping?: No    Assessment & Plan  1. Type 2 diabetes mellitus with  microalbuminuria, without long-term current use of insulin (HCC)  - POCT HgB A1C - Lipid panel - Microalbumin / creatinine urine ratio - COMPLETE METABOLIC PANEL WITH GFR - metFORMIN (GLUCOPHAGE-XR) 750 MG 24 hr tablet; Take 1 tablet (750 mg total) by mouth daily with breakfast.  Dispense: 90 tablet; Refill: 1 - losartan (COZAAR) 50 MG tablet; Take 1 tablet (50 mg total) by mouth daily.  Dispense: 90 tablet; Refill: 1  2. Gastroesophageal reflux disease without esophagitis  - omeprazole (PRILOSEC) 40 MG capsule; Take 1 capsule (40 mg total) by mouth daily.  Dispense: 90 capsule; Refill: 1  3. Insomnia, persistent   4. Dyslipidemia  - ezetimibe (ZETIA) 10 MG tablet; Take 1 tablet (10 mg total) by mouth daily.  Dispense: 90 tablet; Refill: 1 - rosuvastatin (CRESTOR) 40 MG tablet; Take 1 tablet (40 mg total) by mouth daily.  Dispense: 90 tablet; Refill: 1  5. Mild intermittent asthma without complication   6. Arteriosclerosis of coronary artery  - ezetimibe (ZETIA) 10 MG tablet; Take 1 tablet (10 mg total) by mouth daily.  Dispense: 90 tablet; Refill: 1  7. Essential hypertension  - CBC with Differential/Platelet - carvedilol (COREG) 6.25 MG tablet; Take 1 tablet (6.25 mg total) by mouth 2 (two) times daily.  Dispense: 180 tablet; Refill: 1 - losartan (COZAAR) 50 MG tablet; Take 1 tablet (50 mg total) by mouth daily.  Dispense: 90 tablet; Refill: 1  8. History of acute myocardial infarction  - carvedilol (COREG) 6.25 MG tablet; Take 1 tablet (6.25 mg total) by mouth 2 (two) times daily.  Dispense: 180 tablet; Refill: 1 - losartan (COZAAR) 50 MG tablet; Take 1 tablet (50 mg total) by mouth daily.  Dispense: 90 tablet; Refill: 1 - rosuvastatin (CRESTOR) 40 MG tablet; Take 1 tablet (40 mg total) by mouth daily.  Dispense: 90 tablet; Refill: 1  9. Diabetes mellitus with coincident hypertension ([place])   10. Dyslipidemia associated with type 2 diabetes mellitus ([place])

## 2020-07-24 ENCOUNTER — Other Ambulatory Visit: Payer: Self-pay

## 2020-07-24 ENCOUNTER — Ambulatory Visit (INDEPENDENT_AMBULATORY_CARE_PROVIDER_SITE_OTHER): Payer: Medicare Other | Admitting: Family Medicine

## 2020-07-24 ENCOUNTER — Encounter: Payer: Self-pay | Admitting: Family Medicine

## 2020-07-24 VITALS — BP 118/62 | HR 79 | Temp 98.2°F | Resp 16 | Ht 62.0 in | Wt 164.0 lb

## 2020-07-24 DIAGNOSIS — J4520 Mild intermittent asthma, uncomplicated: Secondary | ICD-10-CM

## 2020-07-24 DIAGNOSIS — E1129 Type 2 diabetes mellitus with other diabetic kidney complication: Secondary | ICD-10-CM | POA: Diagnosis not present

## 2020-07-24 DIAGNOSIS — I2510 Atherosclerotic heart disease of native coronary artery without angina pectoris: Secondary | ICD-10-CM

## 2020-07-24 DIAGNOSIS — I10 Essential (primary) hypertension: Secondary | ICD-10-CM

## 2020-07-24 DIAGNOSIS — E119 Type 2 diabetes mellitus without complications: Secondary | ICD-10-CM | POA: Diagnosis not present

## 2020-07-24 DIAGNOSIS — R809 Proteinuria, unspecified: Secondary | ICD-10-CM

## 2020-07-24 DIAGNOSIS — I252 Old myocardial infarction: Secondary | ICD-10-CM

## 2020-07-24 DIAGNOSIS — E785 Hyperlipidemia, unspecified: Secondary | ICD-10-CM

## 2020-07-24 DIAGNOSIS — G4700 Insomnia, unspecified: Secondary | ICD-10-CM | POA: Diagnosis not present

## 2020-07-24 DIAGNOSIS — E1169 Type 2 diabetes mellitus with other specified complication: Secondary | ICD-10-CM | POA: Diagnosis not present

## 2020-07-24 DIAGNOSIS — K219 Gastro-esophageal reflux disease without esophagitis: Secondary | ICD-10-CM | POA: Diagnosis not present

## 2020-07-24 LAB — POCT GLYCOSYLATED HEMOGLOBIN (HGB A1C): Hemoglobin A1C: 6.9 % — AB (ref 4.0–5.6)

## 2020-07-24 MED ORDER — METFORMIN HCL ER 750 MG PO TB24
750.0000 mg | ORAL_TABLET | Freq: Every day | ORAL | 1 refills | Status: DC
Start: 2020-07-24 — End: 2021-02-15

## 2020-07-24 MED ORDER — OMEPRAZOLE 40 MG PO CPDR: 40.0000 mg | DELAYED_RELEASE_CAPSULE | Freq: Every day | ORAL | 1 refills | Status: DC

## 2020-07-24 MED ORDER — ROSUVASTATIN CALCIUM 40 MG PO TABS: 40.0000 mg | ORAL_TABLET | Freq: Every day | ORAL | 1 refills | Status: DC

## 2020-07-24 MED ORDER — LOSARTAN POTASSIUM 50 MG PO TABS: 50.0000 mg | ORAL_TABLET | Freq: Every day | ORAL | 1 refills | Status: DC

## 2020-07-24 MED ORDER — CARVEDILOL 6.25 MG PO TABS: 6.2500 mg | ORAL_TABLET | Freq: Two times a day (BID) | ORAL | 1 refills | Status: DC

## 2020-07-24 MED ORDER — EZETIMIBE 10 MG PO TABS: 10.0000 mg | ORAL_TABLET | Freq: Every day | ORAL | 1 refills | Status: DC

## 2020-07-24 NOTE — Patient Instructions (Signed)
May you be healthy, may you be happy, may you be safe

## 2020-07-25 LAB — LIPID PANEL
Cholesterol: 123 mg/dL (ref <200)
HDL: 39 mg/dL — ABNORMAL LOW (ref >=50)
LDL Cholesterol (Calc): 64 mg/dL (calc)
Non-HDL Cholesterol (Calc): 84 mg/dL (calc) (ref <130)
Total CHOL/HDL Ratio: 3.2 (calc) (ref <5.0)
Triglycerides: 113 mg/dL (ref <150)

## 2020-07-25 LAB — CBC WITH DIFFERENTIAL/PLATELET
Absolute Monocytes: 518 {cells}/uL (ref 200–950)
Basophils Absolute: 67 {cells}/uL (ref 0–200)
Basophils Relative: 0.9 %
Eosinophils Absolute: 104 {cells}/uL (ref 15–500)
Eosinophils Relative: 1.4 %
HCT: 40 % (ref 35.0–45.0)
Hemoglobin: 13.2 g/dL (ref 11.7–15.5)
Lymphs Abs: 1872 {cells}/uL (ref 850–3900)
MCH: 28.4 pg (ref 27.0–33.0)
MCHC: 33 g/dL (ref 32.0–36.0)
MCV: 86.2 fL (ref 80.0–100.0)
MPV: 10.4 fL (ref 7.5–12.5)
Monocytes Relative: 7 %
Neutro Abs: 4840 {cells}/uL (ref 1500–7800)
Neutrophils Relative %: 65.4 %
Platelets: 272 10*3/uL (ref 140–400)
RBC: 4.64 Million/uL (ref 3.80–5.10)
RDW: 12.1 % (ref 11.0–15.0)
Total Lymphocyte: 25.3 %
WBC: 7.4 10*3/uL (ref 3.8–10.8)

## 2020-07-25 LAB — COMPLETE METABOLIC PANEL WITH GFR
AG Ratio: 1.9 (calc) (ref 1.0–2.5)
ALT: 18 U/L (ref 6–29)
AST: 17 U/L (ref 10–35)
Albumin: 4.6 g/dL (ref 3.6–5.1)
Alkaline phosphatase (APISO): 63 U/L (ref 37–153)
BUN: 10 mg/dL (ref 7–25)
CO2: 30 mmol/L (ref 20–32)
Calcium: 9.8 mg/dL (ref 8.6–10.4)
Chloride: 105 mmol/L (ref 98–110)
Creat: 0.61 mg/dL (ref 0.60–0.93)
GFR, Est African American: 106 mL/min/{1.73_m2} (ref >=60)
GFR, Est Non African American: 92 mL/min/{1.73_m2} (ref >=60)
Globulin: 2.4 g/dL (calc) (ref 1.9–3.7)
Glucose, Bld: 118 mg/dL — ABNORMAL HIGH (ref 65–99)
Potassium: 4.2 mmol/L (ref 3.5–5.3)
Sodium: 143 mmol/L (ref 135–146)
Total Bilirubin: 0.5 mg/dL (ref 0.2–1.2)
Total Protein: 7 g/dL (ref 6.1–8.1)

## 2020-07-25 LAB — MICROALBUMIN / CREATININE URINE RATIO
Creatinine, Urine: 132 mg/dL (ref 20–275)
Microalb Creat Ratio: 16 mcg/mg creat (ref <30)
Microalb, Ur: 2.1 mg/dL

## 2020-10-16 DIAGNOSIS — E119 Type 2 diabetes mellitus without complications: Secondary | ICD-10-CM | POA: Diagnosis not present

## 2020-10-18 LAB — HM DIABETES EYE EXAM

## 2020-10-19 ENCOUNTER — Ambulatory Visit: Payer: Medicare Other

## 2020-10-31 ENCOUNTER — Telehealth: Payer: Self-pay | Admitting: Family Medicine

## 2020-10-31 ENCOUNTER — Ambulatory Visit: Payer: Medicare Other

## 2020-10-31 NOTE — Telephone Encounter (Signed)
Patient called to cancel her appt. Today.  She stated she has a family emergency and will have to call back to reschedule.

## 2020-11-07 ENCOUNTER — Ambulatory Visit (INDEPENDENT_AMBULATORY_CARE_PROVIDER_SITE_OTHER): Payer: Medicare Other

## 2020-11-07 DIAGNOSIS — Z0000 Encounter for general adult medical examination without abnormal findings: Secondary | ICD-10-CM

## 2020-11-07 NOTE — Progress Notes (Signed)
Subjective:   Dawn Werner is a 70 y.o. female who presents for Medicare Annual (Subsequent) preventive examination.  Virtual Visit via Telephone Note  I connected with  [place] on 11/07/20 at  3:30 PM EDT by telephone and verified that I am speaking with the correct person using two identifiers.  Location: Patient: home Provider: [place] Persons participating in the virtual visit: [place]   I discussed the limitations, risks, security and privacy concerns of performing an evaluation and management service by telephone and the availability of in person appointments. The patient expressed understanding and agreed to proceed.  Interactive audio and video telecommunications were attempted between this nurse and patient, however failed, due to patient having technical difficulties OR patient did not have access to video capability.  We continued and completed visit with audio only.  Some vital signs may be absent or patient reported.   Clemetine Marker, LPN   Review of Systems     Cardiac Risk Factors include: advanced age (>16mn, >>28women);diabetes mellitus;dyslipidemia;hypertension     Objective:    There were no vitals filed for this visit. There is no height or weight on file to calculate BMI.  Advanced Directives 11/07/2020 10/19/2019 04/24/2019 10/15/2018 10/10/2017 09/15/2017 04/04/2017  Does Patient Have a Medical Advance Directive? No No No No No No No  Would patient like information on creating a medical advance directive? No - Patient declined No - Patient declined - Yes (MAU/Ambulatory/Procedural Areas - Information given) Yes (MAU/Ambulatory/Procedural Areas - Information given) - -    Current Medications (verified) Outpatient Encounter Medications as of 11/07/2020  Medication Sig  . ACCU-CHEK GUIDE test strip USE TO TEST BLOOD SUGAR UP TO 4 TIMES A DAY AS DIRECTED  . Accu-Chek Softclix Lancets lancets USE TO TEST BLOOD SUGAR UP TO 4 TIMES A  DAY AS DIRECTED  . albuterol (VENTOLIN HFA) 108 (90 Base) MCG/ACT inhaler Inhale 2 puffs into the lungs as needed.  . Blood Glucose Monitoring Suppl (ACCU-CHEK GUIDE ME) w/Device KIT USE AS DIRECTED  . carvedilol (COREG) 6.25 MG tablet Take 1 tablet (6.25 mg total) by mouth 2 (two) times daily.  . colchicine 0.6 MG tablet Take 1 tablet (0.6 mg total) by mouth daily. Take two at onset of gout and repeat in 1 hour, prn  . ezetimibe (ZETIA) 10 MG tablet Take 1 tablet (10 mg total) by mouth daily.  .Marland Kitchenlosartan (COZAAR) 50 MG tablet Take 1 tablet (50 mg total) by mouth daily.  . metFORMIN (GLUCOPHAGE-XR) 750 MG 24 hr tablet Take 1 tablet (750 mg total) by mouth daily with breakfast.  . omeprazole (PRILOSEC) 40 MG capsule Take 1 capsule (40 mg total) by mouth daily.  . rosuvastatin (CRESTOR) 40 MG tablet Take 1 tablet (40 mg total) by mouth daily.  . [DISCONTINUED] aspirin EC 81 MG tablet Take 1 tablet by mouth daily.   No facility-administered encounter medications on file as of 11/07/2020.    Allergies (verified) Metformin and related   History: Past Medical History:  Diagnosis Date  . Anxiety   . Asthma   . Diabetes mellitus without complication ([place]   . GERD (gastroesophageal reflux disease)   . Hyperlipidemia   . Insomnia    Past Surgical History:  Procedure Laterality Date  . ABDOMINAL HYSTERECTOMY    . BREAST SURGERY     breast reduction  . REDUCTION MAMMAPLASTY Bilateral 1995  . TUBAL LIGATION     Family History  Problem Relation Age of  Onset  . Hyperlipidemia Mother   . Hypertension Mother   . Stroke Mother   . Dementia Father   . Cancer Sister        breast  . Breast cancer Sister 34  . Asthma Brother   . Depression Brother        bipolar  . Heart disease Brother   . Heart disease Brother    Social History   Socioeconomic History  . Marital status: Married    Spouse name: [name]  . Number of children: 2  . Years of education: Not on file  . Highest education  level: 12th grade  Occupational History  . 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 Smoking status: Never Smoker  . Smokeless tobacco: Never Used  Vaping Use  . Vaping Use: Never used  Substance and Sexual Activity  . Alcohol use: No    Alcohol/week: 0.0 standard drinks  . Drug use: No  . Sexual activity: Yes    Partners: Male  Other Topics Concern  . Not on file  Social History Narrative  . Not on file   Social Determinants of Health   Financial Resource Strain: Low Risk   . Difficulty of Paying Living Expenses: Not hard at all  Food Insecurity: No Food Insecurity  . Worried About Charity fundraiser in the Last Year: Never true  . Ran Out of Food in the Last Year: Never true  Transportation Needs: No Transportation Needs  . Lack of Transportation (Medical): No  . Lack of Transportation (Non-Medical): No  Physical Activity: Sufficiently Active  . Days of Exercise per Week: 7 days  . Minutes of Exercise per Session: 30 min  Stress: No Stress Concern Present  . Feeling of Stress : Only a little  Social Connections: Socially Integrated  . Frequency of Communication with Friends and Family: More than three times a week  . Frequency of Social Gatherings with Friends and Family: More than three times a week  . Attends Religious Services: More than 4 times per year  . Active Member of Clubs or Organizations: Yes  . Attends Archivist Meetings: More than 4 times per year  . Marital Status: Married    Tobacco Counseling Counseling given: Not Answered   Clinical Intake:  Pre-visit preparation completed: Yes  Pain : No/denies pain     Nutritional Risks: None Diabetes: Yes CBG done?: No Did pt. bring in CBG monitor from home?: No  How often do you need to have someone help you when you read instructions, pamphlets, or other written materials from your doctor or pharmacy?: 1 - Never  Nutrition Risk Assessment:  Has the patient had any  N/V/D within the last 2 months?  No  Does the patient have any non-healing wounds?  No  Has the patient had any unintentional weight loss or weight gain?  No   Diabetes:  Is the patient diabetic?  Yes  If diabetic, was a CBG obtained today?  No  Did the patient bring in their glucometer from home?  No  How often do you monitor your CBG's? occasionally per patient.   Financial Strains and Diabetes Management:  Are you having any financial strains with the device, your supplies or your medication? No .  Does the patient want to be seen by Chronic Care Management for management of their diabetes?  No  Would the patient like to be referred to a Nutritionist or for Diabetic  Management?  No   Diabetic Exams:  Diabetic Eye Exam: Completed 10/18/20 negative retinopathy.   Diabetic Foot Exam: Completed 03/20/20.   Interpreter Needed?: No  Information entered by :: Clemetine Marker LPN   Activities of Daily Living In your present state of health, do you have any difficulty performing the following activities: 11/07/2020 07/24/2020  Hearing? N N  Comment declines hearing aids -  Vision? N N  Difficulty concentrating or making decisions? N N  Walking or climbing stairs? N N  Dressing or bathing? N N  Doing errands, shopping? N N  Preparing Food and eating ? N -  Using the Toilet? N -  In the past six months, have you accidently leaked urine? N -  Do you have problems with loss of bowel control? N -  Managing your Medications? N -  Managing your Finances? N -  Housekeeping or managing your Housekeeping? N -  Some recent data might be hidden    Patient Care Team: Steele Sizer, MD as PCP - General (Family Medicine)  Indicate any recent Medical Services you may have received from other than [facility] providers in the past year (date may be approximate).     Assessment:   This is a routine wellness examination for Dawn.  Hearing/Vision screen  Hearing Screening   125Hz  250Hz  500Hz   1000Hz  2000Hz  3000Hz  4000Hz  6000Hz  8000Hz   Right ear:           Left ear:           Comments: Pt denies hearing difficulty  Vision Screening Comments: Annual vision screenings at [facility]  Dietary issues and exercise activities discussed: Current Exercise Habits: Home exercise routine, Type of exercise: Other - see comments (yard work), Time (Minutes): 30, Frequency (Times/Week): 7, Weekly Exercise (Minutes/Week): 210, Intensity: Mild, Exercise limited by: None identified  Goals Addressed            This Visit's Progress   . DIET - INCREASE WATER INTAKE   On track    Recommend to drink at least 6-8 8oz glasses of water per day.      Depression Screen PHQ 2/9 Scores 11/07/2020 07/24/2020 03/20/2020 10/19/2019 10/19/2019 06/18/2019 04/30/2019  PHQ - 2 Score 0 0 0 0 0 0 0  PHQ- 9 Score - - - 0 - 0 0    Fall Risk Fall Risk  11/07/2020 07/24/2020 03/20/2020 10/19/2019 10/19/2019  Falls in the past year? 0 0 0 0 0  Number falls in past yr: 0 0 0 0 0  Injury with Fall? 0 0 0 0 0  Risk for fall due to : No Fall Risks - - - No Fall Risks  Risk for fall due to: Comment - - - - -  Follow up Falls prevention discussed - - - Falls prevention discussed    FALL RISK PREVENTION PERTAINING TO THE HOME:  Any stairs in or around the home? Yes  If so, are there any without handrails? No  Home free of loose throw rugs in walkways, pet beds, electrical cords, etc? Yes  Adequate lighting in your home to reduce risk of falls? Yes   ASSISTIVE DEVICES UTILIZED TO PREVENT FALLS:  Life alert? No  Use of a cane, walker or w/c? No  Grab bars in the bathroom? Yes  Shower chair or bench in shower? Yes  Elevated toilet seat or a handicapped toilet? Yes   TIMED UP AND GO:  Was the test performed? No . Telephonic visit.  Cognitive Function: Normal cognitive status assessed by direct observation by this Nurse Health Advisor. No abnormalities found.       6CIT Screen 10/19/2019 10/15/2018  10/10/2017 06/24/2016  What Year? 0 points 0 points 0 points 0 points  What month? 0 points 0 points 0 points 0 points  What time? 0 points 0 points 0 points 0 points  Count back from 20 0 points 0 points 0 points 0 points  Months in reverse 0 points 0 points 0 points 0 points  Repeat phrase 4 points 4 points 0 points 2 points  Total Score 4 4 0 2    Immunizations Immunization History  Administered Date(s) Administered  . Fluad Quad(high Dose 65+) 02/16/2019, 03/20/2020  . Influenza, High Dose Seasonal PF 02/20/2015, 05/24/2016, 01/29/2017, 02/10/2018  . Moderna Sars-Covid-2 Vaccination 07/05/2019, 08/02/2019, 04/01/2020, 10/10/2020  . Pneumococcal Conjugate-13 01/10/2016  . Pneumococcal Polysaccharide-23 11/18/2014  . Td 01/29/2017  . Tdap 04/10/2007  . Tetanus 04/10/2007    TDAP status: Up to date  Flu Vaccine status: Up to date  Pneumococcal vaccine status: Up to date  Covid-19 vaccine status: Completed vaccines  Qualifies for Shingles Vaccine? Yes   Zostavax completed Yes   Shingrix Completed?: No.    Education has been provided regarding the importance of this vaccine. Patient has been advised to call insurance company to determine out of pocket expense if they have not yet received this vaccine. Advised may also receive vaccine at local pharmacy or Health Dept. Verbalized acceptance and understanding.  Screening Tests Health Maintenance  Topic Date Due  . Zoster Vaccines- Shingrix (1 of 2) Never done  . INFLUENZA VACCINE  01/08/2021  . HEMOGLOBIN A1C  01/21/2021  . FOOT EXAM  03/20/2021  . MAMMOGRAM  03/28/2021  . OPHTHALMOLOGY EXAM  10/18/2021  . COLONOSCOPY (Pts 45-25yr Insurance coverage will need to be confirmed)  06/19/2023  . TETANUS/TDAP  01/30/2027  . DEXA SCAN  Completed  . COVID-19 Vaccine  Completed  . Hepatitis C Screening  Completed  . PNA vac Low Risk Adult  Completed  . HPV VACCINES  Aged Out    Health Maintenance  Health Maintenance Due   Topic Date Due  . Zoster Vaccines- Shingrix (1 of 2) Never done    Colorectal cancer screening: Type of screening: Colonoscopy. Completed 06/18/13. Repeat every 10 years  Mammogram status: Completed 03/28/20. Repeat every year  Bone Density status: Completed 2008. Results reflect: Bone density results: OSTEOPENIA. Repeat every 2 years.  Lung Cancer Screening: (Low Dose CT Chest recommended if Age 569-80years, 30 pack-year currently smoking OR have quit w/in 15years.) does not qualify.   Additional Screening:  Hepatitis C Screening: does qualify; Completed 06/26/12  Vision Screening: Recommended annual ophthalmology exams for early detection of glaucoma and other disorders of the eye. Is the patient up to date with their annual eye exam?  Yes  Who is the provider or what is the name of the office in which the patient attends annual eye exams? [facility]: Recommended annual dental exams for proper oral hygiene  Community Resource Referral / Chronic Care Management: CRR required this visit?  No   CCM required this visit?  No      Plan:     I have personally reviewed and noted the following in the patient's chart:   . Medical and social history . Use of alcohol, tobacco or illicit drugs  . Current medications and supplements including opioid prescriptions.  . Functional  ability and status . Nutritional status . Physical activity . Advanced directives . List of other physicians . Hospitalizations, surgeries, and ER visits in previous 12 months . Vitals . Screenings to include cognitive, depression, and falls . Referrals and appointments  In addition, I have reviewed and discussed with patient certain preventive protocols, quality metrics, and best practice recommendations. A written personalized care plan for preventive services as well as general preventive health recommendations were provided to patient.     Clemetine Marker, LPN   10/27/8020    Nurse Notes: pt scheduled for follow up tomorrow with Dr. Ancil Boozer

## 2020-11-07 NOTE — Patient Instructions (Signed)
[name] , Thank you for taking time to come for your Medicare Wellness Visit. I appreciate your ongoing commitment to your health goals. Please review the following plan we discussed and let me know if I can assist you in the future.   Screening recommendations/referrals: Colonoscopy: done 06/18/13. Repeat in 2025 Mammogram: done 03/28/20. Please call [phone] to schedule your mammogram and bone density screening.  Bone Density: done 2008 Recommended yearly ophthalmology/optometry visit for glaucoma screening and checkup Recommended yearly dental visit for hygiene and checkup  Vaccinations: Influenza vaccine: done 03/20/20 Pneumococcal vaccine: done 01/10/16 Tdap vaccine: done 01/29/17 Shingles vaccine: Shingrix discussed. Please contact your pharmacy for coverage information.  Covid-19: done 07/05/19, 08/02/19, 04/01/20 & 10/10/20  Advanced directives: Please bring a copy of your health care power of attorney and living will to the office at your convenience once you have completed those documents.   Conditions/risks identified: Keep up the great work!  Next appointment: Follow up in one year for your annual wellness visit    Preventive Care 65 Years and Older, Female Preventive care refers to lifestyle choices and visits with your health care provider that can promote health and wellness. What does preventive care include?  A yearly physical exam. This is also called an annual well check.  Dental exams once or twice a year.  Routine eye exams. Ask your health care provider how often you should have your eyes checked.  Personal lifestyle choices, including:  Daily care of your teeth and gums.  Regular physical activity.  Eating a healthy diet.  Avoiding tobacco and drug use.  Limiting alcohol use.  Practicing safe sex.  Taking low-dose aspirin every day.  Taking vitamin and mineral supplements as recommended by your health care provider. What happens during an  annual well check? The services and screenings done by your health care provider during your annual well check will depend on your age, overall health, lifestyle risk factors, and family history of disease. Counseling  Your health care provider may ask you questions about your:  Alcohol use.  Tobacco use.  Drug use.  Emotional well-being.  Home and relationship well-being.  Sexual activity.  Eating habits.  History of falls.  Memory and ability to understand (cognition).  Work and work Astronomer.  Reproductive health. Screening  You may have the following tests or measurements:  Height, weight, and BMI.  Blood pressure.  Lipid and cholesterol levels. These may be checked every 5 years, or more frequently if you are over 38 years old.  Skin check.  Lung cancer screening. You may have this screening every year starting at age 41 if you have a 30-pack-year history of smoking and currently smoke or have quit within the past 15 years.  Fecal occult blood test (FOBT) of the stool. You may have this test every year starting at age 12.  Flexible sigmoidoscopy or colonoscopy. You may have a sigmoidoscopy every 5 years or a colonoscopy every 10 years starting at age 27.  Hepatitis C blood test.  Hepatitis B blood test.  Sexually transmitted disease (STD) testing.  Diabetes screening. This is done by checking your blood sugar (glucose) after you have not eaten for a while (fasting). You may have this done every 1-3 years.  Bone density scan. This is done to screen for osteoporosis. You may have this done starting at age 54.  Mammogram. This may be done every 1-2 years. Talk to your health care provider about how often you should have regular mammograms.  Talk with your health care provider about your test results, treatment options, and if necessary, the need for more tests. Vaccines  Your health care provider may recommend certain vaccines, such as:  Influenza  vaccine. This is recommended every year.  Tetanus, diphtheria, and acellular pertussis (Tdap, Td) vaccine. You may need a Td booster every 10 years.  Zoster vaccine. You may need this after age 58.  Pneumococcal 13-valent conjugate (PCV13) vaccine. One dose is recommended after age 2.  Pneumococcal polysaccharide (PPSV23) vaccine. One dose is recommended after age 97. Talk to your health care provider about which screenings and vaccines you need and how often you need them. This information is not intended to replace advice given to you by your health care provider. Make sure you discuss any questions you have with your health care provider. Document Released: 06/23/2015 Document Revised: 02/14/2016 Document Reviewed: 03/28/2015 Elsevier Interactive Patient Education  2017 [employer] Prevention in the Home Falls can cause injuries. They can happen to people of all ages. There are many things you can do to make your home safe and to help prevent falls. What can I do on the outside of my home?  Regularly fix the edges of walkways and driveways and fix any cracks.  Remove anything that might make you trip as you walk through a door, such as a raised step or threshold.  Trim any bushes or trees on the path to your home.  Use bright outdoor lighting.  Clear any walking paths of anything that might make someone trip, such as rocks or tools.  Regularly check to see if handrails are loose or broken. Make sure that both sides of any steps have handrails.  Any raised decks and porches should have guardrails on the edges.  Have any leaves, snow, or ice cleared regularly.  Use sand or salt on walking paths during winter.  Clean up any spills in your garage right away. This includes oil or grease spills. What can I do in the bathroom?  Use night lights.  Install grab bars by the toilet and in the tub and shower. Do not use towel bars as grab bars.  Use non-skid mats or decals  in the tub or shower.  If you need to sit down in the shower, use a plastic, non-slip stool.  Keep the floor dry. Clean up any water that spills on the floor as soon as it happens.  Remove soap buildup in the tub or shower regularly.  Attach bath mats securely with double-sided non-slip rug tape.  Do not have throw rugs and other things on the floor that can make you trip. What can I do in the bedroom?  Use night lights.  Make sure that you have a light by your bed that is easy to reach.  Do not use any sheets or blankets that are too big for your bed. They should not hang down onto the floor.  Have a firm chair that has side arms. You can use this for support while you get dressed.  Do not have throw rugs and other things on the floor that can make you trip. What can I do in the kitchen?  Clean up any spills right away.  Avoid walking on wet floors.  Keep items that you use a lot in easy-to-reach places.  If you need to reach something above you, use a strong step stool that has a grab bar.  Keep electrical cords out of the way.  Do not use floor polish or wax that makes floors slippery. If you must use wax, use non-skid floor wax.  Do not have throw rugs and other things on the floor that can make you trip. What can I do with my stairs?  Do not leave any items on the stairs.  Make sure that there are handrails on both sides of the stairs and use them. Fix handrails that are broken or loose. Make sure that handrails are as long as the stairways.  Check any carpeting to make sure that it is firmly attached to the stairs. Fix any carpet that is loose or worn.  Avoid having throw rugs at the top or bottom of the stairs. If you do have throw rugs, attach them to the floor with carpet tape.  Make sure that you have a light switch at the top of the stairs and the bottom of the stairs. If you do not have them, ask someone to add them for you. What else can I do to help  prevent falls?  Wear shoes that:  Do not have high heels.  Have rubber bottoms.  Are comfortable and fit you well.  Are closed at the toe. Do not wear sandals.  If you use a stepladder:  Make sure that it is fully opened. Do not climb a closed stepladder.  Make sure that both sides of the stepladder are locked into place.  Ask someone to hold it for you, if possible.  Clearly mark and make sure that you can see:  Any grab bars or handrails.  First and last steps.  Where the edge of each step is.  Use tools that help you move around (mobility aids) if they are needed. These include:  Canes.  Walkers.  Scooters.  Crutches.  Turn on the lights when you go into a dark area. Replace any light bulbs as soon as they burn out.  Set up your furniture so you have a clear path. Avoid moving your furniture around.  If any of your floors are uneven, fix them.  If there are any pets around you, be aware of where they are.  Review your medicines with your doctor. Some medicines can make you feel dizzy. This can increase your chance of falling. Ask your doctor what other things that you can do to help prevent falls. This information is not intended to replace advice given to you by your health care provider. Make sure you discuss any questions you have with your health care provider. Document Released: 03/23/2009 Document Revised: 11/02/2015 Document Reviewed: 07/01/2014 Elsevier Interactive Patient Education  2017 [employer].

## 2020-11-07 NOTE — Progress Notes (Signed)
Name: Dawn Werner   MRN: [id number]    DOB: December 01, 1950   Date:11/08/2020       Progress Note  Subjective  Chief Complaint  Follow Up  HPI  DMII: HerhgbA1C went down from 7.8% to 7.3%, up to 7.4%, 7.3%,6.9%, 6.8%,7.5%6.8%downto 6.5% , 6.4 % 7.1%,down to 6.9 % and today is 7 %. She states she has been eating biscuits for breakfast.  Shehas history of nephropathy and is on ARB, no neuropathyShe denies polyphagia, polydipsia or polyuria. She is taking Metformin ER to 750 mg ER without side effects. Compliant with statin therapy. Last urine micro was negative   OA both knees and left hip: she states hip does not bother her, right knee is the worse, seen at [facility]. Pain is not daily and not bothering her enough to go back to Ortho. Rarely takes a Tylenol   Hyperlipidemia: she is back on Crestor 40 mgand Zetia 10 mg dailyDenies myalgias Last LDL at goal   HTN: No chest pain, palpitation or SOB , taking medication as prescribed. No side effects   CAD: no episodes, s/p history of MI in 2009, taking aspirin daily, beta-blocker and statin therapy. No chest pain or  decrease in exercise tolerance.Continue current regiment   Asthma Mild Intermittent:she has been doing well, no cough, wheezing or SOB lately, she has to use Breo daily during the month of March due to high pollen count , she has not used rescue inhaler lately   Atherosclerosis of aorta: on statin and aspirin,last LDL at goal   GERD: doing well on Omeprazole . No heartburn or indigestion    Patient Active Problem List   Diagnosis Date Noted  . Diabetes mellitus with coincident hypertension ([place]) 07/24/2020  . Belching 08/18/2018  . 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 Derangement of posterior horn of lateral meniscus of right knee 01/10/2016  . 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 Primary osteoarthritis of knee 01/24/2015  . Asthma, mild intermittent 11/18/2014  . Arteriosclerosis of coronary artery  11/18/2014  . Insomnia, persistent 11/18/2014  . Dyslipidemia 11/18/2014  . Acid reflux 11/18/2014  . Diabetes type 2, controlled ([place]) 11/18/2014  . Genital herpes in women 11/18/2014  . Hypertension 11/18/2014  . Asymptomatic postmenopausal status 11/18/2014  . History of acute myocardial infarction 11/18/2014    Past Surgical History:  Procedure Laterality Date  . ABDOMINAL HYSTERECTOMY    . BREAST SURGERY     breast reduction  . REDUCTION MAMMAPLASTY Bilateral 1995  . TUBAL LIGATION      Family History  Problem Relation Age of Onset  . Hyperlipidemia Mother   . Hypertension Mother   . Stroke Mother   . Dementia Father   . Cancer Sister        breast  . Breast cancer Sister 73  . Asthma Brother   . Depression Brother        bipolar  . Heart disease Brother   . Heart disease Brother     Social History   Tobacco Use  . Smoking status: Never Smoker  . Smokeless tobacco: Never Used  Substance Use Topics  . Alcohol use: No    Alcohol/week: 0.0 standard drinks     Current Outpatient Medications:  .  ACCU-CHEK GUIDE test strip, USE TO TEST BLOOD SUGAR UP TO 4 TIMES A DAY AS DIRECTED, Disp: 100 strip, Rfl: 11 .  Accu-Chek Softclix Lancets lancets, USE TO TEST BLOOD SUGAR UP TO 4 TIMES A DAY AS DIRECTED, Disp: 100  each, Rfl: 0 .  albuterol (VENTOLIN HFA) 108 (90 Base) MCG/ACT inhaler, Inhale 2 puffs into the lungs as needed., Disp: , Rfl:  .  Blood Glucose Monitoring Suppl (ACCU-CHEK GUIDE ME) w/Device KIT, USE AS DIRECTED, Disp: 1 kit, Rfl: 0 .  carvedilol (COREG) 6.25 MG tablet, Take 1 tablet (6.25 mg total) by mouth 2 (two) times daily., Disp: 180 tablet, Rfl: 1 .  colchicine 0.6 MG tablet, Take 1 tablet (0.6 mg total) by mouth daily. Take two at onset of gout and repeat in 1 hour, prn, Disp: 30 tablet, Rfl: 0 .  ezetimibe (ZETIA) 10 MG tablet, Take 1 tablet (10 mg total) by mouth daily., Disp: 90 tablet, Rfl: 1 .  losartan (COZAAR) 50 MG tablet, Take 1 tablet (50  mg total) by mouth daily., Disp: 90 tablet, Rfl: 1 .  metFORMIN (GLUCOPHAGE-XR) 750 MG 24 hr tablet, Take 1 tablet (750 mg total) by mouth daily with breakfast., Disp: 90 tablet, Rfl: 1 .  omeprazole (PRILOSEC) 40 MG capsule, Take 1 capsule (40 mg total) by mouth daily., Disp: 90 capsule, Rfl: 1 .  rosuvastatin (CRESTOR) 40 MG tablet, Take 1 tablet (40 mg total) by mouth daily., Disp: 90 tablet, Rfl: 1  Allergies  Allergen Reactions  . Metformin And Related Diarrhea    And nausea. Tolerates XR formulation    I personally reviewed active problem list, medication list, allergies, family history, social history, health maintenance with the patient/caregiver today.   ROS  Constitutional: Negative for fever or weight change.  Respiratory: Negative for cough and shortness of breath.   Cardiovascular: Negative for chest pain or palpitations.  Gastrointestinal: Negative for abdominal pain, no bowel changes.  Musculoskeletal: Negative for gait problem or joint swelling.  Skin: Negative for rash.  Neurological: Negative for dizziness or headache.  No other specific complaints in a complete review of systems (except as listed in HPI above).  Objective  Vitals:   11/08/20 0938  BP: 112/60  Pulse: 73  Resp: 16  Temp: 98.5 F (36.9 C)  TempSrc: Oral  SpO2: 97%  Weight: 163 lb (73.9 kg)  Height: 5' 2"  (1.575 m)    Body mass index is 29.81 kg/m.  Physical Exam  Constitutional: Patient appears well-developed and well-nourished. Overweight.No distress.  HEENT: head atraumatic, normocephalic, pupils equal and reactive to light, neck supple Cardiovascular: Normal rate, regular rhythm and normal heart sounds.  No murmur heard. No BLE edema. Pulmonary/Chest: Effort normal and breath sounds normal. No respiratory distress. Abdominal: Soft.  There is no tenderness. Psychiatric: Patient has a normal mood and affect. behavior is normal. Judgment and thought content normal.  Recent Results  (from the past 2160 hour(s))  HM DIABETES EYE EXAM     Status: None   Collection Time: 10/18/20 12:00 AM  Result Value Ref Range   HM Diabetic Eye Exam No Retinopathy No Retinopathy  POCT HgB A1C     Status: Abnormal   Collection Time: 11/08/20  9:43 AM  Result Value Ref Range   Hemoglobin A1C 7.0 (A) 4.0 - 5.6 %   HbA1c POC (<> result, manual entry)     HbA1c, POC (prediabetic range)     HbA1c, POC (controlled diabetic range)       PHQ2/9: Depression screen [facility] 2/9 11/08/2020 11/07/2020 07/24/2020 03/20/2020 10/19/2019  Decreased Interest 0 0 0 0 0  Down, Depressed, Hopeless 0 0 0 0 0  PHQ - 2 Score 0 0 0 0 0  Altered sleeping - - - -  0  Tired, decreased energy - - - - 0  Change in appetite - - - - 0  Feeling bad or failure about yourself  - - - - 0  Trouble concentrating - - - - 0  Moving slowly or fidgety/restless - - - - 0  Suicidal thoughts - - - - 0  PHQ-9 Score - - - - 0  Difficult doing work/chores - - - - -  Some recent data might be hidden    phq 9 is negative   Fall Risk: Fall Risk  11/08/2020 11/07/2020 07/24/2020 03/20/2020 10/19/2019  Falls in the past year? 0 0 0 0 0  Number falls in past yr: 0 0 0 0 0  Injury with Fall? 0 0 0 0 0  Risk for fall due to : - No Fall Risks - - -  Risk for fall due to: Comment - - - - -  Follow up - Falls prevention discussed - - -     Functional Status Survey: Is the patient deaf or have difficulty hearing?: No Does the patient have difficulty seeing, even when wearing glasses/contacts?: No Does the patient have difficulty concentrating, remembering, or making decisions?: No Does the patient have difficulty walking or climbing stairs?: No Does the patient have difficulty dressing or bathing?: No Does the patient have difficulty doing errands alone such as visiting a doctor's office or shopping?: No    Assessment & Plan  1. Type 2 diabetes mellitus with microalbuminuria, without long-term current use of insulin (HCC)  -  POCT HgB A1C  2. Arteriosclerosis of coronary artery  Continue statin therapy   3. Dyslipidemia   4. Essential hypertension   5. Gastroesophageal reflux disease without esophagitis  Under control   6. Mild intermittent asthma without complication   7. Dyslipidemia associated with type 2 diabetes mellitus ([place])  Doing well on statin

## 2020-11-08 ENCOUNTER — Encounter: Payer: Self-pay | Admitting: Family Medicine

## 2020-11-08 ENCOUNTER — Other Ambulatory Visit: Payer: Self-pay

## 2020-11-08 ENCOUNTER — Ambulatory Visit (INDEPENDENT_AMBULATORY_CARE_PROVIDER_SITE_OTHER): Payer: Medicare Other | Admitting: Family Medicine

## 2020-11-08 VITALS — BP 112/60 | HR 73 | Temp 98.5°F | Resp 16 | Ht 62.0 in | Wt 163.0 lb

## 2020-11-08 DIAGNOSIS — I10 Essential (primary) hypertension: Secondary | ICD-10-CM

## 2020-11-08 DIAGNOSIS — I2510 Atherosclerotic heart disease of native coronary artery without angina pectoris: Secondary | ICD-10-CM

## 2020-11-08 DIAGNOSIS — K219 Gastro-esophageal reflux disease without esophagitis: Secondary | ICD-10-CM | POA: Diagnosis not present

## 2020-11-08 DIAGNOSIS — E1129 Type 2 diabetes mellitus with other diabetic kidney complication: Secondary | ICD-10-CM | POA: Diagnosis not present

## 2020-11-08 DIAGNOSIS — E785 Hyperlipidemia, unspecified: Secondary | ICD-10-CM | POA: Diagnosis not present

## 2020-11-08 DIAGNOSIS — J4520 Mild intermittent asthma, uncomplicated: Secondary | ICD-10-CM | POA: Diagnosis not present

## 2020-11-08 DIAGNOSIS — R809 Proteinuria, unspecified: Secondary | ICD-10-CM | POA: Diagnosis not present

## 2020-11-08 DIAGNOSIS — E1169 Type 2 diabetes mellitus with other specified complication: Secondary | ICD-10-CM

## 2020-11-08 LAB — POCT GLYCOSYLATED HEMOGLOBIN (HGB A1C): Hemoglobin A1C: 7 % — AB (ref 4.0–5.6)

## 2020-11-08 NOTE — Patient Instructions (Signed)
Please check shingrix vaccine at local pharmacy

## 2021-01-25 ENCOUNTER — Other Ambulatory Visit: Payer: Self-pay | Admitting: Family Medicine

## 2021-01-25 DIAGNOSIS — K219 Gastro-esophageal reflux disease without esophagitis: Secondary | ICD-10-CM

## 2021-01-25 NOTE — Telephone Encounter (Signed)
Requested Prescriptions  Pending Prescriptions Disp Refills  . omeprazole (PRILOSEC) 40 MG capsule [Pharmacy Med Name: OMEPRAZOLE DR 40 MG CAPSULE] 90 capsule 1    Sig: TAKE 1 CAPSULE (40 MG TOTAL) BY MOUTH DAILY.     Gastroenterology: Proton Pump Inhibitors Passed - 01/25/2021  1:31 AM      Passed - Valid encounter within last 12 months    Recent Outpatient Visits          2 months ago Type 2 diabetes mellitus with microalbuminuria, without long-term current use of insulin [facility])   [facility] [place], Danna Hefty, MD   6 months ago Type 2 diabetes mellitus with microalbuminuria, without long-term current use of insulin [facility])   [facility] [place], Danna Hefty, MD   10 months ago Type 2 diabetes mellitus with microalbuminuria, without long-term current use of insulin [facility])   [facility] [place], Danna Hefty, MD   1 year ago Type 2 diabetes mellitus with microalbuminuria, without long-term current use of insulin [facility])   [facility] [place], Danna Hefty, MD   1 year ago Type 2 diabetes mellitus with microalbuminuria, without long-term current use of insulin [facility])   [facility] Alba Cory, MD      Future Appointments            In 1 month Carlynn Purl, Danna Hefty, MD [facility]   In 9 months  [facility], [facility]

## 2021-02-15 ENCOUNTER — Other Ambulatory Visit: Payer: Self-pay | Admitting: Family Medicine

## 2021-02-15 DIAGNOSIS — I252 Old myocardial infarction: Secondary | ICD-10-CM

## 2021-02-15 DIAGNOSIS — E785 Hyperlipidemia, unspecified: Secondary | ICD-10-CM

## 2021-02-15 DIAGNOSIS — E1129 Type 2 diabetes mellitus with other diabetic kidney complication: Secondary | ICD-10-CM

## 2021-02-15 DIAGNOSIS — I10 Essential (primary) hypertension: Secondary | ICD-10-CM

## 2021-02-15 NOTE — Telephone Encounter (Signed)
Requested Prescriptions  Pending Prescriptions Disp Refills  . metFORMIN (GLUCOPHAGE-XR) 750 MG 24 hr tablet [Pharmacy Med Name: METFORMIN HCL ER 750 MG TABLET] 90 tablet 0    Sig: TAKE 1 TABLET BY MOUTH EVERY DAY WITH BREAKFAST     Endocrinology:  Diabetes - Biguanides Passed - 02/15/2021  2:55 AM      Passed - Cr in normal range and within 360 days    Creat  Date Value Ref Range Status  07/24/2020 0.61 0.60 - 0.93 mg/dL Final    Comment:    For patients >31 years of age, the reference limit for Creatinine is approximately 13% higher for people identified as African-American. .    Creatinine, Urine  Date Value Ref Range Status  07/24/2020 132 20 - 275 mg/dL Final         Passed - HBA1C is between 0 and 7.9 and within 180 days    Hemoglobin A1C  Date Value Ref Range Status  11/08/2020 7.0 (A) 4.0 - 5.6 % Final   HbA1c, POC (controlled diabetic range)  Date Value Ref Range Status  02/16/2019 6.5 0.0 - 7.0 % Final   Hgb A1c MFr Bld  Date Value Ref Range Status  06/23/2019 6.5 (H) <5.7 % of total Hgb Final    Comment:    For someone without known diabetes, a hemoglobin A1c value of 6.5% or greater indicates that they may have  diabetes and this should be confirmed with a follow-up  test. . For someone with known diabetes, a value <7% indicates  that their diabetes is well controlled and a value  greater than or equal to 7% indicates suboptimal  control. A1c targets should be individualized based on  duration of diabetes, age, comorbid conditions, and  other considerations. . Currently, no consensus exists regarding use of hemoglobin A1c for diagnosis of diabetes for children. .          Passed - AA eGFR in normal range and within 360 days    GFR, Est African American  Date Value Ref Range Status  07/24/2020 106 > OR = 60 mL/min/1.86m Final   GFR, Est Non African American  Date Value Ref Range Status  07/24/2020 92 > OR = 60 mL/min/1.774mFinal          Passed - Valid encounter within last 6 months    Recent Outpatient Visits          3 months ago Type 2 diabetes mellitus with microalbuminuria, without long-term current use of insulin ([facility]   6 months ago Type 2 diabetes mellitus with microalbuminuria, without long-term current use of insulin ([facility]   11 months ago Type 2 diabetes mellitus with microalbuminuria, without long-term current use of insulin ([facility]  [facility]   1 year ago Type 2 diabetes mellitus with microalbuminuria, without long-term current use of insulin ([facility]  [facility] StagerMD   1 year ago Type 2 diabetes mellitus with microalbuminuria, without long-term current use of insulin ([facility]  [facility] SizerMD      Future Appointments            In 3 weeks SoAncil BoozerKrDrue StagerMD [facility] In 8 months  [facility]         .  rosuvastatin (CRESTOR) 40 MG tablet [Pharmacy Med Name: ROSUVASTATIN CALCIUM 40 MG TAB] 90 tablet 1    Sig: TAKE 1 TABLET BY MOUTH EVERY DAY     Cardiovascular:  Antilipid - Statins Failed - 02/15/2021  2:55 AM      Failed - HDL in normal range and within 360 days    HDL  Date Value Ref Range Status  07/24/2020 39 (L) > OR = 50 mg/dL Final  11/18/2014 38 (L) >39 mg/dL Final    Comment:    According to ATP-III Guidelines, HDL-C >59 mg/dL is considered a negative risk factor for CHD.          Passed - Total Cholesterol in normal range and within 360 days    Cholesterol, Total  Date Value Ref Range Status  11/18/2014 225 (H) 100 - 199 mg/dL Final   Cholesterol  Date Value Ref Range Status  07/24/2020 123 <200 mg/dL Final         Passed - LDL in normal range and within 360 days    LDL Cholesterol (Calc)  Date Value Ref Range  Status  07/24/2020 64 mg/dL (calc) Final    Comment:    Reference range: <100 . Desirable range <100 mg/dL for primary prevention;   <70 mg/dL for patients with CHD or diabetic patients  with > or = 2 CHD risk factors. Marland Kitchen LDL-C is now calculated using the Martin-Hopkins  calculation, which is a validated novel method providing  better accuracy than the Friedewald equation in the  estimation of LDL-C.  Cresenciano Genre et al. Annamaria Helling. 8466;599(35): 2061-2068  (http://education.[employer].com/faq/FAQ164)          Passed - Triglycerides in normal range and within 360 days    Triglycerides  Date Value Ref Range Status  07/24/2020 113 <150 mg/dL Final         Passed - Patient is not pregnant      Passed - Valid encounter within last 12 months    Recent Outpatient Visits          3 months ago Type 2 diabetes mellitus with microalbuminuria, without long-term current use of insulin [facility])   [facility] [place], Drue Stager, MD   6 months ago Type 2 diabetes mellitus with microalbuminuria, without long-term current use of insulin [facility])   [facility] [place], Drue Stager, MD   11 months ago Type 2 diabetes mellitus with microalbuminuria, without long-term current use of insulin [facility])   [facility] [place], Drue Stager, MD   1 year ago Type 2 diabetes mellitus with microalbuminuria, without long-term current use of insulin [facility])   [facility] [place], Drue Stager, MD   1 year ago Type 2 diabetes mellitus with microalbuminuria, without long-term current use of insulin [facility])   [facility] Steele Sizer, MD      Future Appointments            In 3 weeks Steele Sizer, MD [place]   In 8 months  [place]           . losartan (COZAAR) 50 MG tablet [Pharmacy Med Name: LOSARTAN POTASSIUM 50 MG TAB] 90 tablet 1    Sig: TAKE 1 TABLET BY MOUTH EVERY DAY      Cardiovascular:  Angiotensin Receptor Blockers Failed - 02/15/2021  2:55 AM      Failed - Cr in normal range and within 180 days    Creat  Date Value Ref Range Status  07/24/2020 0.61 0.60 - 0.93 mg/dL Final    Comment:    For patients >68 years of age, the reference limit for Creatinine is approximately 13% higher for people identified as African-American. .    Creatinine, Urine  Date Value Ref Range Status  07/24/2020 132 20 - 275 mg/dL Final         Failed - K in normal range and within 180 days    Potassium  Date Value Ref Range Status  07/24/2020 4.2 3.5 - 5.3 mmol/L Final         Passed - Patient is not pregnant      Passed - Last BP in normal range    BP Readings from Last 1 Encounters:  11/08/20 112/60         Passed - Valid encounter within last 6 months    Recent Outpatient Visits          3 months ago Type 2 diabetes mellitus with microalbuminuria, without long-term current use of insulin [facility])   [facility] [place], Drue Stager, MD   6 months ago Type 2 diabetes mellitus with microalbuminuria, without long-term current use of insulin [facility])   [facility] [place], Drue Stager, MD   11 months ago Type 2 diabetes mellitus with microalbuminuria, without long-term current use of insulin [facility])   [facility] [place], Drue Stager, MD   1 year ago Type 2 diabetes mellitus with microalbuminuria, without long-term current use of insulin [facility])   [facility] [place], Drue Stager, MD   1 year ago Type 2 diabetes mellitus with microalbuminuria, without long-term current use of insulin [facility])   [facility] Steele Sizer, MD      Future Appointments            In 3 weeks Ancil Boozer, Drue Stager, MD [facility] [place]   In 8 months  [facility]

## 2021-02-27 ENCOUNTER — Other Ambulatory Visit: Payer: Self-pay | Admitting: Family Medicine

## 2021-02-27 DIAGNOSIS — Z1231 Encounter for screening mammogram for malignant neoplasm of breast: Secondary | ICD-10-CM

## 2021-03-02 ENCOUNTER — Ambulatory Visit
Admission: EM | Admit: 2021-03-02 | Discharge: 2021-03-02 | Disposition: A | Discharge status: Home / Self Care | Payer: Medicare Other | Attending: Family Medicine | Admitting: Family Medicine

## 2021-03-02 ENCOUNTER — Encounter: Payer: Self-pay | Admitting: Emergency Medicine

## 2021-03-02 ENCOUNTER — Other Ambulatory Visit: Payer: Self-pay

## 2021-03-02 DIAGNOSIS — M67912 Unspecified disorder of synovium and tendon, left shoulder: Secondary | ICD-10-CM

## 2021-03-02 NOTE — Discharge Instructions (Signed)
Rest.  Ice.  Follow up with orthopedics.  Take care  Dr. Adriana Simas

## 2021-03-02 NOTE — ED Provider Notes (Signed)
[facility]    CSN: [id number] Arrival date & time: 03/02/21  1104      History   Chief Complaint Chief Complaint  Patient presents with   Shoulder Pain    left    HPI 70 year old female presents with the above complaint.  Patient has had issues with her shoulders previously.  She has been seen by orthopedics and has responded well to corticosteroid injection in the past.  She states that over the past month she has been bothered by left shoulder pain.  Decreased range of motion and significant pain.  Seems to be worsening.  She had difficulty getting on her close today and her husband had to help her.  No recent fall, trauma, or direct injury.  No other complaints at this time.  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   Primary osteoarthritis of knee 01/24/2015   Asthma, mild intermittent 11/18/2014   Arteriosclerosis of coronary artery 11/18/2014   Insomnia, persistent 11/18/2014   Dyslipidemia 11/18/2014   Acid reflux 11/18/2014   Diabetes type 2, controlled ([place]) 11/18/2014   Genital herpes in women 11/18/2014   Hypertension 11/18/2014   Asymptomatic postmenopausal status 11/18/2014   History of acute myocardial infarction 11/18/2014    Past Surgical History:  Procedure Laterality Date   ABDOMINAL HYSTERECTOMY     BREAST SURGERY     breast reduction   REDUCTION MAMMAPLASTY Bilateral 1995   TUBAL LIGATION      OB History   No obstetric history on file.      Home Medications    Prior to Admission medications   Medication  Sig Start Date End Date Taking? Authorizing Provider  carvedilol (COREG) 6.25 MG tablet Take 1 tablet (6.25 mg total) by mouth 2 (two) times daily. 07/24/20  Yes Sowles, Drue Stager, MD  ezetimibe (ZETIA) 10 MG tablet Take 1 tablet (10 mg total) by mouth daily. 07/24/20  Yes Sowles, Drue Stager, MD  losartan (COZAAR) 50 MG tablet TAKE 1 TABLET BY MOUTH EVERY DAY 02/15/21  Yes Sowles, Drue Stager, MD  metFORMIN (GLUCOPHAGE-XR) 750 MG 24 hr tablet TAKE 1 TABLET BY MOUTH EVERY DAY WITH BREAKFAST 02/15/21  Yes Sowles, Drue Stager, MD  omeprazole (PRILOSEC) 40 MG capsule TAKE 1 CAPSULE (40 MG TOTAL) BY MOUTH DAILY. 01/25/21  Yes Sowles, Drue Stager, MD  rosuvastatin (CRESTOR) 40 MG tablet TAKE 1 TABLET BY MOUTH EVERY DAY 02/15/21  Yes Sowles, Drue Stager, MD  ACCU-CHEK GUIDE test strip USE TO TEST BLOOD SUGAR UP TO 4 TIMES A DAY AS DIRECTED 09/02/19   Steele Sizer, MD  Accu-Chek Softclix Lancets lancets USE TO TEST BLOOD SUGAR UP TO 4 TIMES A DAY AS DIRECTED 06/18/19   Steele Sizer, MD  albuterol (VENTOLIN HFA) 108 (90 Base) MCG/ACT inhaler Inhale 2 puffs into the lungs as needed.    [provider]  Blood Glucose Monitoring Suppl (ACCU-CHEK GUIDE ME) w/Device KIT USE AS DIRECTED 06/23/19   Steele Sizer, MD  colchicine 0.6 MG tablet Take 1 tablet (0.6 mg total) by mouth daily. Take two at  onset of gout and repeat in 1 hour, prn 01/24/15   Steele Sizer, MD    Family History Family History  Problem Relation Age of Onset   Hyperlipidemia Mother    Hypertension Mother    Stroke Mother    Dementia Father    Cancer Sister        breast   Breast cancer Sister 66   Asthma Brother    Depression Brother        bipolar   Heart disease Brother    Heart disease Brother     Social History Social History   Tobacco Use   Smoking status: Never   Smokeless tobacco: Never  Vaping Use   Vaping Use: Never used  Substance Use Topics   Alcohol use: No    Alcohol/week: 0.0 standard drinks   Drug use: No      Allergies   Metformin and related   Review of Systems Review of Systems Per HPI  Physical Exam Triage Vital Signs ED Triage Vitals  Enc Vitals Group     BP 03/02/21 1120 125/66     Pulse Rate 03/02/21 1120 60     Resp 03/02/21 1120 14     Temp 03/02/21 1120 98.4 F (36.9 C)     Temp Source 03/02/21 1120 Oral     SpO2 03/02/21 1120 98 %     Weight 03/02/21 1117 160 lb (72.6 kg)     Height 03/02/21 1117 5' 2"  (1.575 m)     Head Circumference --      Peak Flow --      Pain Score 03/02/21 1117 7     Pain Loc --      Pain Edu? --      Excl. in [place]? --     Updated Vital Signs BP 125/66 (BP Location: Right Arm)   Pulse 60   Temp 98.4 F (36.9 C) (Oral)   Resp 14   Ht 5' 2"  (1.575 m)   Wt 72.6 kg   SpO2 98%   BMI 29.26 kg/m   Visual Acuity Right Eye Distance:   Left Eye Distance:   Bilateral Distance:    Right Eye Near:   Left Eye Near:    Bilateral Near:     Physical Exam Vitals and nursing note reviewed.  Constitutional:      General: She is not in acute distress.    Appearance: Normal appearance. She is not ill-appearing.  HENT:     Head: Normocephalic and atraumatic.  Cardiovascular:     Rate and Rhythm: Normal rate and regular rhythm.  Pulmonary:     Effort: Pulmonary effort is normal. No respiratory distress.  Musculoskeletal:     Comments: Left shoulder: Inspection reveals no abnormalities, atrophy or asymmetry. Palpation with tenderness posteriorly and anteriorly over the bicipital groove. Decreased range of motion. Positive Hawking's.    Neurological:     Mental Status: She is alert.  Psychiatric:        Mood and Affect: Mood normal.        Behavior: Behavior normal.     UC Treatments / Results  Labs (all labs ordered are listed, but only abnormal results are displayed) Labs Reviewed - No data to display  EKG   Radiology No results found.  Procedures Procedures (including critical care time) Left subacromial bursa  injection: Medication: 40 mg of Kenalog, 3 mL of 1% lidocaine without epinephrine, 3 cc of Marcaine 0.25%. Preparation: area cleansed with alcohol x  3   Injection: Landmarks identified Above medication injected into the subacromial space using a standard posterior approach. Patient tolerated well without bleeding or paresthesias   Medications Ordered in UC Medications - No data to display  Initial Impression / Assessment and Plan / UC Course  I have reviewed the triage vital signs and the nursing notes.  Pertinent labs & imaging results that were available during my care of the patient were reviewed by me and considered in my medical decision making (see chart for details).    70 year old female presents with left shoulder pain.  This appears to be consistent with rotator cuff pathology.  Injection performed today as above.  Follow-up with orthopedics.  Final Clinical Impressions(s) / UC Diagnoses   Final diagnoses:  Disorder of left rotator cuff     Discharge Instructions      Rest.  Ice.  Follow up with orthopedics.  Take care  Dr. Lacinda Axon    ED Prescriptions   None    PDMP not reviewed this encounter.   Coral Spikes, [place] 03/02/21 1401

## 2021-03-02 NOTE — ED Triage Notes (Signed)
Patient c/o ongoing left shoulder pain for a month.  Patient denies injury or fall.

## 2021-03-12 ENCOUNTER — Ambulatory Visit: Payer: Medicare Other | Admitting: Family Medicine

## 2021-03-14 DIAGNOSIS — M19012 Primary osteoarthritis, left shoulder: Secondary | ICD-10-CM | POA: Diagnosis not present

## 2021-03-14 DIAGNOSIS — M75102 Unspecified rotator cuff tear or rupture of left shoulder, not specified as traumatic: Secondary | ICD-10-CM | POA: Diagnosis not present

## 2021-03-29 ENCOUNTER — Other Ambulatory Visit: Payer: Self-pay

## 2021-03-29 ENCOUNTER — Ambulatory Visit
Admission: RE | Admit: 2021-03-29 | Discharge: 2021-03-29 | Disposition: A | Discharge status: Home / Self Care | Payer: Medicare Other | Source: Ambulatory Visit | Attending: Family Medicine | Admitting: Family Medicine

## 2021-03-29 DIAGNOSIS — Z1231 Encounter for screening mammogram for malignant neoplasm of breast: Secondary | ICD-10-CM | POA: Insufficient documentation

## 2021-05-08 NOTE — Progress Notes (Signed)
Name: Dawn Werner   MRN: [id number]    DOB: 04-21-1951   Date:05/09/2021       Progress Note  Subjective  Chief Complaint  Follow Up  HPI  DMII:  Her  hgbA1C went down from 7.8%  to 7.3%, up to 7.4%,  7.3% ,6.9%, 6.8%, 7.5% 6.8% down to 6.5% , 6.4 % 7.1%,down to 6.9 % ,7 % and today 6.8 % . She has improved her diet.  She  has history of nephropathy and is on ARB, no neuropathy She denies polyphagia, polydipsia or polyuria. She is taking Metformin ER to 750 mg ER without side effects. Compliant with statin therapy. Last urine micro was negative    OA both knees and left hip: she states symptoms improved, seen by Ortho in the past but not bothersome enough at this time  Hyperlipidemia: she is back on Crestor 40 mg and Zetia 10 mg daily Denies myalgias Last LDL at goal at 64    HTN:  No chest pain, palpitation or SOB , taking medication as prescribed. Explained bp has been towards low end of normal, we will decrease dose of losartan from 50 mg to 25 mg   CAD: no episodes, s/p history of MI in 2009, taking aspirin daily, beta-blocker and statin therapy. No chest pain or  decrease in exercise tolerance. No longer seeing a cardiologist.    Asthma Mild Intermittent: she has been doing well, no cough, wheezing or SOB lately, she has to use Breo daily during the month of March due to high pollen count , she still has rescue inhaler at home but has not used in a long time   Atherosclerosis of aorta: on statin but stopped aspirin, advised to resume 81 mg daily  last LDL at goal Continue medication   GERD: doing well on Omeprazole . 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) 11/18/2014   Genital herpes in women 11/18/2014   Hypertension 11/18/2014   Asymptomatic postmenopausal status 11/18/2014   History of acute myocardial infarction 11/18/2014    Past Surgical History:  Procedure Laterality Date   ABDOMINAL HYSTERECTOMY     BREAST SURGERY     breast reduction   REDUCTION MAMMAPLASTY Bilateral 1995   TUBAL LIGATION      Family History  Problem Relation Age of Onset   Hyperlipidemia Mother    Hypertension Mother    Stroke Mother    Dementia Father    Cancer Sister        breast   Breast cancer Sister 75   Asthma Brother    Depression Brother        bipolar   Heart disease Brother    Heart disease Brother     Social History   Tobacco Use   Smoking status: Never   Smokeless tobacco: Never  Substance Use Topics   Alcohol use: No    Alcohol/week: 0.0 standard drinks     Current Outpatient Medications:    ACCU-CHEK GUIDE test strip,  USE TO TEST BLOOD SUGAR UP TO 4 TIMES A DAY AS DIRECTED, Disp: 100 strip, Rfl: 11   Accu-Chek Softclix Lancets lancets, USE TO TEST BLOOD SUGAR UP TO 4 TIMES A DAY AS DIRECTED, Disp: 100 each, Rfl: 0   albuterol (VENTOLIN HFA) 108 (90 Base) MCG/ACT inhaler, Inhale 2 puffs into the lungs as needed., Disp: , Rfl:    aspirin 81 MG EC tablet, Take 1 tablet (81 mg total) by mouth daily. Swallow whole., Disp: 30 tablet, Rfl: 12   Blood Glucose Monitoring Suppl (ACCU-CHEK GUIDE ME) w/Device KIT, USE AS DIRECTED, Disp: 1 kit, Rfl: 0   omeprazole (PRILOSEC) 40 MG capsule, TAKE 1 CAPSULE (40 MG TOTAL) BY MOUTH DAILY., Disp: 90 capsule, Rfl: 1   rosuvastatin (CRESTOR) 40 MG tablet, TAKE 1 TABLET BY MOUTH EVERY DAY, Disp: 90 tablet, Rfl: 1   carvedilol (COREG) 6.25 MG tablet, Take 1 tablet (6.25 mg total) by mouth 2  (two) times daily., Disp: 180 tablet, Rfl: 1   ezetimibe (ZETIA) 10 MG tablet, Take 1 tablet (10 mg total) by mouth daily., Disp: 90 tablet, Rfl: 1   losartan (COZAAR) 25 MG tablet, Take 1 tablet (25 mg total) by mouth daily., Disp: 90 tablet, Rfl: 1   metFORMIN (GLUCOPHAGE-XR) 750 MG 24 hr tablet, TAKE 1 TABLET BY MOUTH EVERY DAY WITH BREAKFAST, Disp: 90 tablet, Rfl: 1  Allergies  Allergen Reactions   Metformin And Related Diarrhea    And nausea. Tolerates XR formulation    I personally reviewed active problem list, medication list, allergies, family history, social history, health maintenance with the patient/caregiver today.   ROS  Constitutional: Negative for fever or weight change.  Respiratory: Negative for cough and shortness of breath.   Cardiovascular: Negative for chest pain or palpitations.  Gastrointestinal: Negative for abdominal pain, no bowel changes.  Musculoskeletal: Negative for gait problem or joint swelling.  Skin: Negative for rash.  Neurological: Negative for dizziness or headache.  No other specific complaints in a complete review of systems (except as listed in HPI above).   Objective  Vitals:   05/09/21 1328  BP: 116/68  Pulse: 70  Resp: 16  Temp: 98.1 F (36.7 C)  SpO2: 97%  Weight: 158 lb (71.7 kg)  Height: _0  (1.575 m)    Body mass index is 28.9 kg/m.  Physical Exam  Constitutional: Patient appears well-developed and well-nourished. Obese  No distress.  HEENT: head atraumatic, normocephalic, pupils equal and reactive to light, neck supple, throat within normal limits Cardiovascular: Normal rate, regular rhythm and normal heart sounds.  No murmur heard. No BLE edema. Pulmonary/Chest: Effort normal and breath sounds normal. No respiratory distress. Abdominal: Soft.  There is no tenderness. Psychiatric: Patient has a normal mood and affect. behavior is normal. Judgment and thought content normal.   Diabetic Foot Exam: Diabetic Foot Exam  - Simple   Simple Foot Form Diabetic Foot exam was performed with the following findings: Yes 05/09/2021  2:08 PM  Visual Inspection No deformities, no ulcerations, no other skin breakdown bilaterally: Yes Sensation Testing Intact to touch and monofilament testing bilaterally: Yes Pulse Check Posterior Tibialis and Dorsalis pulse intact bilaterally: Yes Comments      PHQ2/9: Depression screen [facility] 2/9 05/09/2021 11/08/2020 11/07/2020 07/24/2020 03/20/2020  Decreased Interest 0 0 0 0 0  Down, Depressed, Hopeless 0 0 0 0 0  PHQ - 2 Score 0 0 0 0 0  Altered sleeping 0 - - - -  Tired, decreased energy 0 - - - -  Change in appetite 0 - - - -  Feeling bad or failure about yourself  0 - - - -  Trouble concentrating 0 - - - -  Moving slowly or fidgety/restless 0 - - - -  Suicidal thoughts 0 - - - -  PHQ-9 Score 0 - - - -  Difficult doing work/chores - - - - -  Some recent data might be hidden    phq 9 is negative   Fall Risk: Fall Risk  05/09/2021 11/08/2020 11/07/2020 07/24/2020 03/20/2020  Falls in the past year? 0 0 0 0 0  Number falls in past yr: 0 0 0 0 0  Injury with Fall? 0 0 0 0 0  Risk for fall due to : No Fall Risks - No Fall Risks - -  Risk for fall due to: Comment - - - - -  Follow up Falls prevention discussed - Falls prevention discussed - -      Functional Status Survey: Is the patient deaf or have difficulty hearing?: No Does the patient have difficulty seeing, even when wearing glasses/contacts?: No Does the patient have difficulty concentrating, remembering, or making decisions?: No Does the patient have difficulty walking or climbing stairs?: No Does the patient have difficulty dressing or bathing?: No Does the patient have difficulty doing errands alone such as visiting a doctor's office or shopping?: No    Assessment & Plan  1. Type 2 diabetes mellitus with microalbuminuria, without long-term current use of insulin (HCC)  - POCT HgB A1C - HM Diabetes  Foot Exam - losartan (COZAAR) 25 MG tablet; Take 1 tablet (25 mg total) by mouth daily.  Dispense: 90 tablet; Refill: 1 - metFORMIN (GLUCOPHAGE-XR) 750 MG 24 hr tablet; TAKE 1 TABLET BY MOUTH EVERY DAY WITH BREAKFAST  Dispense: 90 tablet; Refill: 1  2. Arteriosclerosis of coronary artery  - ezetimibe (ZETIA) 10 MG tablet; Take 1 tablet (10 mg total) by mouth daily.  Dispense: 90 tablet; Refill: 1  3. Essential hypertension  - losartan (COZAAR) 25 MG tablet; Take 1 tablet (25 mg total) by mouth daily.  Dispense: 90 tablet; Refill: 1 - carvedilol (COREG) 6.25 MG tablet; Take 1 tablet (6.25 mg total) by mouth 2 (two) times daily.  Dispense: 180 tablet; Refill: 1  4. Dyslipidemia  - ezetimibe (ZETIA) 10 MG tablet; Take 1 tablet (10 mg total) by mouth daily.  Dispense: 90 tablet; Refill: 1  5. Mild intermittent asthma without complication   6. History of acute myocardial infarction  - aspirin 81 MG EC tablet; Take 1 tablet (81 mg total) by mouth daily. Swallow whole.  Dispense: 30 tablet; Refill: 12 - losartan (COZAAR) 25 MG tablet; Take 1 tablet (25 mg total) by mouth daily.  Dispense: 90 tablet; Refill: 1 - carvedilol (COREG) 6.25 MG tablet; Take 1 tablet (6.25 mg total) by mouth 2 (two) times daily.  Dispense: 180 tablet; Refill: 1  7. Dyslipidemia associated with type 2 diabetes mellitus ([place])   8. Gastroesophageal reflux disease without esophagitis   9. Insomnia, persistent   She is doing well, advised to take a quick nap after lunch

## 2021-05-09 ENCOUNTER — Ambulatory Visit (INDEPENDENT_AMBULATORY_CARE_PROVIDER_SITE_OTHER): Payer: Medicare Other | Admitting: Family Medicine

## 2021-05-09 ENCOUNTER — Encounter: Payer: Self-pay | Admitting: Family Medicine

## 2021-05-09 ENCOUNTER — Other Ambulatory Visit: Payer: Self-pay

## 2021-05-09 VITALS — BP 116/68 | HR 70 | Temp 98.1°F | Resp 16 | Ht 62.0 in | Wt 158.0 lb

## 2021-05-09 DIAGNOSIS — I252 Old myocardial infarction: Secondary | ICD-10-CM | POA: Diagnosis not present

## 2021-05-09 DIAGNOSIS — J4520 Mild intermittent asthma, uncomplicated: Secondary | ICD-10-CM | POA: Diagnosis not present

## 2021-05-09 DIAGNOSIS — K219 Gastro-esophageal reflux disease without esophagitis: Secondary | ICD-10-CM

## 2021-05-09 DIAGNOSIS — E1129 Type 2 diabetes mellitus with other diabetic kidney complication: Secondary | ICD-10-CM

## 2021-05-09 DIAGNOSIS — E1169 Type 2 diabetes mellitus with other specified complication: Secondary | ICD-10-CM

## 2021-05-09 DIAGNOSIS — G4700 Insomnia, unspecified: Secondary | ICD-10-CM

## 2021-05-09 DIAGNOSIS — R809 Proteinuria, unspecified: Secondary | ICD-10-CM

## 2021-05-09 DIAGNOSIS — I2510 Atherosclerotic heart disease of native coronary artery without angina pectoris: Secondary | ICD-10-CM | POA: Diagnosis not present

## 2021-05-09 DIAGNOSIS — I10 Essential (primary) hypertension: Secondary | ICD-10-CM

## 2021-05-09 DIAGNOSIS — E785 Hyperlipidemia, unspecified: Secondary | ICD-10-CM | POA: Diagnosis not present

## 2021-05-09 LAB — POCT GLYCOSYLATED HEMOGLOBIN (HGB A1C): Hemoglobin A1C: 6.8 % — AB (ref 4.0–5.6)

## 2021-05-09 MED ORDER — LOSARTAN POTASSIUM 25 MG PO TABS: 25.0000 mg | ORAL_TABLET | Freq: Every day | ORAL | 1 refills | Status: DC

## 2021-05-09 MED ORDER — CARVEDILOL 6.25 MG PO TABS
6.2500 mg | ORAL_TABLET | Freq: Two times a day (BID) | ORAL | 1 refills | Status: DC
Start: 2021-05-09 — End: 2021-09-07

## 2021-05-09 MED ORDER — EZETIMIBE 10 MG PO TABS: 10.0000 mg | ORAL_TABLET | Freq: Every day | ORAL | 1 refills | Status: DC

## 2021-05-09 MED ORDER — ASPIRIN 81 MG PO TBEC: 81.0000 mg | DELAYED_RELEASE_TABLET | Freq: Every day | ORAL | 12 refills | Status: DC

## 2021-05-09 MED ORDER — METFORMIN HCL ER 750 MG PO TB24: ORAL_TABLET | ORAL | 1 refills | Status: DC

## 2021-09-06 NOTE — Progress Notes (Signed)
Name: Dawn Werner   MRN: [id number]    DOB: Nov 21, 1950   Date:09/07/2021 ? ?     Progress Note ? ?Subjective ? ?Chief Complaint ? ?Follow Up ? ?HPI ? ?DMII:  Her  hgbA1C went down from 7.8%  to 7.3%, up to 7.4%,  7.3% ,6.9%, 6.8%, 7.5% 6.8% down to 6.5% , 6.4 % 7.1%,down to 6.9 % ,7 % last visit it was 6.8 % . She has improved her diet.  She  has history of nephropathy and is on ARB we will recheck urine micro today.She denies polyphagia, polydipsia or polyuria. She is taking Metformin ER to 750 mg ER and denies side effects of medications Compliant with statin therapy.  ?  ?OA both knees and left hip: she states symptoms improved, seen by Ortho in the past , she is active , she has a rose garden  ? ?Hyperlipidemia: she is back on Crestor 40 mg and Zetia 10 mg daily Denies myalgias Last LDL at goal at 64 , she is due for repeat labs today  ?  ?HTN:  No chest pain, palpitation or SOB , taking medication as prescribed. Explained bp has been towards low end of normal, decrease dose of Losartan from 50 mg to 25 mg last visit and we will decrease carvedilol today from 6.25 to 3.125  ? ?CAD: no episodes, s/p history of MI in 2009, taking aspirin daily,ARB,  beta-blocker and statin therapy. No chest pain or  decrease in exercise tolerance. No longer seeing a cardiologist.  ?  ?Asthma Mild Intermittent: she has been doing well, no cough, wheezing or SOB lately, she has to use Breo daily during the month of March due to high pollen count , she still has rescue inhaler at home and does not need a refill ? ?Atherosclerosis of aorta: on statin, aspirin  81 mg daily  last LDL at goal Continue medication ?  ?GERD: doing well on Omeprazole . No heartburn or indigestion  ? ?Insomnia: she had weaned self off Ambien but has been taking otc sleep aids and still only able to fall asleep around 3 am and has interrupted sleep. She had some old Ambien pills and has been taking a third of a tablet and it has helped significantly, she  would like to resume taking it, she will try prn only. She is aware of side effects.  ? ?Patient Active Problem List  ? Diagnosis Date Noted  ? Diabetes mellitus with coincident hypertension ([place]) 07/24/2020  ? Belching 08/18/2018  ? 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 Derangement of posterior horn of lateral meniscus of right knee 01/10/2016  ? 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 Primary osteoarthritis of knee 01/24/2015  ? Asthma, mild intermittent 11/18/2014  ? Arteriosclerosis of coronary artery 11/18/2014  ? Insomnia, persistent 11/18/2014  ? Dyslipidemia 11/18/2014  ? Acid reflux 11/18/2014  ? Diabetes type 2, controlled ([place]) 11/18/2014  ? Genital herpes in women 11/18/2014  ? Hypertension 11/18/2014  ? Asymptomatic postmenopausal status 11/18/2014  ? History of acute myocardial infarction 11/18/2014  ? ? ?Past Surgical History:  ?Procedure Laterality Date  ? ABDOMINAL HYSTERECTOMY    ? BREAST SURGERY    ? breast reduction  ? REDUCTION MAMMAPLASTY Bilateral 1995  ? TUBAL LIGATION    ? ? ?Family History  ?Problem Relation Age of Onset  ? Hyperlipidemia Mother   ? Hypertension Mother   ? Stroke Mother   ? Dementia Father   ? Cancer Sister   ?  breast  ? Breast cancer Sister 80  ? Asthma Brother   ? Depression Brother   ?     bipolar  ? Heart disease Brother   ? Heart disease Brother   ? ? ?Social History  ? ?Tobacco Use  ? Smoking status: Never  ? Smokeless tobacco: Never  ?Substance Use Topics  ? Alcohol use: No  ?  Alcohol/week: 0.0 standard drinks  ? ? ? ?Current Outpatient Medications:  ?  ACCU-CHEK GUIDE test strip, USE TO TEST BLOOD SUGAR UP TO 4 TIMES A DAY AS DIRECTED, Disp: 100 strip, Rfl: 11 ?  Accu-Chek Softclix Lancets lancets, USE TO TEST BLOOD SUGAR UP TO 4 TIMES A DAY AS DIRECTED, Disp: 100 each, Rfl: 0 ?  albuterol (VENTOLIN HFA) 108 (90 Base) MCG/ACT inhaler, Inhale 2 puffs into the lungs as needed., Disp: , Rfl:  ?  aspirin 81 MG EC tablet, Take 1 tablet (81 mg total) by mouth daily. Swallow  whole., Disp: 30 tablet, Rfl: 12 ?  Blood Glucose Monitoring Suppl (ACCU-CHEK GUIDE ME) w/Device KIT, USE AS DIRECTED, Disp: 1 kit, Rfl: 0 ?  ezetimibe (ZETIA) 10 MG tablet, Take 1 tablet (10 mg total) by mouth daily., Disp: 90 tablet, Rfl: 1 ?  zolpidem (AMBIEN) 10 MG tablet, Take 0.5-1 tablets (5-10 mg total) by mouth at bedtime as needed for sleep., Disp: 90 tablet, Rfl: 1 ?  Zoster Vaccine Adjuvanted [facility]) injection, Inject 0.5 mLs into the muscle once for 1 dose., Disp: 0.5 mL, Rfl: 1 ?  carvedilol (COREG) 3.125 MG tablet, Take 1 tablet (3.125 mg total) by mouth 2 (two) times daily., Disp: 180 tablet, Rfl: 1 ?  losartan (COZAAR) 25 MG tablet, Take 1 tablet (25 mg total) by mouth daily., Disp: 90 tablet, Rfl: 1 ?  metFORMIN (GLUCOPHAGE-XR) 750 MG 24 hr tablet, TAKE 1 TABLET BY MOUTH EVERY DAY WITH BREAKFAST, Disp: 90 tablet, Rfl: 1 ?  omeprazole (PRILOSEC) 40 MG capsule, Take 1 capsule (40 mg total) by mouth daily., Disp: 90 capsule, Rfl: 1 ?  rosuvastatin (CRESTOR) 40 MG tablet, Take 1 tablet (40 mg total) by mouth daily., Disp: 90 tablet, Rfl: 1 ? ?Allergies  ?Allergen Reactions  ? Metformin And Related Diarrhea  ?  And nausea. Tolerates XR formulation  ? ? ?I personally reviewed active problem list, medication list, allergies, family history, social history, health maintenance with the patient/caregiver today. ? ? ?ROS ? ?Constitutional: Negative for fever or weight change.  ?Respiratory: Negative for cough and shortness of breath.   ?Cardiovascular: Negative for chest pain or palpitations.  ?Gastrointestinal: Negative for abdominal pain, no bowel changes.  ?Musculoskeletal: Negative for gait problem or joint swelling.  ?Skin: Negative for rash.  ?Neurological: Negative for dizziness or headache.  ?No other specific complaints in a complete review of systems (except as listed in HPI above).  ? ?Objective ? ?Vitals:  ? 09/07/21 1315  ?BP: 118/72  ?Pulse: 86  ?Resp: 16  ?Temp: 97.7 ?F (36.5 ?C)  ?TempSrc:  Oral  ?SpO2: 99%  ?Weight: 159 lb 1.6 oz (72.2 kg)  ?Height: 5' 4"  (1.626 m)  ? ? ?Body mass index is 27.31 kg/m?. ? ?Physical Exam ? ?Constitutional: Patient appears well-developed and well-nourished. No distress.  ?HEENT: head atraumatic, normocephalic, pupils equal and reactive to light, neck supple ?Cardiovascular: Normal rate, regular rhythm and normal heart sounds.  No murmur heard. No BLE edema. ?Pulmonary/Chest: Effort normal and breath sounds normal. No respiratory distress. ?Abdominal: Soft.  There is no tenderness. ?  Psychiatric: Patient has a normal mood and affect. behavior is normal. Judgment and thought content normal.  ? ?PHQ2/9: ? ?  09/07/2021  ?  1:21 PM 05/09/2021  ?  1:26 PM 11/08/2020  ?  9:32 AM 11/07/2020  ?  3:48 PM 07/24/2020  ? 10:15 AM  ?Depression screen PHQ 2/9  ?Decreased Interest 0 0 0 0 0  ?Down, Depressed, Hopeless 0 0 0 0 0  ?PHQ - 2 Score 0 0 0 0 0  ?Altered sleeping  0     ?Tired, decreased energy  0     ?Change in appetite  0     ?Feeling bad or failure about yourself   0     ?Trouble concentrating  0     ?Moving slowly or fidgety/restless  0     ?Suicidal thoughts  0     ?PHQ-9 Score  0     ?  ?phq 9 is negative ? ? ?Fall Risk: ? ?  09/07/2021  ?  1:21 PM 05/09/2021  ?  1:26 PM 11/08/2020  ?  9:32 AM 11/07/2020  ?  3:50 PM 07/24/2020  ? 10:15 AM  ?Fall Risk   ?Falls in the past year? 0 0 0 0 0  ?Number falls in past yr:  0 0 0 0  ?Injury with Fall?  0 0 0 0  ?Risk for fall due to :  No Fall Risks  No Fall Risks   ?Follow up Falls prevention discussed Falls prevention discussed  Falls prevention discussed   ? ? ? ? ?Functional Status Survey: ?Is the patient deaf or have difficulty hearing?: No ?Does the patient have difficulty seeing, even when wearing glasses/contacts?: No ?Does the patient have difficulty concentrating, remembering, or making decisions?: No ?Does the patient have difficulty walking or climbing stairs?: No ?Does the patient have difficulty dressing or bathing?: No ?Does  the patient have difficulty doing errands alone such as visiting a doctor's office or shopping?: No ? ? ? ?Assessment & Plan ? ?1. Type 2 diabetes mellitus with microalbuminuria, without long-term current [place]

## 2021-09-07 ENCOUNTER — Ambulatory Visit (INDEPENDENT_AMBULATORY_CARE_PROVIDER_SITE_OTHER): Payer: Medicare Other | Admitting: Family Medicine

## 2021-09-07 ENCOUNTER — Encounter: Payer: Self-pay | Admitting: Family Medicine

## 2021-09-07 VITALS — BP 118/72 | HR 86 | Temp 97.7°F | Resp 16 | Ht 64.0 in | Wt 159.1 lb

## 2021-09-07 DIAGNOSIS — Z23 Encounter for immunization: Secondary | ICD-10-CM

## 2021-09-07 DIAGNOSIS — I252 Old myocardial infarction: Secondary | ICD-10-CM

## 2021-09-07 DIAGNOSIS — I2510 Atherosclerotic heart disease of native coronary artery without angina pectoris: Secondary | ICD-10-CM

## 2021-09-07 DIAGNOSIS — E1129 Type 2 diabetes mellitus with other diabetic kidney complication: Secondary | ICD-10-CM

## 2021-09-07 DIAGNOSIS — K219 Gastro-esophageal reflux disease without esophagitis: Secondary | ICD-10-CM

## 2021-09-07 DIAGNOSIS — G4700 Insomnia, unspecified: Secondary | ICD-10-CM | POA: Diagnosis not present

## 2021-09-07 DIAGNOSIS — I10 Essential (primary) hypertension: Secondary | ICD-10-CM

## 2021-09-07 DIAGNOSIS — R809 Proteinuria, unspecified: Secondary | ICD-10-CM

## 2021-09-07 DIAGNOSIS — E1169 Type 2 diabetes mellitus with other specified complication: Secondary | ICD-10-CM

## 2021-09-07 DIAGNOSIS — E785 Hyperlipidemia, unspecified: Secondary | ICD-10-CM

## 2021-09-07 MED ORDER — LOSARTAN POTASSIUM 25 MG PO TABS: 25.0000 mg | ORAL_TABLET | Freq: Every day | ORAL | 1 refills | Status: DC

## 2021-09-07 MED ORDER — OMEPRAZOLE 40 MG PO CPDR: 40.0000 mg | DELAYED_RELEASE_CAPSULE | Freq: Every day | ORAL | 1 refills | Status: DC

## 2021-09-07 MED ORDER — CARVEDILOL 3.125 MG PO TABS: 3.1250 mg | ORAL_TABLET | Freq: Two times a day (BID) | ORAL | 1 refills | Status: DC

## 2021-09-07 MED ORDER — ZOLPIDEM TARTRATE 10 MG PO TABS: 5.0000 mg | ORAL_TABLET | Freq: Every evening | ORAL | 1 refills | Status: DC | PRN

## 2021-09-07 MED ORDER — ROSUVASTATIN CALCIUM 40 MG PO TABS: 40.0000 mg | ORAL_TABLET | Freq: Every day | ORAL | 1 refills | Status: DC

## 2021-09-07 MED ORDER — SHINGRIX 50 MCG/0.5ML IM SUSR: 0.5000 mL | Freq: Once | INTRAMUSCULAR | 1 refills | Status: AC

## 2021-09-07 MED ORDER — METFORMIN HCL ER 750 MG PO TB24: ORAL_TABLET | ORAL | 1 refills | Status: DC

## 2021-09-08 LAB — COMPLETE METABOLIC PANEL WITH GFR
AG Ratio: 1.9 (calc) (ref 1.0–2.5)
ALT: 22 U/L (ref 6–29)
AST: 21 U/L (ref 10–35)
Albumin: 4.8 g/dL (ref 3.6–5.1)
Alkaline phosphatase (APISO): 68 U/L (ref 37–153)
BUN: 7 mg/dL (ref 7–25)
CO2: 28 mmol/L (ref 20–32)
Calcium: 9.9 mg/dL (ref 8.6–10.4)
Chloride: 102 mmol/L (ref 98–110)
Creat: 0.64 mg/dL (ref 0.60–1.00)
Globulin: 2.5 g/dL (calc) (ref 1.9–3.7)
Glucose, Bld: 107 mg/dL — ABNORMAL HIGH (ref 65–99)
Potassium: 3.8 mmol/L (ref 3.5–5.3)
Sodium: 141 mmol/L (ref 135–146)
Total Bilirubin: 0.4 mg/dL (ref 0.2–1.2)
Total Protein: 7.3 g/dL (ref 6.1–8.1)
eGFR: 94 mL/min/{1.73_m2} (ref >=60)

## 2021-09-08 LAB — CBC WITH DIFFERENTIAL/PLATELET
Absolute Monocytes: 539 cells/uL (ref 200–950)
Basophils Absolute: 54 cells/uL (ref 0–200)
Basophils Relative: 0.7 %
Eosinophils Absolute: 62 cells/uL (ref 15–500)
Eosinophils Relative: 0.8 %
HCT: 40.5 % (ref 35.0–45.0)
Hemoglobin: 13.3 g/dL (ref 11.7–15.5)
Lymphs Abs: 2202 cells/uL (ref 850–3900)
MCH: 28.2 pg (ref 27.0–33.0)
MCHC: 32.8 g/dL (ref 32.0–36.0)
MCV: 86 fL (ref 80.0–100.0)
MPV: 10 fL (ref 7.5–12.5)
Monocytes Relative: 7 %
Neutro Abs: 4843 cells/uL (ref 1500–7800)
Neutrophils Relative %: 62.9 %
Platelets: 285 10*3/uL (ref 140–400)
RBC: 4.71 10*6/uL (ref 3.80–5.10)
RDW: 11.8 % (ref 11.0–15.0)
Total Lymphocyte: 28.6 %
WBC: 7.7 10*3/uL (ref 3.8–10.8)

## 2021-09-08 LAB — HEMOGLOBIN A1C
Hgb A1c MFr Bld: 7.1 % of total Hgb — ABNORMAL HIGH (ref <5.7)
Mean Plasma Glucose: 157 mg/dL
eAG (mmol/L): 8.7 mmol/L

## 2021-09-08 LAB — MICROALBUMIN, URINE: Microalb, Ur: 16 mg/dL

## 2021-09-08 LAB — LIPID PANEL
Cholesterol: 124 mg/dL (ref <200)
HDL: 40 mg/dL — ABNORMAL LOW (ref >=50)
LDL Cholesterol (Calc): 66 mg/dL (calc)
Non-HDL Cholesterol (Calc): 84 mg/dL (calc) (ref <130)
Total CHOL/HDL Ratio: 3.1 (calc) (ref <5.0)
Triglycerides: 97 mg/dL (ref <150)

## 2021-09-10 ENCOUNTER — Ambulatory Visit: Payer: Self-pay | Admitting: *Deleted

## 2021-09-10 NOTE — Progress Notes (Signed)
Left voicemal for return call to go over labs. CRM created, Nurse triage given permission to relay if patient returns call.  ?

## 2021-09-10 NOTE — Telephone Encounter (Signed)
Pt called in and was given her lab result message from Dr. Carlynn Purl dated 09/10/2021 at 9:05 AM. ?No questions. ?

## 2021-09-18 ENCOUNTER — Telehealth: Payer: Self-pay

## 2021-09-18 ENCOUNTER — Other Ambulatory Visit: Payer: Self-pay

## 2021-09-18 DIAGNOSIS — E1129 Type 2 diabetes mellitus with other diabetic kidney complication: Secondary | ICD-10-CM

## 2021-09-18 MED ORDER — ACCU-CHEK GUIDE ME W/DEVICE KIT: PACK | 0 refills | Status: AC

## 2021-09-18 NOTE — Telephone Encounter (Signed)
New meter RX sent  ?

## 2021-09-18 NOTE — Telephone Encounter (Signed)
Copied from CRM [id number]. Topic: General - Other >> Sep 18, 2021  9:37 AM Marylen Ponto wrote: Reason for CRM: Pt stated she dropped her glucose meter and stepped on it so she will need a Rx sent to [id number] [place], [place] - [address] AT [facility] Phone: [phone]  Fax: [phone]

## 2021-10-16 LAB — HM DIABETES EYE EXAM

## 2021-10-26 NOTE — Progress Notes (Signed)
Name: Dawn Werner   MRN: [id number]    DOB: 12-Aug-1950   Date:10/29/2021       Progress Note  Subjective  Chief Complaint  Consult  HPI  MDD marital problems: she has been married for the past 68 years, however over the past 10 years when they became members of a larger church. He is a deacon and he likes being the center or attention. He is a Librarian, academic , and about 15 years ago she cheated on her. She has been crying daily and lost weight over the past 2 months. She feels unappreciated, he is not kind to her.counseling. She would like to start medication. Taking Ambien prn for sleep also She had episodes of depression like this before   Urinary frequency: started about 2 weeks ago, some urgency, followed by supra pubic pain and some lower back pain. 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   Primary osteoarthritis of knee 01/24/2015   Asthma, mild intermittent 11/18/2014   Arteriosclerosis of coronary artery 11/18/2014   Insomnia, persistent 11/18/2014   Dyslipidemia 11/18/2014   Acid reflux 11/18/2014   Diabetes type 2, controlled ([place]) 11/18/2014   Genital herpes in women 11/18/2014   Hypertension, benign 11/18/2014   Asymptomatic postmenopausal status 11/18/2014   History of acute myocardial infarction 11/18/2014    Past Surgical History:  Procedure Laterality Date   ABDOMINAL HYSTERECTOMY     BREAST SURGERY     breast reduction   REDUCTION MAMMAPLASTY Bilateral 1995   TUBAL LIGATION      Family History  Problem Relation Age of Onset   Hyperlipidemia Mother    Hypertension Mother    Stroke Mother    Dementia Father    Cancer Sister         breast   Breast cancer Sister 47   Asthma Brother    Depression Brother        bipolar   Heart disease Brother    Heart disease Brother     Social History   Tobacco Use   Smoking status: Never   Smokeless tobacco: Never  Substance Use Topics   Alcohol use: No    Alcohol/week: 0.0 standard drinks     Current Outpatient Medications:    ACCU-CHEK GUIDE test strip, USE TO TEST BLOOD SUGAR UP TO 4 TIMES A DAY AS DIRECTED, Disp: 100 strip, Rfl: 11   Accu-Chek Softclix Lancets lancets, USE TO TEST BLOOD SUGAR UP TO 4 TIMES A DAY AS DIRECTED, Disp: 100 each, Rfl: 0   albuterol (VENTOLIN HFA) 108 (90 Base) MCG/ACT inhaler, Inhale 2 puffs into the lungs as needed., Disp: , Rfl:    aspirin 81 MG EC tablet, Take 1 tablet (81 mg total) by mouth daily. Swallow whole., Disp: 30 tablet, Rfl: 12   Blood Glucose Monitoring Suppl (ACCU-CHEK GUIDE ME) w/Device KIT, USE AS DIRECTED, Disp: 1 kit, Rfl: 0   carvedilol (COREG) 3.125 MG tablet, Take 1 tablet (3.125 mg total) by mouth 2 (two) times daily., Disp: 180 tablet, Rfl: 1   ezetimibe (ZETIA) 10 MG tablet, Take 1 tablet (10 mg total) by mouth daily., Disp: 90 tablet, Rfl: 1   losartan (  COZAAR) 25 MG tablet, Take 1 tablet (25 mg total) by mouth daily., Disp: 90 tablet, Rfl: 1   metFORMIN (GLUCOPHAGE-XR) 750 MG 24 hr tablet, TAKE 1 TABLET BY MOUTH EVERY DAY WITH BREAKFAST, Disp: 90 tablet, Rfl: 1   omeprazole (PRILOSEC) 40 MG capsule, Take 1 capsule (40 mg total) by mouth daily., Disp: 90 capsule, Rfl: 1   rosuvastatin (CRESTOR) 40 MG tablet, Take 1 tablet (40 mg total) by mouth daily., Disp: 90 tablet, Rfl: 1   zolpidem (AMBIEN) 10 MG tablet, Take 0.5-1 tablets (5-10 mg total) by mouth at bedtime as needed for sleep., Disp: 90 tablet, Rfl: 1  Allergies  Allergen Reactions   Metformin And Related Diarrhea    And nausea. Tolerates XR formulation    I personally reviewed active problem list, medication list, allergies, family history, social  history, health maintenance with the patient/caregiver today.   ROS  Ten systems reviewed and is negative except as mentioned in HPI   Objective  Vitals:   10/29/21 1416  BP: 116/64  Pulse: 87  Resp: 16  SpO2: 98%  Weight: 154 lb (69.9 kg)  Height: _0  (1.6 m)    Body mass index is 27.28 kg/m.  Physical Exam  Constitutional: Patient appears well-developed and well-nourished.  No distress.  HEENT: head atraumatic, normocephalic, pupils equal and reactive to light, neck supple Cardiovascular: Normal rate, regular rhythm and normal heart sounds.  No murmur heard. No BLE edema. Pulmonary/Chest: Effort normal and breath sounds normal. No respiratory distress. Abdominal: Soft.  There is no tenderness. Psychiatric: Patient has a normal mood and affect. behavior is normal. Judgment and thought content normal.   Recent Results (from the past 2160 hour(s))  HgB A1c     Status: Abnormal   Collection Time: 09/07/21  2:20 PM  Result Value Ref Range   Hgb A1c MFr Bld 7.1 (H) <5.7 % of total Hgb    Comment: For someone without known diabetes, a hemoglobin A1c value of 6.5% or greater indicates that they may have  diabetes and this should be confirmed with a follow-up  test. . For someone with known diabetes, a value <7% indicates  that their diabetes is well controlled and a value  greater than or equal to 7% indicates suboptimal  control. A1c targets should be individualized based on  duration of diabetes, age, comorbid conditions, and  other considerations. . Currently, no consensus exists regarding use of hemoglobin A1c for diagnosis of diabetes for children. .    Mean Plasma Glucose 157 mg/dL   eAG (mmol/L) 8.7 mmol/L  Microalbumin, urine     Status: None   Collection Time: 09/07/21  2:20 PM  Result Value Ref Range   Microalb, Ur 16.0 mg/dL    Comment: Reference Range Not established    RAM      Comment: . The ADA defines abnormalities in albumin excretion as  follows: Marland Kitchen Albuminuria Category         Result (mcg/mg creatinine) . Normal to Mildly increased    <30 Moderately increased          30-299  Severely increased            > OR = 300 . The ADA recommends that at least two of three specimens collected within a 3-6 month period be abnormal before considering a patient to be within a diagnostic category.   Lipid panel     Status: Abnormal   Collection Time: 09/07/21  2:20 PM  Result  Value Ref Range   Cholesterol 124 <200 mg/dL   HDL 40 (L) > OR = 50 mg/dL   Triglycerides 97 <150 mg/dL   LDL Cholesterol (Calc) 66 mg/dL (calc)    Comment: Reference range: <100 . Desirable range <100 mg/dL for primary prevention;   <70 mg/dL for patients with CHD or diabetic patients  with > or = 2 CHD risk factors. Marland Kitchen LDL-C is now calculated using the Martin-Hopkins  calculation, which is a validated novel method providing  better accuracy than the Friedewald equation in the  estimation of LDL-C.  Cresenciano Genre et al. Annamaria Helling. 8299;371(69): 2061-2068  (http://education.[facility].com/faq/FAQ164)    Total CHOL/HDL Ratio 3.1 <5.0 (calc)   Non-HDL Cholesterol (Calc) 84 <130 mg/dL (calc)    Comment: For patients with diabetes plus 1 major ASCVD risk  factor, treating to a non-HDL-C goal of <100 mg/dL  (LDL-C of <70 mg/dL) is considered a therapeutic  option.   COMPLETE METABOLIC PANEL WITH GFR     Status: Abnormal   Collection Time: 09/07/21  2:20 PM  Result Value Ref Range   Glucose, Bld 107 (H) 65 - 99 mg/dL    Comment: .            Fasting reference interval . For someone without known diabetes, a glucose value between 100 and 125 mg/dL is consistent with prediabetes and should be confirmed with a follow-up test. .    BUN 7 7 - 25 mg/dL   Creat 0.64 0.60 - 1.00 mg/dL   eGFR 94 > OR = 60 mL/min/1.62m    Comment: The eGFR is based on the CKD-EPI 2021 equation. To calculate  the new eGFR from a previous Creatinine or Cystatin C result,  go to https://www.kidney.org/professionals/ kdoqi/gfr%5Fcalculator    BUN/Creatinine Ratio NOT APPLICABLE 6 - 22 (calc)   Sodium 141 135 - 146 mmol/L   Potassium 3.8 3.5 - 5.3 mmol/L   Chloride 102 98 - 110 mmol/L   CO2 28 20 - 32 mmol/L   Calcium 9.9 8.6 - 10.4 mg/dL   Total Protein 7.3 6.1 - 8.1 g/dL   Albumin 4.8 3.6 - 5.1 g/dL   Globulin 2.5 1.9 - 3.7 g/dL (calc)   AG Ratio 1.9 1.0 - 2.5 (calc)   Total Bilirubin 0.4 0.2 - 1.2 mg/dL   Alkaline phosphatase (APISO) 68 37 - 153 U/L   AST 21 10 - 35 U/L   ALT 22 6 - 29 U/L  CBC with Differential/Platelet     Status: None   Collection Time: 09/07/21  2:20 PM  Result Value Ref Range   WBC 7.7 3.8 - 10.8 Thousand/uL   RBC 4.71 3.80 - 5.10 Million/uL   Hemoglobin 13.3 11.7 - 15.5 g/dL   HCT 40.5 35.0 - 45.0 %   MCV 86.0 80.0 - 100.0 fL   MCH 28.2 27.0 - 33.0 pg   MCHC 32.8 32.0 - 36.0 g/dL   RDW 11.8 11.0 - 15.0 %   Platelets 285 140 - 400 Thousand/uL   MPV 10.0 7.5 - 12.5 fL   Neutro Abs 4,843 1,500 - 7,800 cells/uL   Lymphs Abs 2,202 850 - 3,900 cells/uL   Absolute Monocytes 539 200 - 950 cells/uL   Eosinophils Absolute 62 15 - 500 cells/uL   Basophils Absolute 54 0 - 200 cells/uL   Neutrophils Relative % 62.9 %   Total Lymphocyte 28.6 %   Monocytes Relative 7.0 %   Eosinophils Relative 0.8 %   Basophils Relative 0.7 %  POCT Urinalysis Dipstick     Status: Abnormal   Collection Time: 10/29/21  2:20 PM  Result Value Ref Range   Color, UA Yellow    Clarity, UA Clear    Glucose, UA Negative Negative   Bilirubin, UA Negative    Ketones, UA Negative    Spec Grav, UA 1.025 1.010 - 1.025   Blood, UA Negative    pH, UA 6.0 5.0 - 8.0   Protein, UA Positive (A) Negative    Comment: Trace   Urobilinogen, UA 0.2 0.2 or 1.0 E.U./dL   Nitrite, UA Negative    Leukocytes, UA Trace (A) Negative   Appearance Clear    Odor Strong     PHQ2/9:    10/29/2021    2:08 PM 09/07/2021    1:21 PM 05/09/2021    1:26 PM 11/08/2020     9:32 AM 11/07/2020    3:48 PM  Depression screen PHQ 2/9  Decreased Interest 0 0 0 0 0  Down, Depressed, Hopeless 0 0 0 0 0  PHQ - 2 Score 0 0 0 0 0  Altered sleeping 3  0    Tired, decreased energy 0  0    Change in appetite 1  0    Feeling bad or failure about yourself  0  0    Trouble concentrating 0  0    Moving slowly or fidgety/restless 0  0    Suicidal thoughts 0  0    PHQ-9 Score 4  0      phq 9 is negative   Fall Risk:    10/29/2021    2:07 PM 09/07/2021    1:21 PM 05/09/2021    1:26 PM 11/08/2020    9:32 AM 11/07/2020    3:50 PM  Fall Risk   Falls in the past year? 0 0 0 0 0  Number falls in past yr: 0  0 0 0  Injury with Fall? 0  0 0 0  Risk for fall due to : No Fall Risks  No Fall Risks  No Fall Risks  Follow up Falls prevention discussed Falls prevention discussed Falls prevention discussed  Falls prevention discussed      Functional Status Survey: Is the patient deaf or have difficulty hearing?: No Does the patient have difficulty seeing, even when wearing glasses/contacts?: No Does the patient have difficulty concentrating, remembering, or making decisions?: No Does the patient have difficulty walking or climbing stairs?: No Does the patient have difficulty dressing or bathing?: No Does the patient have difficulty doing errands alone such as visiting a doctor's office or shopping?: No    Assessment & Plan  Problem List Items Addressed This Visit     Marital problem   Dysthymia   Relevant Medications   citalopram (CELEXA) 10 MG tablet   Other Visit Diagnoses     Dysuria    -  Primary   Relevant Orders   POCT Urinalysis Dipstick (Completed)   CULTURE, URINE COMPREHENSIVE       She will contact insurance to find out about therapy

## 2021-10-26 NOTE — Progress Notes (Deleted)
Name: Dawn Werner   MRN: [id number]    DOB: 1951/02/27   Date:10/26/2021       Progress Note  Subjective  Chief Complaint  Follow up   HPI  DMII:  Her  hgbA1C went down from 7.8%  to 7.3%, up to 7.4%,  7.3% ,6.9%, 6.8%, 7.5% 6.8% down to 6.5% , 6.4 % 7.1%,down to 6.9 % ,7 % last visit it was 6.8 % . She has improved her diet.  She  has history of nephropathy and is on ARB we will recheck urine micro today.She denies polyphagia, polydipsia or polyuria. She is taking Metformin ER to 750 mg ER and denies side effects of medications Compliant with statin therapy.    OA both knees and left hip: she states symptoms improved, seen by Ortho in the past , she is active , she has a rose garden   Hyperlipidemia: she is back on Crestor 40 mg and Zetia 10 mg daily Denies myalgias Last LDL at goal at 64 , she is due for repeat labs today    HTN:  No chest pain, palpitation or SOB , taking medication as prescribed. Explained bp has been towards low end of normal, decrease dose of Losartan from 50 mg to 25 mg last visit and we will decrease carvedilol today from 6.25 to 3.125   CAD: no episodes, s/p history of MI in 2009, taking aspirin daily,ARB,  beta-blocker and statin therapy. No chest pain or  decrease in exercise tolerance. No longer seeing a cardiologist.    Asthma Mild Intermittent: she has been doing well, no cough, wheezing or SOB lately, she has to use Breo daily during the month of March due to high pollen count , she still has rescue inhaler at home and does not need a refill  Atherosclerosis of aorta: on statin, aspirin  81 mg daily  last LDL at goal Continue medication   GERD: doing well on Omeprazole . No heartburn or indigestion   Insomnia: she had weaned self off Ambien but has been taking otc sleep aids and still only able to fall asleep around 3 am and has interrupted sleep. She had some old Ambien pills and has been taking a third of a tablet and it has helped significantly, she  would like to resume taking it, she will try prn only. She is aware of side effects.   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   Primary osteoarthritis of knee 01/24/2015   Asthma, mild intermittent 11/18/2014   Arteriosclerosis of coronary artery 11/18/2014   Insomnia, persistent 11/18/2014   Dyslipidemia 11/18/2014   Acid reflux 11/18/2014   Diabetes type 2, controlled ([place]) 11/18/2014   Genital herpes in women 11/18/2014   Hypertension 11/18/2014   Asymptomatic postmenopausal status 11/18/2014   History of acute myocardial infarction 11/18/2014    Past Surgical History:  Procedure Laterality Date   ABDOMINAL HYSTERECTOMY     BREAST SURGERY     breast reduction   REDUCTION MAMMAPLASTY Bilateral 1995   TUBAL LIGATION      Family History  Problem Relation Age of Onset   Hyperlipidemia Mother    Hypertension Mother    Stroke Mother    Dementia Father    Cancer Sister  breast   Breast cancer Sister 77   Asthma Brother    Depression Brother        bipolar   Heart disease Brother    Heart disease Brother     Social History   Tobacco Use   Smoking status: Never   Smokeless tobacco: Never  Substance Use Topics   Alcohol use: No    Alcohol/week: 0.0 standard drinks     Current Outpatient Medications:    ACCU-CHEK GUIDE test strip, USE TO TEST BLOOD SUGAR UP TO 4 TIMES A DAY AS DIRECTED, Disp: 100 strip, Rfl: 11   Accu-Chek Softclix Lancets lancets, USE TO TEST BLOOD SUGAR UP TO 4 TIMES A DAY AS DIRECTED, Disp: 100 each, Rfl: 0   albuterol (VENTOLIN HFA) 108 (90 Base) MCG/ACT inhaler, Inhale 2 puffs into the lungs as needed., Disp: , Rfl:    aspirin 81 MG EC tablet, Take 1 tablet (81 mg total) by mouth daily. Swallow  whole., Disp: 30 tablet, Rfl: 12   Blood Glucose Monitoring Suppl (ACCU-CHEK GUIDE ME) w/Device KIT, USE AS DIRECTED, Disp: 1 kit, Rfl: 0   carvedilol (COREG) 3.125 MG tablet, Take 1 tablet (3.125 mg total) by mouth 2 (two) times daily., Disp: 180 tablet, Rfl: 1   ezetimibe (ZETIA) 10 MG tablet, Take 1 tablet (10 mg total) by mouth daily., Disp: 90 tablet, Rfl: 1   losartan (COZAAR) 25 MG tablet, Take 1 tablet (25 mg total) by mouth daily., Disp: 90 tablet, Rfl: 1   metFORMIN (GLUCOPHAGE-XR) 750 MG 24 hr tablet, TAKE 1 TABLET BY MOUTH EVERY DAY WITH BREAKFAST, Disp: 90 tablet, Rfl: 1   omeprazole (PRILOSEC) 40 MG capsule, Take 1 capsule (40 mg total) by mouth daily., Disp: 90 capsule, Rfl: 1   rosuvastatin (CRESTOR) 40 MG tablet, Take 1 tablet (40 mg total) by mouth daily., Disp: 90 tablet, Rfl: 1   zolpidem (AMBIEN) 10 MG tablet, Take 0.5-1 tablets (5-10 mg total) by mouth at bedtime as needed for sleep., Disp: 90 tablet, Rfl: 1  Allergies  Allergen Reactions   Metformin And Related Diarrhea    And nausea. Tolerates XR formulation    I personally reviewed {Reviewed:14835} with the patient/caregiver today.   ROS  ***  Objective  There were no vitals filed for this visit.  There is no height or weight on file to calculate BMI.  Physical Exam ***  Recent Results (from the past 2160 hour(s))  HgB A1c     Status: Abnormal   Collection Time: 09/07/21  2:20 PM  Result Value Ref Range   Hgb A1c MFr Bld 7.1 (H) <5.7 % of total Hgb    Comment: For someone without known diabetes, a hemoglobin A1c value of 6.5% or greater indicates that they may have  diabetes and this should be confirmed with a follow-up  test. . For someone with known diabetes, a value <7% indicates  that their diabetes is well controlled and a value  greater than or equal to 7% indicates suboptimal  control. A1c targets should be individualized based on  duration of diabetes, age, comorbid conditions, and   other considerations. . Currently, no consensus exists regarding use of hemoglobin A1c for diagnosis of diabetes for children. .    Mean Plasma Glucose 157 mg/dL   eAG (mmol/L) 8.7 mmol/L  Microalbumin, urine     Status: None   Collection Time: 09/07/21  2:20 PM  Result Value Ref Range   Microalb, Ur 16.0 mg/dL  Comment: Reference Range Not established    RAM      Comment: . The ADA defines abnormalities in albumin excretion as follows: Marland Kitchen Albuminuria Category         Result (mcg/mg creatinine) . Normal to Mildly increased    <30 Moderately increased          30-299  Severely increased            > OR = 300 . The ADA recommends that at least two of three specimens collected within a 3-6 month period be abnormal before considering a patient to be within a diagnostic category.   Lipid panel     Status: Abnormal   Collection Time: 09/07/21  2:20 PM  Result Value Ref Range   Cholesterol 124 <200 mg/dL   HDL 40 (L) > OR = 50 mg/dL   Triglycerides 97 <150 mg/dL   LDL Cholesterol (Calc) 66 mg/dL (calc)    Comment: Reference range: <100 . Desirable range <100 mg/dL for primary prevention;   <70 mg/dL for patients with CHD or diabetic patients  with > or = 2 CHD risk factors. Marland Kitchen LDL-C is now calculated using the Martin-Hopkins  calculation, which is a validated novel method providing  better accuracy than the Friedewald equation in the  estimation of LDL-C.  Cresenciano Genre et al. Annamaria Helling. 8466;599(35): 2061-2068  (http://education.[facility].com/faq/FAQ164)    Total CHOL/HDL Ratio 3.1 <5.0 (calc)   Non-HDL Cholesterol (Calc) 84 <130 mg/dL (calc)    Comment: For patients with diabetes plus 1 major ASCVD risk  factor, treating to a non-HDL-C goal of <100 mg/dL  (LDL-C of <70 mg/dL) is considered a therapeutic  option.   COMPLETE METABOLIC PANEL WITH GFR     Status: Abnormal   Collection Time: 09/07/21  2:20 PM  Result Value Ref Range   Glucose, Bld 107 (H) 65 - 99  mg/dL    Comment: .            Fasting reference interval . For someone without known diabetes, a glucose value between 100 and 125 mg/dL is consistent with prediabetes and should be confirmed with a follow-up test. .    BUN 7 7 - 25 mg/dL   Creat 0.64 0.60 - 1.00 mg/dL   eGFR 94 > OR = 60 mL/min/1.73m    Comment: The eGFR is based on the CKD-EPI 2021 equation. To calculate  the new eGFR from a previous Creatinine or Cystatin C result, go to https://www.kidney.org/professionals/ kdoqi/gfr%5Fcalculator    BUN/Creatinine Ratio NOT APPLICABLE 6 - 22 (calc)   Sodium 141 135 - 146 mmol/L   Potassium 3.8 3.5 - 5.3 mmol/L   Chloride 102 98 - 110 mmol/L   CO2 28 20 - 32 mmol/L   Calcium 9.9 8.6 - 10.4 mg/dL   Total Protein 7.3 6.1 - 8.1 g/dL   Albumin 4.8 3.6 - 5.1 g/dL   Globulin 2.5 1.9 - 3.7 g/dL (calc)   AG Ratio 1.9 1.0 - 2.5 (calc)   Total Bilirubin 0.4 0.2 - 1.2 mg/dL   Alkaline phosphatase (APISO) 68 37 - 153 U/L   AST 21 10 - 35 U/L   ALT 22 6 - 29 U/L  CBC with Differential/Platelet     Status: None   Collection Time: 09/07/21  2:20 PM  Result Value Ref Range   WBC 7.7 3.8 - 10.8 Thousand/uL   RBC 4.71 3.80 - 5.10 Million/uL   Hemoglobin 13.3 11.7 - 15.5 g/dL   HCT 40.5 35.0 -  45.0 %   MCV 86.0 80.0 - 100.0 fL   MCH 28.2 27.0 - 33.0 pg   MCHC 32.8 32.0 - 36.0 g/dL   RDW 11.8 11.0 - 15.0 %   Platelets 285 140 - 400 Thousand/uL   MPV 10.0 7.5 - 12.5 fL   Neutro Abs 4,843 1,500 - 7,800 cells/uL   Lymphs Abs 2,202 850 - 3,900 cells/uL   Absolute Monocytes 539 200 - 950 cells/uL   Eosinophils Absolute 62 15 - 500 cells/uL   Basophils Absolute 54 0 - 200 cells/uL   Neutrophils Relative % 62.9 %   Total Lymphocyte 28.6 %   Monocytes Relative 7.0 %   Eosinophils Relative 0.8 %   Basophils Relative 0.7 %    Diabetic Foot Exam: Diabetic Foot Exam - Simple   No data filed    ***  PHQ2/9:    09/07/2021    1:21 PM 05/09/2021    1:26 PM 11/08/2020    9:32 AM  11/07/2020    3:48 PM 07/24/2020   10:15 AM  Depression screen PHQ 2/9  Decreased Interest 0 0 0 0 0  Down, Depressed, Hopeless 0 0 0 0 0  PHQ - 2 Score 0 0 0 0 0  Altered sleeping  0     Tired, decreased energy  0     Change in appetite  0     Feeling bad or failure about yourself   0     Trouble concentrating  0     Moving slowly or fidgety/restless  0     Suicidal thoughts  0     PHQ-9 Score  0       phq 9 is {gen pos [id number]} ***  Fall Risk:    09/07/2021    1:21 PM 05/09/2021    1:26 PM 11/08/2020    9:32 AM 11/07/2020    3:50 PM 07/24/2020   10:15 AM  Fall Risk   Falls in the past year? 0 0 0 0 0  Number falls in past yr:  0 0 0 0  Injury with Fall?  0 0 0 0  Risk for fall due to :  No Fall Risks  No Fall Risks   Follow up Falls prevention discussed Falls prevention discussed  Falls prevention discussed    ***   Functional Status Survey:   ***   Assessment & Plan  *** There are no diagnoses linked to this encounter.

## 2021-10-29 ENCOUNTER — Ambulatory Visit (INDEPENDENT_AMBULATORY_CARE_PROVIDER_SITE_OTHER): Payer: Medicare Other | Admitting: Family Medicine

## 2021-10-29 ENCOUNTER — Encounter: Payer: Self-pay | Admitting: Family Medicine

## 2021-10-29 VITALS — BP 116/64 | HR 87 | Resp 16 | Ht 63.0 in | Wt 154.0 lb

## 2021-10-29 DIAGNOSIS — Z630 Problems in relationship with spouse or partner: Secondary | ICD-10-CM | POA: Diagnosis not present

## 2021-10-29 DIAGNOSIS — R300 Dysuria: Secondary | ICD-10-CM | POA: Diagnosis not present

## 2021-10-29 DIAGNOSIS — E1129 Type 2 diabetes mellitus with other diabetic kidney complication: Secondary | ICD-10-CM

## 2021-10-29 DIAGNOSIS — F330 Major depressive disorder, recurrent, mild: Secondary | ICD-10-CM | POA: Diagnosis not present

## 2021-10-29 DIAGNOSIS — F341 Dysthymic disorder: Secondary | ICD-10-CM | POA: Insufficient documentation

## 2021-10-29 LAB — POCT URINALYSIS DIPSTICK
Bilirubin, UA: NEGATIVE
Blood, UA: NEGATIVE
Glucose, UA: NEGATIVE
Ketones, UA: NEGATIVE
Nitrite, UA: NEGATIVE
Protein, UA: POSITIVE — AB
Spec Grav, UA: 1.025 (ref 1.010–1.025)
Urobilinogen, UA: 0.2 E.U./dL
pH, UA: 6 (ref 5.0–8.0)

## 2021-10-29 MED ORDER — CITALOPRAM HYDROBROMIDE 10 MG PO TABS: 10.0000 mg | ORAL_TABLET | Freq: Every day | ORAL | 1 refills | Status: DC

## 2021-10-29 NOTE — Patient Instructions (Signed)
Contact your insurance to find out what counselor in town is in your [place] at [place]. Waylan Boga office is good - black lady

## 2021-10-31 ENCOUNTER — Telehealth: Payer: Self-pay

## 2021-10-31 LAB — CULTURE, URINE COMPREHENSIVE
MICRO NUMBER:: 13428930
SPECIMEN QUALITY:: ADEQUATE

## 2021-10-31 NOTE — Telephone Encounter (Unsigned)
Copied from CRM [id number]. Topic: General - Other >> Oct 31, 2021 10:39 AM Pawlus, Maxine Glenn A wrote: Reason for CRM: Pt called in to go over latest lab results, please advise.

## 2021-11-01 ENCOUNTER — Other Ambulatory Visit: Payer: Self-pay | Admitting: Family Medicine

## 2021-11-01 DIAGNOSIS — N3000 Acute cystitis without hematuria: Secondary | ICD-10-CM

## 2021-11-01 MED ORDER — CIPROFLOXACIN HCL 250 MG PO TABS: 250.0000 mg | ORAL_TABLET | Freq: Two times a day (BID) | ORAL | 0 refills | Status: AC

## 2021-11-08 ENCOUNTER — Ambulatory Visit: Payer: Medicare Other

## 2021-11-20 ENCOUNTER — Ambulatory Visit (INDEPENDENT_AMBULATORY_CARE_PROVIDER_SITE_OTHER): Payer: Medicare Other

## 2021-11-20 VITALS — BP 130/78 | HR 59 | Temp 98.2°F | Resp 16 | Ht 63.0 in | Wt 152.0 lb

## 2021-11-20 DIAGNOSIS — Z789 Other specified health status: Secondary | ICD-10-CM

## 2021-11-20 DIAGNOSIS — Z0000 Encounter for general adult medical examination without abnormal findings: Secondary | ICD-10-CM

## 2021-11-20 NOTE — Progress Notes (Signed)
Subjective:   Dawn Werner is a 71 y.o. female who presents for Medicare Annual (Subsequent) preventive examination.  Review of Systems     Cardiac Risk Factors include: advanced age (>62mn, >>36women);diabetes mellitus;dyslipidemia;hypertension     Objective:    Today's Vitals   11/20/21 1111  BP: 130/78  Pulse: (!) 59  Resp: 16  Temp: 98.2 F (36.8 C)  TempSrc: Oral  SpO2: 99%  Weight: 152 lb (68.9 kg)  Height: _0  (1.6 m)   Body mass index is 26.93 kg/m.     11/20/2021   11:33 AM 03/02/2021   11:19 AM 11/07/2020    3:49 PM 10/19/2019    8:50 AM 04/24/2019   11:03 AM 10/15/2018    9:00 AM 10/10/2017    8:43 AM  Advanced Directives  Does Patient Have a Medical Advance Directive? _1  No No  Would patient like information on creating a medical advance directive? No - Patient declined  No - Patient declined No - Patient declined  Yes (MAU/Ambulatory/Procedural Areas - Information given) Yes (MAU/Ambulatory/Procedural Areas - Information given)    Current Medications (verified) Outpatient Encounter Medications as of 11/20/2021  Medication Sig   albuterol (VENTOLIN HFA) 108 (90 Base) MCG/ACT inhaler Inhale 2 puffs into the lungs as needed.   carvedilol (COREG) 3.125 MG tablet Take 1 tablet (3.125 mg total) by mouth 2 (two) times daily.   citalopram (CELEXA) 10 MG tablet Take 1 tablet (10 mg total) by mouth daily.   ezetimibe (ZETIA) 10 MG tablet Take 1 tablet (10 mg total) by mouth daily.   losartan (COZAAR) 25 MG tablet Take 1 tablet (25 mg total) by mouth daily.   metFORMIN (GLUCOPHAGE-XR) 750 MG 24 hr tablet TAKE 1 TABLET BY MOUTH EVERY DAY WITH BREAKFAST   omeprazole (PRILOSEC) 40 MG capsule Take 1 capsule (40 mg total) by mouth daily.   rosuvastatin (CRESTOR) 40 MG tablet Take 1 tablet (40 mg total) by mouth daily.   ACCU-CHEK GUIDE test strip USE TO TEST BLOOD SUGAR UP TO 4 TIMES A DAY AS DIRECTED (Patient not taking: Reported on 11/20/2021)    Accu-Chek Softclix Lancets lancets USE TO TEST BLOOD SUGAR UP TO 4 TIMES A DAY AS DIRECTED (Patient not taking: Reported on 11/20/2021)   Blood Glucose Monitoring Suppl (ACCU-CHEK GUIDE ME) w/Device KIT USE AS DIRECTED   zolpidem (AMBIEN) 10 MG tablet Take 0.5-1 tablets (5-10 mg total) by mouth at bedtime as needed for sleep.   [DISCONTINUED] aspirin 81 MG EC tablet Take 1 tablet (81 mg total) by mouth daily. Swallow whole.   No facility-administered encounter medications on file as of 11/20/2021.    Allergies (verified) Metformin and related   History: Past Medical History:  Diagnosis Date   Anxiety    Asthma    Diabetes mellitus without complication (HCC)    GERD (gastroesophageal reflux disease)    Hyperlipidemia    Insomnia    Past Surgical History:  Procedure Laterality Date   ABDOMINAL HYSTERECTOMY     BREAST SURGERY     breast reduction   REDUCTION MAMMAPLASTY Bilateral 1995   TUBAL LIGATION     Family History  Problem Relation Age of Onset   Hyperlipidemia Mother    Hypertension Mother    Stroke Mother    Dementia Father    Cancer Sister        breast   Breast cancer Sister 453  Asthma Brother    Depression Brother  bipolar   Heart disease Brother    Heart disease Brother    Social History   Socioeconomic History   Marital status: Married    Spouse name: [name]   Number of children: 2   Years of education: Not on file   Highest education level: 12th grade  Occupational History   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  Tobacco Use   Smoking status: Never   Smokeless tobacco: Never  Vaping Use   Vaping Use: Never used  Substance and Sexual Activity   Alcohol use: No    Alcohol/week: 0.0 standard drinks of alcohol   Drug use: No   Sexual activity: Yes    Partners: Male  Other Topics Concern   Not on file  Social History Narrative   Not on file   Social Determinants of Health   Financial Resource Strain: Low Risk  (11/20/2021)    Overall Financial Resource Strain (CARDIA)    Difficulty of Paying Living Expenses: Not hard at all  Food Insecurity: No Food Insecurity (11/20/2021)   Hunger Vital Sign    Worried About Running Out of Food in the Last Year: Never true    [place] in the Last Year: Never true  Transportation Needs: No Transportation Needs (11/20/2021)   PRAPARE - Hydrologist (Medical): No    Lack of Transportation (Non-Medical): No  Physical Activity: Sufficiently Active (11/20/2021)   Exercise Vital Sign    Days of Exercise per Week: 7 days    Minutes of Exercise per Session: 30 min  Stress: Stress Concern Present (11/20/2021)   [place]    Feeling of Stress : Rather much  Social Connections: Socially Integrated (11/20/2021)   Social Connection and Isolation Panel [NHANES]    Frequency of Communication with Friends and Family: More than three times a week    Frequency of Social Gatherings with Friends and Family: More than three times a week    Attends Religious Services: More than 4 times per year    Active Member of [employer] or Organizations: Yes    Attends Music therapist: More than 4 times per year    Marital Status: Married    Tobacco Counseling Counseling given: Not Answered   Clinical Intake:  Pre-visit preparation completed: Yes  Pain : No/denies pain     BMI - recorded: 26.93 Nutritional Status: BMI 25 -29 Overweight Nutritional Risks: None Diabetes: Yes CBG done?: No Did pt. bring in CBG monitor from home?: No  How often do you need to have someone help you when you read instructions, pamphlets, or other written materials from your doctor or pharmacy?: 1 - Never  Nutrition Risk Assessment:  Has the patient had any N/V/D within the last 2 months?  No  Does the patient have any non-healing wounds?  No  Has the patient had any unintentional weight loss or weight  gain?  No   Diabetes:  Is the patient diabetic?  Yes  If diabetic, was a CBG obtained today?  No  Did the patient bring in their glucometer from home?  No  How often do you monitor your CBG's? Pt does not actively check blood sugar, pt states meter was broken, new accu chek guide sample meter provided today.   Financial Strains and Diabetes Management:  Are you having any financial strains with the device, your supplies or your medication? No .  Does  the patient want to be seen by Chronic Care Management for management of their diabetes?  No  Would the patient like to be referred to a Nutritionist or for Diabetic Management?  No   Diabetic Exams:  Diabetic Eye Exam: Completed 10/16/21.   Diabetic Foot Exam: Completed 05/09/21.   Interpreter Needed?: No  Information entered by :: Clemetine Marker LPN   Activities of Daily Living    11/20/2021   11:34 AM 10/29/2021    2:08 PM  In your present state of health, do you have any difficulty performing the following activities:  Hearing? 0 0  Vision? 0 0  Difficulty concentrating or making decisions? 0 0  Walking or climbing stairs? 0 0  Dressing or bathing? 0 0  Doing errands, shopping? 0 0  Preparing Food and eating ? N   Using the Toilet? N   In the past six months, have you accidently leaked urine? N   Do you have problems with loss of bowel control? N   Managing your Medications? N   Managing your Finances? N   Housekeeping or managing your Housekeeping? N     Patient Care Team: Steele Sizer, MD as PCP - General (Family Medicine)  Indicate any recent Medical Services you may have received from other than [facility] providers in the past year (date may be approximate).     Assessment:   This is a routine wellness examination for Dawn.  Hearing/Vision screen Hearing Screening - Comments:: Pt denies hearing difficulty Vision Screening - Comments:: Annual vision screenings at [place] issues and  exercise activities discussed: Current Exercise Habits: Home exercise routine, Type of exercise: Other - see comments (yard work), Time (Minutes): 30, Frequency (Times/Week): 7, Weekly Exercise (Minutes/Week): 210, Intensity: Mild, Exercise limited by: None identified   Goals Addressed   None    Depression Screen    11/20/2021   11:14 AM 10/29/2021    2:08 PM 09/07/2021    1:21 PM 05/09/2021    1:26 PM 11/08/2020    9:32 AM 11/07/2020    3:48 PM 07/24/2020   10:15 AM  PHQ 2/9 Scores  PHQ - 2 Score 0 0 0 0 0 0 0  PHQ- 9 Score 4 4  0       Fall Risk    11/20/2021   11:34 AM 10/29/2021    2:07 PM 09/07/2021    1:21 PM 05/09/2021    1:26 PM 11/08/2020    9:32 AM  Fall Risk   Falls in the past year? 0 0 0 0 0  Number falls in past yr: 0 0  0 0  Injury with Fall? 0 0  0 0  Risk for fall due to : No Fall Risks No Fall Risks  No Fall Risks   Follow up Falls prevention discussed Falls prevention discussed Falls prevention discussed Falls prevention discussed     FALL RISK PREVENTION PERTAINING TO THE HOME:  Any stairs in or around the home? Yes  If so, are there any without handrails? No  Home free of loose throw rugs in walkways, pet beds, electrical cords, etc? Yes  Adequate lighting in your home to reduce risk of falls? Yes   ASSISTIVE DEVICES UTILIZED TO PREVENT FALLS:  Life alert? No  Use of a cane, walker or w/c? No  Grab bars in the bathroom? Yes  Shower chair or bench in shower? Yes  Elevated toilet seat or a handicapped toilet? Yes   TIMED  UP AND GO:  Was the test performed? Yes .  Length of time to ambulate 10 feet: 5 sec.   Gait steady and fast without use of assistive device  Cognitive Function:        10/19/2019    8:54 AM 10/15/2018    9:08 AM 10/10/2017    8:33 AM 06/24/2016   11:19 AM  6CIT Screen  What Year? 0 points 0 points 0 points 0 points  What month? 0 points 0 points 0 points 0 points  What time? 0 points 0 points 0 points 0 points  Count back from  20 0 points 0 points 0 points 0 points  Months in reverse 0 points 0 points 0 points 0 points  Repeat phrase 4 points 4 points 0 points 2 points  Total Score 4 points 4 points 0 points 2 points    Immunizations Immunization History  Administered Date(s) Administered   Fluad Quad(high Dose 65+) 02/16/2019, 03/20/2020   Influenza, High Dose Seasonal PF 02/20/2015, 05/24/2016, 01/29/2017, 02/10/2018   Moderna Sars-Covid-2 Vaccination 07/05/2019, 08/02/2019, 04/01/2020, 10/10/2020   Pfizer Covid-19 Vaccine Bivalent Booster 44yr & up 05/24/2021   Pneumococcal Conjugate-13 01/10/2016   Pneumococcal Polysaccharide-23 11/18/2014   Td 01/29/2017   Tdap 04/10/2007   Tetanus 04/10/2007    TDAP status: Up to date  Flu vaccine status: due fall 2023  Pneumococcal vaccine status: Up to date  Covid-19 vaccine status: Completed vaccines  Qualifies for Shingles Vaccine? Yes   Zostavax completed No   Shingrix Completed?: No.    Education has been provided regarding the importance of this vaccine. Patient has been advised to call insurance company to determine out of pocket expense if they have not yet received this vaccine. Advised may also receive vaccine at local pharmacy or Health Dept. Verbalized acceptance and understanding.  Screening Tests Health Maintenance  Topic Date Due   COVID-19 Vaccine (6 - Moderna series) 09/22/2021   Zoster Vaccines- Shingrix (1 of 2) 12/07/2021 (Originally 09/29/1999)   INFLUENZA VACCINE  01/08/2022   HEMOGLOBIN A1C  03/09/2022   MAMMOGRAM  03/29/2022   FOOT EXAM  05/09/2022   OPHTHALMOLOGY EXAM  10/17/2022   COLONOSCOPY (Pts 45-47yrInsurance coverage will need to be confirmed)  06/19/2023   TETANUS/TDAP  01/30/2027   Pneumonia Vaccine 6542Years old  Completed   DEXA SCAN  Completed   Hepatitis C Screening  Completed   HPV VACCINES  Aged Out    Health Maintenance  Health Maintenance Due  Topic Date Due   COVID-19 Vaccine (6 - Moderna series)  09/22/2021    Colorectal cancer screening: Type of screening: Colonoscopy. Completed 06/18/13. Repeat every 10 years  Mammogram status: Completed 03/29/21. Repeat every year  Bone Density status: Completed 03/11/07. Results reflect: Bone density results: OSTEOPENIA. Repeat every 2 years.  Lung Cancer Screening: (Low Dose CT Chest recommended if Age 72-80ears, 30 pack-year currently smoking OR have quit w/in 15years.) does not qualify.   Additional Screening:  Hepatitis C Screening: does qualify; Completed 06/26/12  Vision Screening: Recommended annual ophthalmology exams for early detection of glaucoma and other disorders of the eye. Is the patient up to date with their annual eye exam?  Yes  Who is the provider or what is the name of the office in which the patient attends annual eye exams? [facility]  Dental Screening: Recommended annual dental exams for proper oral hygiene  Community Resource Referral / Chronic Care Management: CRR required this visit?  yes -  counseling resources for personal and relationship therapy see referral  CCM required this visit?  No      Plan:     I have personally reviewed and noted the following in the patient's chart:   Medical and social history Use of alcohol, tobacco or illicit drugs  Current medications and supplements including opioid prescriptions.  Functional ability and status Nutritional status Physical activity Advanced directives List of other physicians Hospitalizations, surgeries, and ER visits in previous 12 months Vitals Screenings to include cognitive, depression, and falls Referrals and appointments  In addition, I have reviewed and discussed with patient certain preventive protocols, quality metrics, and best practice recommendations. A written personalized care plan for preventive services as well as general preventive health recommendations were provided to patient.     Clemetine Marker, LPN   1/72/0910    Nurse Notes: none

## 2021-11-20 NOTE — Patient Instructions (Signed)
[name] , Thank you for taking time to come for your Medicare Wellness Visit. I appreciate your ongoing commitment to your health goals. Please review the following plan we discussed and let me know if I can assist you in the future.   Screening recommendations/referrals: Colonoscopy: done 06/18/2013. Repeat 06/2023 Mammogram: done 03/29/21 Bone Density: done 03/11/07 Recommended yearly ophthalmology/optometry visit for glaucoma screening and checkup Recommended yearly dental visit for hygiene and checkup  Vaccinations: Influenza vaccine: due fall 2023 Pneumococcal vaccine: done 01/10/16 Tdap vaccine: done 01/29/17 Shingles vaccine: Shingrix discussed. Please contact your pharmacy for coverage information.  Covid-19: done 07/05/19, 08/02/19, 04/01/20, 10/10/20 & 05/24/21  Advanced directives: Please bring a copy of your health care power of attorney and living will to the office at your convenience once you have completed those documents.   Conditions/risks identified: Keep up the great work!  Next appointment: Follow up in one year for your annual wellness visit    Preventive Care 65 Years and Older, Female Preventive care refers to lifestyle choices and visits with your health care provider that can promote health and wellness. What does preventive care include? A yearly physical exam. This is also called an annual well check. Dental exams once or twice a year. Routine eye exams. Ask your health care provider how often you should have your eyes checked. Personal lifestyle choices, including: Daily care of your teeth and gums. Regular physical activity. Eating a healthy diet. Avoiding tobacco and drug use. Limiting alcohol use. Practicing safe sex. Taking low-dose aspirin every day. Taking vitamin and mineral supplements as recommended by your health care provider. What happens during an annual well check? The services and screenings done by your health care provider during your  annual well check will depend on your age, overall health, lifestyle risk factors, and family history of disease. Counseling  Your health care provider may ask you questions about your: Alcohol use. Tobacco use. Drug use. Emotional well-being. Home and relationship well-being. Sexual activity. Eating habits. History of falls. Memory and ability to understand (cognition). Work and work Astronomer. Reproductive health. Screening  You may have the following tests or measurements: Height, weight, and BMI. Blood pressure. Lipid and cholesterol levels. These may be checked every 5 years, or more frequently if you are over 56 years old. Skin check. Lung cancer screening. You may have this screening every year starting at age 54 if you have a 30-pack-year history of smoking and currently smoke or have quit within the past 15 years. Fecal occult blood test (FOBT) of the stool. You may have this test every year starting at age 72. Flexible sigmoidoscopy or colonoscopy. You may have a sigmoidoscopy every 5 years or a colonoscopy every 10 years starting at age 29. Hepatitis C blood test. Hepatitis B blood test. Sexually transmitted disease (STD) testing. Diabetes screening. This is done by checking your blood sugar (glucose) after you have not eaten for a while (fasting). You may have this done every 1-3 years. Bone density scan. This is done to screen for osteoporosis. You may have this done starting at age 76. Mammogram. This may be done every 1-2 years. Talk to your health care provider about how often you should have regular mammograms. Talk with your health care provider about your test results, treatment options, and if necessary, the need for more tests. Vaccines  Your health care provider may recommend certain vaccines, such as: Influenza vaccine. This is recommended every year. Tetanus, diphtheria, and acellular pertussis (Tdap, Td) vaccine.  You may need a Td booster every 10  years. Zoster vaccine. You may need this after age 64. Pneumococcal 13-valent conjugate (PCV13) vaccine. One dose is recommended after age 79. Pneumococcal polysaccharide (PPSV23) vaccine. One dose is recommended after age 30. Talk to your health care provider about which screenings and vaccines you need and how often you need them. This information is not intended to replace advice given to you by your health care provider. Make sure you discuss any questions you have with your health care provider. Document Released: 06/23/2015 Document Revised: 02/14/2016 Document Reviewed: 03/28/2015 Elsevier Interactive Patient Education  2017 [place] Prevention in the Home Falls can cause injuries. They can happen to people of all ages. There are many things you can do to make your home safe and to help prevent falls. What can I do on the outside of my home? Regularly fix the edges of walkways and driveways and fix any cracks. Remove anything that might make you trip as you walk through a door, such as a raised step or threshold. Trim any bushes or trees on the path to your home. Use bright outdoor lighting. Clear any walking paths of anything that might make someone trip, such as rocks or tools. Regularly check to see if handrails are loose or broken. Make sure that both sides of any steps have handrails. Any raised decks and porches should have guardrails on the edges. Have any leaves, snow, or ice cleared regularly. Use sand or salt on walking paths during winter. Clean up any spills in your garage right away. This includes oil or grease spills. What can I do in the bathroom? Use night lights. Install grab bars by the toilet and in the tub and shower. Do not use towel bars as grab bars. Use non-skid mats or decals in the tub or shower. If you need to sit down in the shower, use a plastic, non-slip stool. Keep the floor dry. Clean up any water that spills on the floor as soon as it  happens. Remove soap buildup in the tub or shower regularly. Attach bath mats securely with double-sided non-slip rug tape. Do not have throw rugs and other things on the floor that can make you trip. What can I do in the bedroom? Use night lights. Make sure that you have a light by your bed that is easy to reach. Do not use any sheets or blankets that are too big for your bed. They should not hang down onto the floor. Have a firm chair that has side arms. You can use this for support while you get dressed. Do not have throw rugs and other things on the floor that can make you trip. What can I do in the kitchen? Clean up any spills right away. Avoid walking on wet floors. Keep items that you use a lot in easy-to-reach places. If you need to reach something above you, use a strong step stool that has a grab bar. Keep electrical cords out of the way. Do not use floor polish or wax that makes floors slippery. If you must use wax, use non-skid floor wax. Do not have throw rugs and other things on the floor that can make you trip. What can I do with my stairs? Do not leave any items on the stairs. Make sure that there are handrails on both sides of the stairs and use them. Fix handrails that are broken or loose. Make sure that handrails are as long as  the stairways. Check any carpeting to make sure that it is firmly attached to the stairs. Fix any carpet that is loose or worn. Avoid having throw rugs at the top or bottom of the stairs. If you do have throw rugs, attach them to the floor with carpet tape. Make sure that you have a light switch at the top of the stairs and the bottom of the stairs. If you do not have them, ask someone to add them for you. What else can I do to help prevent falls? Wear shoes that: Do not have high heels. Have rubber bottoms. Are comfortable and fit you well. Are closed at the toe. Do not wear sandals. If you use a stepladder: Make sure that it is fully opened.  Do not climb a closed stepladder. Make sure that both sides of the stepladder are locked into place. Ask someone to hold it for you, if possible. Clearly mark and make sure that you can see: Any grab bars or handrails. First and last steps. Where the edge of each step is. Use tools that help you move around (mobility aids) if they are needed. These include: Canes. Walkers. Scooters. Crutches. Turn on the lights when you go into a dark area. Replace any light bulbs as soon as they burn out. Set up your furniture so you have a clear path. Avoid moving your furniture around. If any of your floors are uneven, fix them. If there are any pets around you, be aware of where they are. Review your medicines with your doctor. Some medicines can make you feel dizzy. This can increase your chance of falling. Ask your doctor what other things that you can do to help prevent falls. This information is not intended to replace advice given to you by your health care provider. Make sure you discuss any questions you have with your health care provider. Document Released: 03/23/2009 Document Revised: 11/02/2015 Document Reviewed: 07/01/2014 Elsevier Interactive Patient Education  2017 [employer].

## 2021-11-21 ENCOUNTER — Telehealth: Payer: Self-pay | Admitting: *Deleted

## 2021-11-21 NOTE — Telephone Encounter (Signed)
   Telephone encounter was:  Unsuccessful.  11/21/2021 Name: Dawn Werner MRN: [id number] DOB: Nov 27, 1950  Unsuccessful outbound call made today to assist with:   couple therapy  Outreach Attempt:  1st Attempt  A HIPAA compliant voice message was left requesting a return call.  Instructed patient to call back at   Instructed patient to call back at [phone]  at their earliest convenience. Yehuda Mao Greenauer -[facility] Guide , Embedded Care Coordination [facility], Care Management  [phone] [address] [place] , [place] [address] Email : [email or web address]

## 2021-11-22 ENCOUNTER — Telehealth: Payer: Self-pay | Admitting: *Deleted

## 2021-11-22 NOTE — Telephone Encounter (Signed)
   Telephone encounter was:  Successful.  11/22/2021 Name: Dawn Werner MRN: [id number] DOB: Aug 07, 1950  Dawn Werner is a 71 y.o. year old female who is a primary care patient of Alba Cory, MD . The community resource team was consulted for assistance with  therapy  Care guide performed the following interventions: Patient provided with information about care guide support team and interviewed to confirm resource needs.  Follow Up Plan:  Care guide will follow up with patient by phone over the next week Alois Cliche -[facility] Guide , Embedded Care Coordination [facility], Care Management  [phone] [address] [place] , [place] [address] Email : [email or web address]

## 2021-12-03 ENCOUNTER — Ambulatory Visit: Payer: Self-pay

## 2021-12-04 ENCOUNTER — Other Ambulatory Visit (HOSPITAL_COMMUNITY)
Admission: RE | Admit: 2021-12-04 | Discharge: 2021-12-04 | Disposition: A | Discharge status: Home / Self Care | Payer: Medicare Other | Source: Ambulatory Visit | Attending: Family Medicine | Admitting: Family Medicine

## 2021-12-04 ENCOUNTER — Encounter: Payer: Self-pay | Admitting: Family Medicine

## 2021-12-04 ENCOUNTER — Ambulatory Visit (INDEPENDENT_AMBULATORY_CARE_PROVIDER_SITE_OTHER): Payer: Medicare Other | Admitting: Family Medicine

## 2021-12-04 ENCOUNTER — Telehealth: Payer: Self-pay | Admitting: *Deleted

## 2021-12-04 VITALS — BP 110/64 | HR 89 | Resp 16 | Ht 63.0 in | Wt 150.0 lb

## 2021-12-04 DIAGNOSIS — M545 Low back pain, unspecified: Secondary | ICD-10-CM

## 2021-12-04 DIAGNOSIS — N898 Other specified noninflammatory disorders of vagina: Secondary | ICD-10-CM | POA: Insufficient documentation

## 2021-12-04 MED ORDER — BACLOFEN 10 MG PO TABS: 10.0000 mg | ORAL_TABLET | Freq: Three times a day (TID) | ORAL | 0 refills | Status: DC

## 2021-12-04 MED ORDER — METRONIDAZOLE 500 MG PO TABS: 500.0000 mg | ORAL_TABLET | Freq: Two times a day (BID) | ORAL | 0 refills | Status: AC

## 2021-12-05 ENCOUNTER — Telehealth: Payer: Self-pay | Admitting: *Deleted

## 2021-12-05 LAB — CERVICOVAGINAL ANCILLARY ONLY
Bacterial Vaginitis (gardnerella): POSITIVE — AB
Candida Glabrata: NEGATIVE
Candida Vaginitis: NEGATIVE
Chlamydia: NEGATIVE
Comment: NEGATIVE
Comment: NEGATIVE
Comment: NEGATIVE
Comment: NEGATIVE
Comment: NEGATIVE
Comment: NORMAL
Neisseria Gonorrhea: NEGATIVE
Trichomonas: NEGATIVE

## 2021-12-05 NOTE — Telephone Encounter (Signed)
Patient returned call for lab results and notified:  So far we know that she has BV and medication she was given should improve symptoms Urine culture is pending

## 2021-12-06 LAB — URINALYSIS, COMPLETE
Bacteria, UA: NONE SEEN /HPF
Bilirubin Urine: NEGATIVE
Glucose, UA: NEGATIVE
Hyaline Cast: NONE SEEN /LPF
Ketones, ur: NEGATIVE
Nitrite: NEGATIVE
RBC / HPF: NONE SEEN /HPF (ref 0–2)
Specific Gravity, Urine: 1.025 (ref 1.001–1.035)
pH: 5.5 (ref 5.0–8.0)

## 2021-12-06 LAB — CULTURE, URINE COMPREHENSIVE
MICRO NUMBER:: 13580611
SPECIMEN QUALITY:: ADEQUATE

## 2021-12-11 ENCOUNTER — Other Ambulatory Visit: Payer: Self-pay | Admitting: Family Medicine

## 2021-12-11 DIAGNOSIS — M545 Low back pain, unspecified: Secondary | ICD-10-CM

## 2021-12-12 NOTE — Progress Notes (Signed)
Name: Dawn Werner   MRN: [id number]    DOB: Oct 14, 1950   Date:12/13/2021       Progress Note  Subjective  Chief Complaint  Follow Up  HPI  MDD marital problems: she has been married for the past 52 years, however over the past 10 years when they became members of a larger church. He is a deacon and he likes being the center or attention. He is a Haematologist , and about 15 years ago she cheated on her. She was crying daily and lost of weight, she states on her scales the weight is stable for the past couple of weeks. She was not feeling appreciated, he did  not want to talk about it . She is waiting for therapy visit, but has been able to communicate better with her husband , she is still crying but not as often, appetite is improving. She is not going to their church to prevent herself from getting upset at him -She does not like how he acts at church - like a celebrity   Chronic low back pain with radiculitis: she is able to work in her yard but has been having a constant aching sensation on lumbar area, but has noticed a shooting/sharp pain that takes her breath away towards her left groin when walking. No weakness, no rashes She was having symptoms of urinary frequency but urine culture was negative.   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   Primary osteoarthritis of knee 01/24/2015   Asthma, mild intermittent 11/18/2014   Arteriosclerosis of coronary artery 11/18/2014   Insomnia, persistent 11/18/2014   Dyslipidemia 11/18/2014   Acid reflux 11/18/2014   Diabetes type 2, controlled (HCC)  11/18/2014   Genital herpes in women 11/18/2014   Hypertension, benign 11/18/2014   Asymptomatic postmenopausal status 11/18/2014   History of acute myocardial infarction 11/18/2014    Past Surgical History:  Procedure Laterality Date   ABDOMINAL HYSTERECTOMY     BREAST SURGERY     breast reduction   REDUCTION MAMMAPLASTY Bilateral 1995   TUBAL LIGATION      Family History  Problem Relation Age of Onset   Hyperlipidemia Mother    Hypertension Mother    Stroke Mother    Dementia Father    Cancer Sister        breast   Breast cancer Sister 71   Asthma Brother    Depression Brother        bipolar   Heart disease Brother    Heart disease Brother     Social History   Tobacco Use   Smoking status: Never   Smokeless tobacco: Never  Substance Use Topics   Alcohol use: No    Alcohol/week: 0.0 standard drinks of alcohol     Current Outpatient Medications:    albuterol (VENTOLIN HFA) 108 (90 Base) MCG/ACT inhaler, Inhale 2 puffs into the lungs as needed., Disp: , Rfl:    baclofen (LIORESAL) 10 MG tablet, Take 1 tablet (10 mg total) by mouth 3 (three) times daily., Disp: 30 each, Rfl: 0   Blood Glucose Monitoring Suppl (  ACCU-CHEK GUIDE ME) w/Device KIT, USE AS DIRECTED, Disp: 1 kit, Rfl: 0   carvedilol (COREG) 3.125 MG tablet, Take 1 tablet (3.125 mg total) by mouth 2 (two) times daily., Disp: 180 tablet, Rfl: 1   citalopram (CELEXA) 10 MG tablet, Take 1 tablet (10 mg total) by mouth daily., Disp: 30 tablet, Rfl: 1   ezetimibe (ZETIA) 10 MG tablet, Take 1 tablet (10 mg total) by mouth daily., Disp: 90 tablet, Rfl: 1   losartan (COZAAR) 25 MG tablet, Take 1 tablet (25 mg total) by mouth daily., Disp: 90 tablet, Rfl: 1   metFORMIN (GLUCOPHAGE-XR) 750 MG 24 hr tablet, TAKE 1 TABLET BY MOUTH EVERY DAY WITH BREAKFAST, Disp: 90 tablet, Rfl: 1   omeprazole (PRILOSEC) 40 MG capsule, Take 1 capsule (40 mg total) by mouth daily., Disp: 90 capsule, Rfl: 1   rosuvastatin (CRESTOR) 40 MG  tablet, Take 1 tablet (40 mg total) by mouth daily., Disp: 90 tablet, Rfl: 1   zolpidem (AMBIEN) 10 MG tablet, Take 0.5-1 tablets (5-10 mg total) by mouth at bedtime as needed for sleep., Disp: 90 tablet, Rfl: 1  Allergies  Allergen Reactions   Metformin And Related Diarrhea    And nausea. Tolerates XR formulation    I personally reviewed active problem list, medication list, allergies, family history, social history, health maintenance with the patient/caregiver today.   ROS  Ten systems reviewed and is negative except as mentioned in HPI   Objective  Vitals:   12/13/21 1017  BP: 108/60  Pulse: 76  Resp: 16  SpO2: 98%  Weight: 150 lb (68 kg)  Height: 5\' 3"  (1.6 m)    Body mass index is 26.57 kg/m.  Physical Exam  Constitutional: Patient appears well-developed and well-nourished.  No distress.  HEENT: head atraumatic, normocephalic, pupils equal and reactive to light, neck supple Cardiovascular: Normal rate, regular rhythm and normal heart sounds.  No murmur heard. No BLE edema. Pulmonary/Chest: Effort normal and breath sounds normal. No respiratory distress. Abdominal: Soft.  There is no tenderness. Muscular skeletal: pain during palpation of trapezium muscle on left side, normal rom of shoulders, normal rom of spine, negative straight leg raise Psychiatric: Patient has a normal mood and affect. behavior is normal. Judgment and thought content normal.   Recent Results (from the past 2160 hour(s))  HM DIABETES EYE EXAM     Status: None   Collection Time: 10/16/21 12:00 AM  Result Value Ref Range   HM Diabetic Eye Exam No Retinopathy No Retinopathy  POCT Urinalysis Dipstick     Status: Abnormal   Collection Time: 10/29/21  2:20 PM  Result Value Ref Range   Color, UA Yellow    Clarity, UA Clear    Glucose, UA Negative Negative   Bilirubin, UA Negative    Ketones, UA Negative    Spec Grav, UA 1.025 1.010 - 1.025   Blood, UA Negative    pH, UA 6.0 5.0 - 8.0    Protein, UA Positive (A) Negative    Comment: Trace   Urobilinogen, UA 0.2 0.2 or 1.0 E.U./dL   Nitrite, UA Negative    Leukocytes, UA Trace (A) Negative   Appearance Clear    Odor Strong   CULTURE, URINE COMPREHENSIVE     Status: Abnormal   Collection Time: 10/29/21  3:44 PM   Specimen: Urine  Result Value Ref Range   MICRO NUMBER: [id number]    SPECIMEN QUALITY: Adequate    Source OTHER (SPECIFY)    STATUS: FINAL  ISOLATE 1: Gram positive cocci isolated (A)     Comment: 10,000-49,000 CFU/mL of Gram positive cocci isolated Gram positive cocci isolated Gram positive cocci isolated May represent colonizers from external and internal genitalia. No further testing (including susceptibility) will be performed.  Cervicovaginal ancillary only     Status: Abnormal   Collection Time: 12/04/21  8:25 AM  Result Value Ref Range   Neisseria Gonorrhea Negative    Chlamydia Negative    Trichomonas Negative    Bacterial Vaginitis (gardnerella) Positive (A)    Candida Vaginitis Negative    Candida Glabrata Negative    Comment      Normal Reference Range Bacterial Vaginosis - Negative   Comment Normal Reference Range Candida Species - Negative    Comment Normal Reference Range Candida Galbrata - Negative    Comment Normal Reference Range Trichomonas - Negative    Comment Normal Reference Ranger Chlamydia - Negative    Comment      Normal Reference Range Neisseria Gonorrhea - Negative  CULTURE, URINE COMPREHENSIVE     Status: Abnormal   Collection Time: 12/04/21  8:37 AM   Specimen: Urine  Result Value Ref Range   MICRO NUMBER: [id number]    SPECIMEN QUALITY: Adequate    Source OTHER (SPECIFY)    STATUS: FINAL    RESULT: Gram positive cocci isolated (A)     Comment: 1,000-9,000 CFU/ML of Gram positive cocci isolated Gram positive bacilli isolated May represent colonizers from external and internal genitalia. No further testing (including susceptibility) will be performed.  Urinalysis,  Complete     Status: Abnormal   Collection Time: 12/04/21  8:37 AM  Result Value Ref Range   Color, Urine YELLOW YELLOW   APPearance CLOUDY (A) CLEAR   Specific Gravity, Urine 1.025 1.001 - 1.035   pH 5.5 5.0 - 8.0   Glucose, UA NEGATIVE NEGATIVE   Bilirubin Urine NEGATIVE NEGATIVE   Ketones, ur NEGATIVE NEGATIVE   Hgb urine dipstick TRACE (A) NEGATIVE   Protein, ur 1+ (A) NEGATIVE   Nitrite NEGATIVE NEGATIVE   Leukocytes,Ua 3+ (A) NEGATIVE   WBC, UA 20-40 (A) 0 - 5 /HPF   RBC / HPF NONE SEEN 0 - 2 /HPF   Squamous Epithelial / LPF 0-5 < OR = 5 /HPF   Bacteria, UA NONE SEEN NONE SEEN /HPF   Hyaline Cast NONE SEEN NONE SEEN /LPF    PHQ2/9:    12/13/2021   10:17 AM 11/20/2021   11:14 AM 10/29/2021    2:08 PM 09/07/2021    1:21 PM 05/09/2021    1:26 PM  Depression screen PHQ 2/9  Decreased Interest 1 0 0 0 0  Down, Depressed, Hopeless 1 0 0 0 0  PHQ - 2 Score 2 0 0 0 0  Altered sleeping 0 3 3  0  Tired, decreased energy 0 0 0  0  Change in appetite 1 1 1   0  Feeling bad or failure about yourself  0 0 0  0  Trouble concentrating 0 0 0  0  Moving slowly or fidgety/restless 0 0 0  0  Suicidal thoughts 0 0 0  0  PHQ-9 Score 3 4 4   0    phq 9 is positive   Fall Risk:    12/13/2021   10:16 AM 12/04/2021    7:55 AM 11/20/2021   11:34 AM 10/29/2021    2:07 PM 09/07/2021    1:21 PM  Fall Risk   Falls in the  past year? 0 0 0 0 0  Number falls in past yr: 0 0 0 0   Injury with Fall? 0 0 0 0   Risk for fall due to : No Fall Risks No Fall Risks No Fall Risks No Fall Risks   Follow up Falls prevention discussed Falls prevention discussed Falls prevention discussed Falls prevention discussed Falls prevention discussed     Functional Status Survey: Is the patient deaf or have difficulty hearing?: No Does the patient have difficulty seeing, even when wearing glasses/contacts?: No Does the patient have difficulty concentrating, remembering, or making decisions?: No Does the patient  have difficulty walking or climbing stairs?: No Does the patient have difficulty dressing or bathing?: No Does the patient have difficulty doing errands alone such as visiting a doctor's office or shopping?: No    Assessment & Plan  1. Left lumbar radiculitis  - pregabalin (LYRICA) 50 MG capsule; Take 1 capsule (50 mg total) by mouth 3 (three) times daily.  Dispense: 90 capsule; Refill: 0 - DG Lumbar Spine Complete; Future  2. Radiculitis, cervical  - pregabalin (LYRICA) 50 MG capsule; Take 1 capsule (50 mg total) by mouth 3 (three) times daily.  Dispense: 90 capsule; Refill: 0 - DG Cervical Spine Complete; Future  3. Mild episode of recurrent major depressive disorder (HCC)    - citalopram (CELEXA) 20 MG tablet; Take 1 tablet (20 mg total) by mouth daily.  Dispense: 90 tablet; Refill: 0   4. Neck muscle spasm  - tiZANidine (ZANAFLEX) 2 MG tablet; Take 1 tablet (2 mg total) by mouth every 6 (six) hours as needed for muscle spasms.  Dispense: 30 tablet; Refill: 0

## 2021-12-13 ENCOUNTER — Ambulatory Visit
Admission: RE | Admit: 2021-12-13 | Discharge: 2021-12-13 | Disposition: A | Discharge status: Home / Self Care | Payer: Medicare Other | Attending: Family Medicine | Admitting: Family Medicine

## 2021-12-13 ENCOUNTER — Ambulatory Visit
Admission: RE | Admit: 2021-12-13 | Discharge: 2021-12-13 | Disposition: A | Discharge status: Home / Self Care | Payer: Medicare Other | Source: Ambulatory Visit | Attending: Family Medicine | Admitting: Family Medicine

## 2021-12-13 ENCOUNTER — Ambulatory Visit (INDEPENDENT_AMBULATORY_CARE_PROVIDER_SITE_OTHER): Payer: Medicare Other | Admitting: Family Medicine

## 2021-12-13 ENCOUNTER — Encounter: Payer: Self-pay | Admitting: Family Medicine

## 2021-12-13 VITALS — BP 108/60 | HR 76 | Resp 16 | Ht 63.0 in | Wt 150.0 lb

## 2021-12-13 DIAGNOSIS — F330 Major depressive disorder, recurrent, mild: Secondary | ICD-10-CM | POA: Insufficient documentation

## 2021-12-13 DIAGNOSIS — M62838 Other muscle spasm: Secondary | ICD-10-CM | POA: Diagnosis not present

## 2021-12-13 DIAGNOSIS — M5416 Radiculopathy, lumbar region: Secondary | ICD-10-CM

## 2021-12-13 DIAGNOSIS — M5412 Radiculopathy, cervical region: Secondary | ICD-10-CM

## 2021-12-13 MED ORDER — TIZANIDINE HCL 2 MG PO TABS: 2.0000 mg | ORAL_TABLET | Freq: Four times a day (QID) | ORAL | 0 refills | Status: DC | PRN

## 2021-12-13 MED ORDER — CITALOPRAM HYDROBROMIDE 20 MG PO TABS: 20.0000 mg | ORAL_TABLET | Freq: Every day | ORAL | 0 refills | Status: DC

## 2021-12-13 MED ORDER — PREGABALIN 50 MG PO CAPS: 50.0000 mg | ORAL_CAPSULE | Freq: Three times a day (TID) | ORAL | 0 refills | Status: DC

## 2021-12-17 ENCOUNTER — Other Ambulatory Visit: Payer: Self-pay | Admitting: Family Medicine

## 2021-12-17 DIAGNOSIS — M62838 Other muscle spasm: Secondary | ICD-10-CM

## 2021-12-26 ENCOUNTER — Other Ambulatory Visit: Payer: Self-pay | Admitting: Family Medicine

## 2022-01-07 ENCOUNTER — Ambulatory Visit: Payer: Medicare Other | Admitting: Family Medicine

## 2022-01-08 ENCOUNTER — Other Ambulatory Visit: Payer: Self-pay | Admitting: Family Medicine

## 2022-01-08 DIAGNOSIS — I2510 Atherosclerotic heart disease of native coronary artery without angina pectoris: Secondary | ICD-10-CM

## 2022-01-08 DIAGNOSIS — E785 Hyperlipidemia, unspecified: Secondary | ICD-10-CM

## 2022-01-08 NOTE — Telephone Encounter (Signed)
Medication Refill - Medication: ezetimibe (ZETIA) 10 MG tablet  Has the patient contacted their pharmacy? No.   Preferred Pharmacy (with phone number or street name):   [id number] [place], [place] - [address] AT [facility] Phone:  [phone]  Fax:  [phone]     Has the patient been seen for an appointment in the last year OR does the patient have an upcoming appointment? Yes.

## 2022-01-09 MED ORDER — EZETIMIBE 10 MG PO TABS: 10.0000 mg | ORAL_TABLET | Freq: Every day | ORAL | 0 refills | Status: DC

## 2022-01-09 NOTE — Telephone Encounter (Signed)
Requested Prescriptions  Pending Prescriptions Disp Refills  . ezetimibe (ZETIA) 10 MG tablet 90 tablet 0    Sig: Take 1 tablet (10 mg total) by mouth daily.     Cardiovascular:  Antilipid - Sterol Transport Inhibitors Failed - 01/08/2022 11:58 AM      Failed - Lipid Panel in normal range within the last 12 months    Cholesterol, Total  Date Value Ref Range Status  11/18/2014 225 (H) 100 - 199 mg/dL Final   Cholesterol  Date Value Ref Range Status  09/07/2021 124 <200 mg/dL Final   LDL Cholesterol (Calc)  Date Value Ref Range Status  09/07/2021 66 mg/dL (calc) Final    Comment:    Reference range: <100 . Desirable range <100 mg/dL for primary prevention;   <70 mg/dL for patients with CHD or diabetic patients  with > or = 2 CHD risk factors. Marland Kitchen LDL-C is now calculated using the Martin-Hopkins  calculation, which is a validated novel method providing  better accuracy than the Friedewald equation in the  estimation of LDL-C.  Horald Pollen et al. Lenox Ahr. 3716;967(89): 2061-2068  (http://education.[facility].com/faq/FAQ164)    HDL  Date Value Ref Range Status  09/07/2021 40 (L) > OR = 50 mg/dL Final  38/03/1750 38 (L) >39 mg/dL Final    Comment:    According to ATP-III Guidelines, HDL-C >59 mg/dL is considered a negative risk factor for CHD.    Triglycerides  Date Value Ref Range Status  09/07/2021 97 <150 mg/dL Final         Passed - AST in normal range and within 360 days    AST  Date Value Ref Range Status  09/07/2021 21 10 - 35 U/L Final         Passed - ALT in normal range and within 360 days    ALT  Date Value Ref Range Status  09/07/2021 22 6 - 29 U/L Final         Passed - Patient is not pregnant      Passed - Valid encounter within last 12 months    Recent Outpatient Visits          3 weeks ago Left lumbar radiculitis   [facility] [place], Danna Hefty, MD   1 month ago Acute bilateral low back pain without sciatica   [facility]  [facility] Alba Cory, MD   2 months ago Dysuria   [facility], Danna Hefty, MD   4 months ago Type 2 diabetes mellitus with microalbuminuria, without long-term current use of insulin [facility])   [facility], Danna Hefty, MD   8 months ago Type 2 diabetes mellitus with microalbuminuria, without long-term current use of insulin [facility]   [facility] Alba Cory, MD      Future Appointments            In 2 weeks Alba Cory, MD [facility], [facility]

## 2022-01-22 NOTE — Progress Notes (Deleted)
Name: Dawn Werner   MRN: [id number]    DOB: 07-11-1950   Date:01/22/2022       Progress Note  Subjective  Chief Complaint  Follow Up  HPI  MDD marital problems: she has been married for the past 30 years, however over the past 10 years when they became members of a larger church. He is a deacon and he likes being the center or attention. He is a Librarian, academic , and about 15 years ago she cheated on her. She was crying daily and lost of weight, she states on her scales the weight is stable for the past couple of weeks. She was not feeling appreciated, he did  not want to talk about it . She is waiting for therapy visit, but has been able to communicate better with her husband , she is still crying but not as often, appetite is improving. She is not going to their church to prevent herself from getting upset at him -She does not like how he acts at church - like a celebrity   Chronic low back pain with radiculitis: she is able to work in her yard but has been having a constant aching sensation on lumbar area, but has noticed a shooting/sharp pain that takes her breath away towards her left groin when walking. No weakness, no rashes She was having symptoms of urinary frequency but urine culture was negative.   Left cervical radiculitis: she has noticed pain that goes from left side of the neck to lateral left arm  DMII:  Her  hgbA1C went down from 7.8%  to 7.3%, up to 7.4%,  7.3% ,6.9%, 6.8%, 7.5% 6.8% down to 6.5% , 6.4 % 7.1%,down to 6.9 % ,7 % last visit it was 6.8 % . She has improved her diet.  She  has history of nephropathy and is on ARB we will recheck urine micro today.She denies polyphagia, polydipsia or polyuria. She is taking Metformin ER to 750 mg ER and denies side effects of medications Compliant with statin therapy.    OA both knees and left hip: she states symptoms improved, seen by Ortho in the past , she is active , she has a rose garden   Hyperlipidemia: she is back on Crestor  40 mg and Zetia 10 mg daily Denies myalgias Last LDL at goal at 64 , she is due for repeat labs today    HTN:  No chest pain, palpitation or SOB , taking medication as prescribed. Explained bp has been towards low end of normal, decrease dose of Losartan from 50 mg to 25 mg last visit and we will decrease carvedilol today from 6.25 to 3.125   CAD: no episodes, s/p history of MI in 2009, taking aspirin daily,ARB,  beta-blocker and statin therapy. No chest pain or  decrease in exercise tolerance. No longer seeing a cardiologist.    Asthma Mild Intermittent: she has been doing well, no cough, wheezing or SOB lately, she has to use Breo daily during the month of March due to high pollen count , she still has rescue inhaler at home and does not need a refill  Atherosclerosis of aorta: on statin, aspirin  81 mg daily  last LDL at goal Continue medication   GERD: doing well on Omeprazole . No heartburn or indigestion   Insomnia: she had weaned self off Ambien but has been taking otc sleep aids and still only able to fall asleep around 3 am and has interrupted sleep. She had some  old Ambien pills and has been taking a third of a tablet and it has helped significantly, she would like to resume taking it, she will try prn only. She is aware of side effects.   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   Primary osteoarthritis of knee 01/24/2015   Asthma, mild intermittent 11/18/2014   Arteriosclerosis of coronary artery 11/18/2014   Insomnia, persistent 11/18/2014    Dyslipidemia 11/18/2014   Acid reflux 11/18/2014   Diabetes type 2, controlled ([place]) 11/18/2014   Genital herpes in women 11/18/2014   Hypertension, benign 11/18/2014   Asymptomatic postmenopausal status 11/18/2014   History of acute myocardial infarction 11/18/2014    Past Surgical History:  Procedure Laterality Date   ABDOMINAL HYSTERECTOMY     BREAST SURGERY     breast reduction   REDUCTION MAMMAPLASTY Bilateral 1995   TUBAL LIGATION      Family History  Problem Relation Age of Onset   Hyperlipidemia Mother    Hypertension Mother    Stroke Mother    Dementia Father    Cancer Sister        breast   Breast cancer Sister 79   Asthma Brother    Depression Brother        bipolar   Heart disease Brother    Heart disease Brother     Social History   Tobacco Use   Smoking status: Never   Smokeless tobacco: Never  Substance Use Topics   Alcohol use: No    Alcohol/week: 0.0 standard drinks of alcohol     Current Outpatient Medications:    albuterol (VENTOLIN HFA) 108 (90 Base) MCG/ACT inhaler, Inhale 2 puffs into the lungs as needed., Disp: , Rfl:    baclofen (LIORESAL) 10 MG tablet, Take 1 tablet (10 mg total) by mouth 3 (three) times daily., Disp: 30 each, Rfl: 0   Blood Glucose Monitoring Suppl (ACCU-CHEK GUIDE ME) w/Device KIT, USE AS DIRECTED, Disp: 1 kit, Rfl: 0   carvedilol (COREG) 3.125 MG tablet, Take 1 tablet (3.125 mg total) by mouth 2 (two) times daily., Disp: 180 tablet, Rfl: 1   citalopram (CELEXA) 20 MG tablet, Take 1 tablet (20 mg total) by mouth daily., Disp: 90 tablet, Rfl: 0   ezetimibe (ZETIA) 10 MG tablet, Take 1 tablet (10 mg total) by mouth daily., Disp: 90 tablet, Rfl: 0   losartan (COZAAR) 25 MG tablet, Take 1 tablet (25 mg total) by mouth daily., Disp: 90 tablet, Rfl: 1   metFORMIN (GLUCOPHAGE-XR) 750 MG 24 hr tablet, TAKE 1 TABLET BY MOUTH EVERY DAY WITH BREAKFAST, Disp: 90 tablet, Rfl: 1   omeprazole (PRILOSEC) 40 MG capsule, Take 1 capsule  (40 mg total) by mouth daily., Disp: 90 capsule, Rfl: 1   pregabalin (LYRICA) 50 MG capsule, Take 1 capsule (50 mg total) by mouth 3 (three) times daily., Disp: 90 capsule, Rfl: 0   rosuvastatin (CRESTOR) 40 MG tablet, Take 1 tablet (40 mg total) by mouth daily., Disp: 90 tablet, Rfl: 1   tiZANidine (ZANAFLEX) 2 MG tablet, Take 1 tablet (2 mg total) by  mouth every 6 (six) hours as needed for muscle spasms., Disp: 30 tablet, Rfl: 0   zolpidem (AMBIEN) 10 MG tablet, Take 0.5-1 tablets (5-10 mg total) by mouth at bedtime as needed for sleep., Disp: 90 tablet, Rfl: 1  Allergies  Allergen Reactions   Metformin And Related Diarrhea    And nausea. Tolerates XR formulation    I personally reviewed active problem list, medication list, allergies, family history, social history, health maintenance with the patient/caregiver today.   ROS  ***  Objective  There were no vitals filed for this visit.  There is no height or weight on file to calculate BMI.  Physical Exam ***  Recent Results (from the past 2160 hour(s))  POCT Urinalysis Dipstick     Status: Abnormal   Collection Time: 10/29/21  2:20 PM  Result Value Ref Range   Color, UA Yellow    Clarity, UA Clear    Glucose, UA Negative Negative   Bilirubin, UA Negative    Ketones, UA Negative    Spec Grav, UA 1.025 1.010 - 1.025   Blood, UA Negative    pH, UA 6.0 5.0 - 8.0   Protein, UA Positive (A) Negative    Comment: Trace   Urobilinogen, UA 0.2 0.2 or 1.0 E.U./dL   Nitrite, UA Negative    Leukocytes, UA Trace (A) Negative   Appearance Clear    Odor Strong   CULTURE, URINE COMPREHENSIVE     Status: Abnormal   Collection Time: 10/29/21  3:44 PM   Specimen: Urine  Result Value Ref Range   MICRO NUMBER: [id number]    SPECIMEN QUALITY: Adequate    Source OTHER (SPECIFY)    STATUS: FINAL    ISOLATE 1: Gram positive cocci isolated (A)     Comment: 10,000-49,000 CFU/mL of Gram positive cocci isolated Gram positive cocci isolated  Gram positive cocci isolated May represent colonizers from external and internal genitalia. No further testing (including susceptibility) will be performed.  Cervicovaginal ancillary only     Status: Abnormal   Collection Time: 12/04/21  8:25 AM  Result Value Ref Range   Neisseria Gonorrhea Negative    Chlamydia Negative    Trichomonas Negative    Bacterial Vaginitis (gardnerella) Positive (A)    Candida Vaginitis Negative    Candida Glabrata Negative    Comment      Normal Reference Range Bacterial Vaginosis - Negative   Comment Normal Reference Range Candida Species - Negative    Comment Normal Reference Range Candida Galbrata - Negative    Comment Normal Reference Range Trichomonas - Negative    Comment Normal Reference [place] Chlamydia - Negative    Comment      Normal Reference Range Neisseria Gonorrhea - Negative  CULTURE, URINE COMPREHENSIVE     Status: Abnormal   Collection Time: 12/04/21  8:37 AM   Specimen: Urine  Result Value Ref Range   MICRO NUMBER: [id number]    SPECIMEN QUALITY: Adequate    Source OTHER (SPECIFY)    STATUS: FINAL    RESULT: Gram positive cocci isolated (A)     Comment: 1,000-9,000 CFU/ML of Gram positive cocci isolated Gram positive bacilli isolated May represent colonizers from external and internal genitalia. No further testing (including susceptibility) will be performed.  Urinalysis, Complete     Status: Abnormal   Collection Time: 12/04/21  8:37 AM  Result Value Ref Range   Color, Urine YELLOW YELLOW   APPearance CLOUDY (A) CLEAR   Specific Gravity, Urine 1.025 1.001 -  1.035   pH 5.5 5.0 - 8.0   Glucose, UA NEGATIVE NEGATIVE   Bilirubin Urine NEGATIVE NEGATIVE   Ketones, ur NEGATIVE NEGATIVE   Hgb urine dipstick TRACE (A) NEGATIVE   Protein, ur 1+ (A) NEGATIVE   Nitrite NEGATIVE NEGATIVE   Leukocytes,Ua 3+ (A) NEGATIVE   WBC, UA 20-40 (A) 0 - 5 /HPF   RBC / HPF NONE SEEN 0 - 2 /HPF   Squamous Epithelial / LPF 0-5 < OR = 5 /HPF    Bacteria, UA NONE SEEN NONE SEEN /HPF   Hyaline Cast NONE SEEN NONE SEEN /LPF    Diabetic Foot Exam: Diabetic Foot Exam - Simple   No data filed    ***  PHQ2/9:    12/13/2021   10:17 AM 11/20/2021   11:14 AM 10/29/2021    2:08 PM 09/07/2021    1:21 PM 05/09/2021    1:26 PM  Depression screen PHQ 2/9  Decreased Interest 1 0 0 0 0  Down, Depressed, Hopeless 1 0 0 0 0  PHQ - 2 Score 2 0 0 0 0  Altered sleeping 0 3 3  0  Tired, decreased energy 0 0 0  0  Change in appetite 1 1 1   0  Feeling bad or failure about yourself  0 0 0  0  Trouble concentrating 0 0 0  0  Moving slowly or fidgety/restless 0 0 0  0  Suicidal thoughts 0 0 0  0  PHQ-9 Score 3 4 4   0    phq 9 is {gen pos [id number]}   Fall Risk:    12/13/2021   10:16 AM 12/04/2021    7:55 AM 11/20/2021   11:34 AM 10/29/2021    2:07 PM 09/07/2021    1:21 PM  Fall Risk   Falls in the past year? 0 0 0 0 0  Number falls in past yr: 0 0 0 0   Injury with Fall? 0 0 0 0   Risk for fall due to : No Fall Risks No Fall Risks No Fall Risks No Fall Risks   Follow up Falls prevention discussed Falls prevention discussed Falls prevention discussed Falls prevention discussed Falls prevention discussed      Functional Status Survey:      Assessment & Plan  *** There are no diagnoses linked to this encounter.

## 2022-01-23 ENCOUNTER — Ambulatory Visit: Payer: Medicare Other | Admitting: Family Medicine

## 2022-01-23 DIAGNOSIS — R809 Proteinuria, unspecified: Secondary | ICD-10-CM

## 2022-01-24 NOTE — Progress Notes (Signed)
Name: Dawn Werner   MRN: [id number]    DOB: 01-Jul-1950   Date:01/25/2022       Progress Note  Subjective  Chief Complaint  Follow Up  HPI  MDD marital problems: she has been married for the past 19 years, however over the past 10 years when they became members of a larger church. He is a deacon and he likes being the center or attention. He is a Librarian, academic , and about 15 years ago she cheated on her. She was crying daily and lost of weight, she states on her scales the weight is stable for the past couple of weeks. She was not feeling appreciated, he did  not want to talk about it . Since she started on Celexa they have been communicating better, she is still not going to church with him but her mood and the relationship with her husband have improved   Chronic low back pain with radiculitis: she was seen by Ortho, had left trochanteric bursa injection and has been doing well since, still takes lyrica usually mid day, tolerating it well and helps with pain   Left cervical radiculitis: she has noticed pain that goes from left side of the neck to lateral left arm, she has been taking lyrica mostly for outer hip now, instead of neck   DMII:  Her  hgbA1C went down from 7.8%  to 7.3%, up to 7.4%,  7.3% ,6.9%, 6.8%, 7.5% 6.8% down to 6.5% , 6.4 % 7.1%,down to 6.9 % ,7 % last visit it was 6.8 % 6.3 %  She  has history of nephropathy and is on ARB we will recheck urine micro today.She denies polyphagia, polydipsia or polyuria. She is taking Metformin ER to 750 mg ER and denies side effects of medications    OA both knees and left hip: she states symptoms improved, seen by Ortho in the past , she is active , she has a rose garden and is active   Hyperlipidemia: she is back on Crestor 40 mg and Zetia 10 mg daily Denies myalgias Last LDL at goal at 66   HTN:  No chest pain, palpitation or SOB , taking medication as prescribed. BP is at good   CAD: no episodes, s/p history of MI in 2009, taking  aspirin daily,ARB,  beta-blocker and statin therapy. No chest pain or  decrease in exercise tolerance. No longer seeing a cardiologist. Compliant with medications    Asthma Mild Intermittent: she has been doing well, no cough, wheezing or SOB lately, she has to use Breo daily during the month of March due to high pollen count , she still has rescue inhaler at home and does not need a refill. Unchanged   Atherosclerosis of aorta: on statin, aspirin  81 mg daily  last LDL at goal Continue medications   GERD: doing well on Omeprazole prn when triggered by certain types of food   Insomnia: she had weaned self off but in March she resumed taking it, she does not take it every night and sometimes half a pill. 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   Primary osteoarthritis of knee 01/24/2015   Asthma, mild intermittent 11/18/2014   Arteriosclerosis of coronary artery 11/18/2014   Insomnia, persistent 11/18/2014   Dyslipidemia 11/18/2014   Acid reflux 11/18/2014   Diabetes type 2, controlled ([place]) 11/18/2014   Genital herpes in women 11/18/2014   Hypertension, benign 11/18/2014   Asymptomatic postmenopausal status 11/18/2014   History of acute myocardial infarction 11/18/2014    Past Surgical History:  Procedure Laterality Date   ABDOMINAL HYSTERECTOMY     BREAST SURGERY     breast reduction   REDUCTION MAMMAPLASTY Bilateral 1995   TUBAL LIGATION      Family History  Problem Relation Age of Onset   Hyperlipidemia  Mother    Hypertension Mother    Stroke Mother    Dementia Father    Cancer Sister        breast   Breast cancer Sister 30   Asthma Brother    Depression Brother        bipolar   Heart disease Brother    Heart disease Brother     Social History   Tobacco Use   Smoking status: Never   Smokeless tobacco: Never  Substance Use Topics   Alcohol use: No    Alcohol/week: 0.0 standard drinks of alcohol     Current Outpatient Medications:    albuterol (VENTOLIN HFA) 108 (90 Base) MCG/ACT inhaler, Inhale 2 puffs into the lungs as needed., Disp: , Rfl:    Blood Glucose Monitoring Suppl (ACCU-CHEK GUIDE ME) w/Device KIT, USE AS DIRECTED, Disp: 1 kit, Rfl: 0   tiZANidine (ZANAFLEX) 2 MG tablet, Take 1 tablet (2 mg total) by mouth every 6 (six) hours as needed for muscle spasms., Disp: 30 tablet, Rfl: 0   carvedilol (COREG) 3.125 MG tablet, Take 1 tablet (3.125 mg total) by mouth 2 (two) times daily., Disp: 180 tablet, Rfl: 1   citalopram (CELEXA) 20 MG tablet, Take 1 tablet (20 mg total) by mouth daily., Disp: 90 tablet, Rfl: 1   ezetimibe (ZETIA) 10 MG tablet, Take 1 tablet (10 mg total) by mouth daily., Disp: 90 tablet, Rfl: 1   losartan (COZAAR) 25 MG tablet, Take 1 tablet (25 mg total) by mouth daily., Disp: 90 tablet, Rfl: 1   metFORMIN (GLUCOPHAGE-XR) 750 MG 24 hr tablet, TAKE 1 TABLET BY MOUTH EVERY DAY WITH BREAKFAST, Disp: 90 tablet, Rfl: 1   omeprazole (PRILOSEC) 40 MG capsule, Take 1 capsule (40 mg total) by mouth daily., Disp: 90 capsule, Rfl: 1   pregabalin (LYRICA) 50 MG capsule, Take 1 capsule (50 mg total) by mouth 2 (two) times daily., Disp: 180 capsule, Rfl: 0   rosuvastatin (CRESTOR) 40 MG tablet, Take 1 tablet (40 mg total) by mouth daily., Disp: 90 tablet, Rfl: 1   zolpidem (AMBIEN) 10 MG tablet, Take 0.5-1 tablets (5-10 mg total) by mouth at bedtime as needed for sleep., Disp: 90 tablet, Rfl: 1  Allergies  Allergen Reactions   Metformin And Related Diarrhea    And  nausea. Tolerates XR formulation    I personally reviewed active problem list, medication list, allergies, family history, social history, health maintenance with the patient/caregiver today.   ROS  Constitutional: Negative for fever or weight change.  Respiratory: Negative for cough and shortness of breath.   Cardiovascular: Negative for chest pain or palpitations.  Gastrointestinal: Negative for abdominal pain, no bowel changes.  Musculoskeletal: Negative for gait  problem or joint swelling.  Skin: Negative for rash.  Neurological: Negative for dizziness or headache.  No other specific complaints in a complete review of systems (except as listed in HPI above).   Objective  Vitals:   01/25/22 1119  BP: 128/78  Pulse: 77  Resp: 16  Temp: 98.1 F (36.7 C)  TempSrc: Oral  SpO2: 98%  Weight: 149 lb 6.4 oz (67.8 kg)  Height: 5' 2"  (1.575 m)    Body mass index is 27.33 kg/m.  Physical Exam  Constitutional: Patient appears well-developed and well-nourished.  No distress.  HEENT: head atraumatic, normocephalic, pupils equal and reactive to light, neck supple Cardiovascular: Normal rate, regular rhythm and normal heart sounds.  No murmur heard. No BLE edema. Pulmonary/Chest: Effort normal and breath sounds normal. No respiratory distress. Abdominal: Soft.  There is no tenderness. Psychiatric: Patient has a normal mood and affect. behavior is normal. Judgment and thought content normal.   Recent Results (from the past 2160 hour(s))  POCT Urinalysis Dipstick     Status: Abnormal   Collection Time: 10/29/21  2:20 PM  Result Value Ref Range   Color, UA Yellow    Clarity, UA Clear    Glucose, UA Negative Negative   Bilirubin, UA Negative    Ketones, UA Negative    Spec Grav, UA 1.025 1.010 - 1.025   Blood, UA Negative    pH, UA 6.0 5.0 - 8.0   Protein, UA Positive (A) Negative    Comment: Trace   Urobilinogen, UA 0.2 0.2 or 1.0 E.U./dL   Nitrite, UA Negative     Leukocytes, UA Trace (A) Negative   Appearance Clear    Odor Strong   CULTURE, URINE COMPREHENSIVE     Status: Abnormal   Collection Time: 10/29/21  3:44 PM   Specimen: Urine  Result Value Ref Range   MICRO NUMBER: [id number]    SPECIMEN QUALITY: Adequate    Source OTHER (SPECIFY)    STATUS: FINAL    ISOLATE 1: Gram positive cocci isolated (A)     Comment: 10,000-49,000 CFU/mL of Gram positive cocci isolated Gram positive cocci isolated Gram positive cocci isolated May represent colonizers from external and internal genitalia. No further testing (including susceptibility) will be performed.  Cervicovaginal ancillary only     Status: Abnormal   Collection Time: 12/04/21  8:25 AM  Result Value Ref Range   Neisseria Gonorrhea Negative    Chlamydia Negative    Trichomonas Negative    Bacterial Vaginitis (gardnerella) Positive (A)    Candida Vaginitis Negative    Candida Glabrata Negative    Comment      Normal Reference Range Bacterial Vaginosis - Negative   Comment Normal Reference Range Candida Species - Negative    Comment Normal Reference Range Candida Galbrata - Negative    Comment Normal Reference Range Trichomonas - Negative    Comment Normal Reference [place] Chlamydia - Negative    Comment      Normal Reference Range Neisseria Gonorrhea - Negative  CULTURE, URINE COMPREHENSIVE     Status: Abnormal   Collection Time: 12/04/21  8:37 AM   Specimen: Urine  Result Value Ref Range   MICRO NUMBER: [id number]    SPECIMEN QUALITY: Adequate    Source OTHER (SPECIFY)    STATUS: FINAL    RESULT: Gram positive cocci isolated (A)     Comment: 1,000-9,000 CFU/ML of Gram positive cocci isolated Gram positive bacilli isolated May represent colonizers from external and internal genitalia. No  further testing (including susceptibility) will be performed.  Urinalysis, Complete     Status: Abnormal   Collection Time: 12/04/21  8:37 AM  Result Value Ref Range   Color, Urine YELLOW YELLOW    APPearance CLOUDY (A) CLEAR   Specific Gravity, Urine 1.025 1.001 - 1.035   pH 5.5 5.0 - 8.0   Glucose, UA NEGATIVE NEGATIVE   Bilirubin Urine NEGATIVE NEGATIVE   Ketones, ur NEGATIVE NEGATIVE   Hgb urine dipstick TRACE (A) NEGATIVE   Protein, ur 1+ (A) NEGATIVE   Nitrite NEGATIVE NEGATIVE   Leukocytes,Ua 3+ (A) NEGATIVE   WBC, UA 20-40 (A) 0 - 5 /HPF   RBC / HPF NONE SEEN 0 - 2 /HPF   Squamous Epithelial / LPF 0-5 < OR = 5 /HPF   Bacteria, UA NONE SEEN NONE SEEN /HPF   Hyaline Cast NONE SEEN NONE SEEN /LPF  POCT HgB A1C     Status: Normal   Collection Time: 01/25/22 11:23 AM  Result Value Ref Range   Hemoglobin A1C     HbA1c POC (<> result, manual entry) 6.3 4.0 - 5.6 %   HbA1c, POC (prediabetic range)     HbA1c, POC (controlled diabetic range)        PHQ2/9:    01/25/2022   11:22 AM 12/13/2021   10:17 AM 11/20/2021   11:14 AM 10/29/2021    2:08 PM 09/07/2021    1:21 PM  Depression screen PHQ 2/9  Decreased Interest 0 1 0 0 0  Down, Depressed, Hopeless 0 1 0 0 0  PHQ - 2 Score 0 2 0 0 0  Altered sleeping 0 0 3 3   Tired, decreased energy 0 0 0 0   Change in appetite 0 1 1 1    Feeling bad or failure about yourself  0 0 0 0   Trouble concentrating 0 0 0 0   Moving slowly or fidgety/restless 0 0 0 0   Suicidal thoughts 0 0 0 0   PHQ-9 Score 0 3 4 4      phq 9 is negative   Fall Risk:    01/25/2022   11:21 AM 12/13/2021   10:16 AM 12/04/2021    7:55 AM 11/20/2021   11:34 AM 10/29/2021    2:07 PM  Fall Risk   Falls in the past year? 0 0 0 0 0  Number falls in past yr:  0 0 0 0  Injury with Fall?  0 0 0 0  Risk for fall due to :  No Fall Risks No Fall Risks No Fall Risks No Fall Risks  Follow up Falls prevention discussed Falls prevention discussed Falls prevention discussed Falls prevention discussed Falls prevention discussed    Assessment & Plan  1. Type 2 diabetes mellitus with microalbuminuria, without long-term current use of insulin (HCC)  - POCT HgB A1C -  losartan (COZAAR) 25 MG tablet; Take 1 tablet (25 mg total) by mouth daily.  Dispense: 90 tablet; Refill: 1 - metFORMIN (GLUCOPHAGE-XR) 750 MG 24 hr tablet; TAKE 1 TABLET BY MOUTH EVERY DAY WITH BREAKFAST  Dispense: 90 tablet; Refill: 1  2. Essential hypertension  - carvedilol (COREG) 3.125 MG tablet; Take 1 tablet (3.125 mg total) by mouth 2 (two) times daily.  Dispense: 180 tablet; Refill: 1 - losartan (COZAAR) 25 MG tablet; Take 1 tablet (25 mg total) by mouth daily.  Dispense: 90 tablet; Refill: 1  3. History of acute myocardial infarction  - carvedilol (COREG) 3.125 MG  tablet; Take 1 tablet (3.125 mg total) by mouth 2 (two) times daily.  Dispense: 180 tablet; Refill: 1 - losartan (COZAAR) 25 MG tablet; Take 1 tablet (25 mg total) by mouth daily.  Dispense: 90 tablet; Refill: 1 - rosuvastatin (CRESTOR) 40 MG tablet; Take 1 tablet (40 mg total) by mouth daily.  Dispense: 90 tablet; Refill: 1  4. Mild episode of recurrent major depressive disorder (HCC)  - citalopram (CELEXA) 20 MG tablet; Take 1 tablet (20 mg total) by mouth daily.  Dispense: 90 tablet; Refill: 1  5. Arteriosclerosis of coronary artery  - ezetimibe (ZETIA) 10 MG tablet; Take 1 tablet (10 mg total) by mouth daily.  Dispense: 90 tablet; Refill: 1  6. Dyslipidemia  - ezetimibe (ZETIA) 10 MG tablet; Take 1 tablet (10 mg total) by mouth daily.  Dispense: 90 tablet; Refill: 1 - rosuvastatin (CRESTOR) 40 MG tablet; Take 1 tablet (40 mg total) by mouth daily.  Dispense: 90 tablet; Refill: 1  7. Gastroesophageal reflux disease without esophagitis  - omeprazole (PRILOSEC) 40 MG capsule; Take 1 capsule (40 mg total) by mouth daily.  Dispense: 90 capsule; Refill: 1  8. Left lumbar radiculitis  - pregabalin (LYRICA) 50 MG capsule; Take 1 capsule (50 mg total) by mouth 2 (two) times daily.  Dispense: 180 capsule; Refill: 0  9. Radiculitis, cervical  - pregabalin (LYRICA) 50 MG capsule; Take 1 capsule (50 mg total) by mouth  2 (two) times daily.  Dispense: 180 capsule; Refill: 0  10. Insomnia, persistent

## 2022-01-25 ENCOUNTER — Ambulatory Visit (INDEPENDENT_AMBULATORY_CARE_PROVIDER_SITE_OTHER): Payer: Medicare Other | Admitting: Family Medicine

## 2022-01-25 ENCOUNTER — Encounter: Payer: Self-pay | Admitting: Family Medicine

## 2022-01-25 VITALS — BP 128/78 | HR 77 | Temp 98.1°F | Resp 16 | Ht 62.0 in | Wt 149.4 lb

## 2022-01-25 DIAGNOSIS — M5416 Radiculopathy, lumbar region: Secondary | ICD-10-CM

## 2022-01-25 DIAGNOSIS — E1129 Type 2 diabetes mellitus with other diabetic kidney complication: Secondary | ICD-10-CM

## 2022-01-25 DIAGNOSIS — F330 Major depressive disorder, recurrent, mild: Secondary | ICD-10-CM

## 2022-01-25 DIAGNOSIS — I10 Essential (primary) hypertension: Secondary | ICD-10-CM | POA: Diagnosis not present

## 2022-01-25 DIAGNOSIS — R809 Proteinuria, unspecified: Secondary | ICD-10-CM | POA: Diagnosis not present

## 2022-01-25 DIAGNOSIS — G4700 Insomnia, unspecified: Secondary | ICD-10-CM

## 2022-01-25 DIAGNOSIS — I252 Old myocardial infarction: Secondary | ICD-10-CM

## 2022-01-25 DIAGNOSIS — I2510 Atherosclerotic heart disease of native coronary artery without angina pectoris: Secondary | ICD-10-CM

## 2022-01-25 DIAGNOSIS — M5412 Radiculopathy, cervical region: Secondary | ICD-10-CM

## 2022-01-25 DIAGNOSIS — K219 Gastro-esophageal reflux disease without esophagitis: Secondary | ICD-10-CM

## 2022-01-25 DIAGNOSIS — E785 Hyperlipidemia, unspecified: Secondary | ICD-10-CM

## 2022-01-25 LAB — POCT GLYCOSYLATED HEMOGLOBIN (HGB A1C): HbA1c POC (<> result, manual entry): 6.3 % (ref 4.0–5.6)

## 2022-01-25 MED ORDER — OMEPRAZOLE 40 MG PO CPDR: 40.0000 mg | DELAYED_RELEASE_CAPSULE | Freq: Every day | ORAL | 1 refills | Status: DC

## 2022-01-25 MED ORDER — LOSARTAN POTASSIUM 25 MG PO TABS: 25.0000 mg | ORAL_TABLET | Freq: Every day | ORAL | 1 refills | Status: DC

## 2022-01-25 MED ORDER — EZETIMIBE 10 MG PO TABS: 10.0000 mg | ORAL_TABLET | Freq: Every day | ORAL | 1 refills | Status: DC

## 2022-01-25 MED ORDER — METFORMIN HCL ER 750 MG PO TB24: ORAL_TABLET | ORAL | 1 refills | Status: DC

## 2022-01-25 MED ORDER — CARVEDILOL 3.125 MG PO TABS: 3.1250 mg | ORAL_TABLET | Freq: Two times a day (BID) | ORAL | 1 refills | Status: DC

## 2022-01-25 MED ORDER — CITALOPRAM HYDROBROMIDE 20 MG PO TABS: 20.0000 mg | ORAL_TABLET | Freq: Every day | ORAL | 1 refills | Status: DC

## 2022-01-25 MED ORDER — PREGABALIN 50 MG PO CAPS: 50.0000 mg | ORAL_CAPSULE | Freq: Two times a day (BID) | ORAL | 0 refills | Status: DC

## 2022-01-25 MED ORDER — ROSUVASTATIN CALCIUM 40 MG PO TABS: 40.0000 mg | ORAL_TABLET | Freq: Every day | ORAL | 1 refills | Status: DC

## 2022-02-26 ENCOUNTER — Other Ambulatory Visit: Payer: Self-pay | Admitting: Family Medicine

## 2022-02-26 DIAGNOSIS — I252 Old myocardial infarction: Secondary | ICD-10-CM

## 2022-02-26 DIAGNOSIS — I10 Essential (primary) hypertension: Secondary | ICD-10-CM

## 2022-02-26 DIAGNOSIS — K219 Gastro-esophageal reflux disease without esophagitis: Secondary | ICD-10-CM

## 2022-02-26 DIAGNOSIS — E785 Hyperlipidemia, unspecified: Secondary | ICD-10-CM

## 2022-02-26 DIAGNOSIS — R809 Proteinuria, unspecified: Secondary | ICD-10-CM

## 2022-03-01 ENCOUNTER — Other Ambulatory Visit: Payer: Self-pay | Admitting: Family Medicine

## 2022-03-01 DIAGNOSIS — G4700 Insomnia, unspecified: Secondary | ICD-10-CM

## 2022-03-11 ENCOUNTER — Other Ambulatory Visit: Payer: Self-pay | Admitting: Family Medicine

## 2022-03-11 DIAGNOSIS — F330 Major depressive disorder, recurrent, mild: Secondary | ICD-10-CM

## 2022-04-01 ENCOUNTER — Other Ambulatory Visit: Payer: Self-pay | Admitting: Family Medicine

## 2022-04-01 DIAGNOSIS — Z1231 Encounter for screening mammogram for malignant neoplasm of breast: Secondary | ICD-10-CM

## 2022-04-08 ENCOUNTER — Other Ambulatory Visit: Payer: Self-pay | Admitting: Family Medicine

## 2022-04-08 DIAGNOSIS — I2510 Atherosclerotic heart disease of native coronary artery without angina pectoris: Secondary | ICD-10-CM

## 2022-04-08 DIAGNOSIS — E785 Hyperlipidemia, unspecified: Secondary | ICD-10-CM

## 2022-05-21 ENCOUNTER — Other Ambulatory Visit: Payer: Self-pay | Admitting: Family Medicine

## 2022-05-21 DIAGNOSIS — Z1231 Encounter for screening mammogram for malignant neoplasm of breast: Secondary | ICD-10-CM

## 2022-05-24 ENCOUNTER — Ambulatory Visit: Payer: Medicare Other

## 2022-05-24 ENCOUNTER — Ambulatory Visit
Admission: RE | Admit: 2022-05-24 | Discharge: 2022-05-24 | Disposition: A | Discharge status: Home / Self Care | Payer: Medicare Other | Source: Ambulatory Visit | Attending: Family Medicine | Admitting: Family Medicine

## 2022-05-24 DIAGNOSIS — Z1231 Encounter for screening mammogram for malignant neoplasm of breast: Secondary | ICD-10-CM

## 2022-05-24 NOTE — Progress Notes (Unsigned)
Name: Dawn Werner   MRN: [id number]    DOB: [birth year]   Date:05/27/2022       Progress Note  Subjective  Chief Complaint  Follow Up  HPI  MDD marital problems: she has been married for the past 24 years, however over the past 10 years when they became members of a larger church. He is a deacon and he likes being the center or attention. He is a Librarian, academic , and about 15 years ago she cheated on her. She was crying daily and lost of weight. She was not feeling appreciated, he did  not want to talk about it . Since she started on Celexa they have been communicating better, he is not going to church as often due to medical problems, she has not been going to church to avoid conflict with her husband and it has helped   Chronic low back pain with radiculitis: she was seen by Ortho, had left trochanteric bursa injection and has been doing well since, still takes Lyrica usually mid day, tolerating it well and helps with pain , unchanged   Left cervical radiculitis: she has noticed pain that goes from left side of the neck to lateral left arm, she has been taking lyrica mostly for outer hip now, instead of neck   DMII:  Her  hgbA1C went down from 7.8%  to 7.3%, up to 7.4%,  7.3% ,6.9%, 6.8%, 7.5% 6.8% down to 6.5% , 6.4 % 7.1%,down to 6.9 % ,7 % 6.8 % 6.3 % and today is 6.6 %   She has history of nephropathy and is on ARB .She denies polyphagia, polydipsia or polyuria. She is taking Metformin ER to 750 mg ER and denies side effects of medications    OA both knees and left hip: she states symptoms improved, seen by Ortho in the past , she is active , she has a rose garden and is active   Hyperlipidemia: she is back on Crestor 40 mg and Zetia 10 mg daily Denies myalgias Last LDL at goal at 66, we will recheck labs yearly    HTN:  No chest pain, palpitation or SOB , taking medication as prescribed. BP is at good . Continue current regiment   CAD: no episodes, s/p history of MI in 2009, taking  aspirin daily,ARB,  beta-blocker and statin therapy. No longer seeing a cardiologist. Compliant with medications and denies chest pain or decrease in exercise tolerance    Asthma Mild Intermittent: she has been doing well, no cough, wheezing or SOB lately, she has to use Breo daily during the month of March due to high pollen count , she is currently only using rescue prn   Atherosclerosis of aorta: on statin, aspirin  81 mg daily  last LDL at goal  . Recheck it yearly    GERD: doing well on Omeprazole prn when triggered by certain types of food   Insomnia: she had weaned self off but in March she resumed taking it.  She states taking it more often now and needs a refills today   Patient Active Problem List   Diagnosis Date Noted   Mild episode of recurrent major depressive disorder ([place]) 12/13/2021   Radiculitis, cervical 12/13/2021   Left lumbar radiculitis 12/13/2021   Marital problem 10/29/2021   Dysthymia 10/29/2021   Diabetes mellitus with coincident hypertension ([place]) 07/24/2020   Belching 08/18/2018   Chondromalacia of right knee 01/10/2016   Derangement of posterior horn of lateral meniscus of right knee  01/10/2016   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   Primary osteoarthritis of knee 01/24/2015   Asthma, mild intermittent 11/18/2014   Arteriosclerosis of coronary artery 11/18/2014   Insomnia, persistent 11/18/2014   Dyslipidemia 11/18/2014   Acid reflux 11/18/2014   Diabetes type 2, controlled ([place]) 11/18/2014   Genital herpes in women 11/18/2014   Hypertension, benign 11/18/2014   Asymptomatic postmenopausal status 11/18/2014   History of acute myocardial infarction 11/18/2014    Past Surgical History:  Procedure Laterality Date   ABDOMINAL HYSTERECTOMY     BREAST SURGERY     breast reduction   REDUCTION MAMMAPLASTY Bilateral 1995   TUBAL LIGATION      Family History  Problem Relation Age of Onset   Hyperlipidemia Mother    Hypertension Mother    Stroke Mother    Dementia  Father    Cancer Sister        breast   Breast cancer Sister 81   Asthma Brother    Depression Brother        bipolar   Heart disease Brother    Heart disease Brother     Social History   Tobacco Use   Smoking status: Never   Smokeless tobacco: Never  Substance Use Topics   Alcohol use: No    Alcohol/week: 0.0 standard drinks of alcohol     Current Outpatient Medications:    albuterol (VENTOLIN HFA) 108 (90 Base) MCG/ACT inhaler, Inhale 2 puffs into the lungs as needed., Disp: , Rfl:    Blood Glucose Monitoring Suppl (ACCU-CHEK GUIDE ME) w/Device KIT, USE AS DIRECTED, Disp: 1 kit, Rfl: 0   carvedilol (COREG) 3.125 MG tablet, TAKE 1 TABLET(3.125 MG) BY MOUTH TWICE DAILY, Disp: 180 tablet, Rfl: 1   citalopram (CELEXA) 20 MG tablet, TAKE 1 TABLET(20 MG) BY MOUTH DAILY, Disp: 90 tablet, Rfl: 1   ezetimibe (ZETIA) 10 MG tablet, Take 1 tablet (10 mg total) by mouth daily., Disp: 90 tablet, Rfl: 1   losartan (COZAAR) 25 MG tablet, TAKE 1 TABLET(25 MG) BY MOUTH DAILY, Disp: 90 tablet, Rfl: 1   metFORMIN (GLUCOPHAGE-XR) 750 MG 24 hr tablet, TAKE 1 TABLET BY MOUTH EVERY DAY WITH BREAKFAST, Disp: 90 tablet, Rfl: 1   omeprazole (PRILOSEC) 40 MG capsule, TAKE 1 CAPSULE(40 MG) BY MOUTH DAILY, Disp: 90 capsule, Rfl: 1   pregabalin (LYRICA) 50 MG capsule, Take 1 capsule (50 mg total) by mouth 2 (two) times daily., Disp: 180 capsule, Rfl: 0   rosuvastatin (CRESTOR) 40 MG tablet, TAKE 1 TABLET(40 MG) BY MOUTH DAILY, Disp: 90 tablet, Rfl: 1   tiZANidine (ZANAFLEX) 2 MG tablet, Take 1 tablet (2 mg total) by mouth every 6 (six) hours as needed for muscle spasms., Disp: 30 tablet, Rfl: 0   zolpidem (AMBIEN) 10 MG tablet, Take 1 tablet (10 mg total) by mouth at bedtime., Disp: 90 tablet, Rfl: 0  Allergies  Allergen Reactions   Metformin And Related Diarrhea    And nausea. Tolerates XR formulation    I personally reviewed active problem list, medication list, allergies, family history, social  history, health maintenance with the patient/caregiver today.   ROS  Constitutional: Negative for fever , positive for weight change.  Respiratory: Negative for cough and shortness of breath.   Cardiovascular: Negative for chest pain or palpitations.  Gastrointestinal: Negative for abdominal pain, no bowel changes.  Musculoskeletal: Negative for gait problem or joint swelling.  Skin: Negative for rash.  Neurological: Negative for dizziness or headache.  No other specific complaints in  a complete review of systems (except as listed in HPI above).   Objective  Vitals:   05/27/22 1058  BP: 126/72  Pulse: 80  Resp: 16  SpO2: 98%  Weight: 156 lb (70.8 kg)  Height: _0  (1.6 m)    Body mass index is 27.63 kg/m.  Physical Exam  Constitutional: Patient appears well-developed and well-nourished.   No distress.  HEENT: head atraumatic, normocephalic, pupils equal and reactive to light, , neck supple, throat within normal limits Cardiovascular: Normal rate, regular rhythm and normal heart sounds.  No murmur heard. No BLE edema. Pulmonary/Chest: Effort normal and breath sounds normal. No respiratory distress. Abdominal: Soft.  There is no tenderness. Psychiatric: Patient has a normal mood and affect. behavior is normal. Judgment and thought content normal.   Recent Results (from the past 2160 hour(s))  POCT HgB A1C     Status: Abnormal   Collection Time: 05/27/22 11:00 AM  Result Value Ref Range   Hemoglobin A1C 6.6 (A) 4.0 - 5.6 %   HbA1c POC (<> result, manual entry)     HbA1c, POC (prediabetic range)     HbA1c, POC (controlled diabetic range)      Diabetic Foot Exam: Diabetic Foot Exam - Simple   Simple Foot Form Visual Inspection No deformities, no ulcerations, no other skin breakdown bilaterally: Yes Sensation Testing Intact to touch and monofilament testing bilaterally: Yes Pulse Check Posterior Tibialis and Dorsalis pulse intact bilaterally: Yes Comments      PHQ2/9:    05/27/2022   10:59 AM 01/25/2022   11:22 AM 12/13/2021   10:17 AM 11/20/2021   11:14 AM 10/29/2021    2:08 PM  Depression screen PHQ 2/9  Decreased Interest 0 0 1 0 0  Down, Depressed, Hopeless 0 0 1 0 0  PHQ - 2 Score 0 0 2 0 0  Altered sleeping 1 0 0 3 3  Tired, decreased energy 0 0 0 0 0  Change in appetite 0 0 _1 Feeling bad or failure about yourself  0 0 0 0 0  Trouble concentrating 0 0 0 0 0  Moving slowly or fidgety/restless 0 0 0 0 0  Suicidal thoughts 0 0 0 0 0  PHQ-9 Score 1 0 _2 phq 9 is negative   Fall Risk:    05/27/2022   10:59 AM 01/25/2022   11:21 AM 12/13/2021   10:16 AM 12/04/2021    7:55 AM 11/20/2021   11:34 AM  Fall Risk   Falls in the past year? 1 0 0 0 0  Number falls in past yr: 0  0 0 0  Injury with Fall? 0  0 0 0  Risk for fall due to : No Fall Risks  No Fall Risks No Fall Risks No Fall Risks  Follow up _3       Functional Status Survey: Is the patient deaf or have difficulty hearing?: No Does the patient have difficulty seeing, even when wearing glasses/contacts?: No Does the patient have difficulty concentrating, remembering, or making decisions?: No Does the patient have difficulty walking or climbing stairs?: No Does the patient have difficulty dressing or bathing?: No Does the patient have difficulty doing errands alone such as visiting a doctor's office or shopping?: No    Assessment & Plan  1. Type 2 diabetes mellitus with microalbuminuria, without long-term current use of insulin (HCC)  -  POCT HgB A1C - HM Diabetes Foot Exam  2. Mild episode of recurrent major depressive disorder ([place])  Doing better   3. Dyslipidemia associated with type 2 diabetes mellitus ([place])  On statin therapy   4. Need for immunization against influenza  - Flu Vaccine QUAD High Dose(Fluad)  5.  Insomnia, persistent  - zolpidem (AMBIEN) 10 MG tablet; Take 1 tablet (10 mg total) by mouth at bedtime.  Dispense: 90 tablet; Refill: 0  6. Arteriosclerosis of coronary artery   7. Essential hypertension  At goal   8. Gastroesophageal reflux disease without esophagitis   9. Left lumbar radiculitis

## 2022-05-27 ENCOUNTER — Ambulatory Visit (INDEPENDENT_AMBULATORY_CARE_PROVIDER_SITE_OTHER): Payer: Medicare Other | Admitting: Family Medicine

## 2022-05-27 ENCOUNTER — Encounter: Payer: Self-pay | Admitting: Family Medicine

## 2022-05-27 VITALS — BP 126/72 | HR 80 | Resp 16 | Ht 63.0 in | Wt 156.0 lb

## 2022-05-27 DIAGNOSIS — F330 Major depressive disorder, recurrent, mild: Secondary | ICD-10-CM | POA: Diagnosis not present

## 2022-05-27 DIAGNOSIS — Z23 Encounter for immunization: Secondary | ICD-10-CM

## 2022-05-27 DIAGNOSIS — K219 Gastro-esophageal reflux disease without esophagitis: Secondary | ICD-10-CM

## 2022-05-27 DIAGNOSIS — I2510 Atherosclerotic heart disease of native coronary artery without angina pectoris: Secondary | ICD-10-CM

## 2022-05-27 DIAGNOSIS — E785 Hyperlipidemia, unspecified: Secondary | ICD-10-CM

## 2022-05-27 DIAGNOSIS — E1169 Type 2 diabetes mellitus with other specified complication: Secondary | ICD-10-CM | POA: Diagnosis not present

## 2022-05-27 DIAGNOSIS — G4700 Insomnia, unspecified: Secondary | ICD-10-CM | POA: Diagnosis not present

## 2022-05-27 DIAGNOSIS — I10 Essential (primary) hypertension: Secondary | ICD-10-CM

## 2022-05-27 DIAGNOSIS — E1129 Type 2 diabetes mellitus with other diabetic kidney complication: Secondary | ICD-10-CM | POA: Diagnosis not present

## 2022-05-27 DIAGNOSIS — R809 Proteinuria, unspecified: Secondary | ICD-10-CM | POA: Diagnosis not present

## 2022-05-27 DIAGNOSIS — M5416 Radiculopathy, lumbar region: Secondary | ICD-10-CM

## 2022-05-27 LAB — POCT GLYCOSYLATED HEMOGLOBIN (HGB A1C): Hemoglobin A1C: 6.6 % — AB (ref 4.0–5.6)

## 2022-05-27 MED ORDER — ZOLPIDEM TARTRATE 10 MG PO TABS: 10.0000 mg | ORAL_TABLET | Freq: Every day | ORAL | 0 refills | Status: DC

## 2022-05-31 ENCOUNTER — Ambulatory Visit
Admission: EM | Admit: 2022-05-31 | Discharge: 2022-05-31 | Disposition: A | Discharge status: Home / Self Care | Payer: Medicare Other | Attending: Family Medicine | Admitting: Family Medicine

## 2022-05-31 ENCOUNTER — Encounter: Payer: Self-pay | Admitting: Emergency Medicine

## 2022-05-31 DIAGNOSIS — U071 COVID-19: Secondary | ICD-10-CM

## 2022-05-31 LAB — RAPID INFLUENZA A&B ANTIGENS
Influenza A (ARMC): NEGATIVE
Influenza B (ARMC): NEGATIVE

## 2022-05-31 LAB — SARS CORONAVIRUS 2 BY RT PCR: SARS Coronavirus 2 by RT PCR: POSITIVE — AB

## 2022-05-31 MED ORDER — ALBUTEROL SULFATE HFA 108 (90 BASE) MCG/ACT IN AERS: 2.0000 | INHALATION_SPRAY | Freq: Four times a day (QID) | RESPIRATORY_TRACT | 0 refills | Status: AC | PRN

## 2022-05-31 NOTE — ED Provider Notes (Signed)
[facility]    CSN: [id number] Arrival date & time: 05/31/22  9030      History   Chief Complaint Chief Complaint  Patient presents with   Generalized Body Aches   Cough    HPI Dawn Werner is a 71 y.o. female.   HPI   Dawn presents for cough, body aches, runny nose, chills and intermittent headache that started 3 days ago.  She has family coming into town and wanted to be sure she is not giving them anything.  She has not had any vomiting or diarrhea, no shortness of breath or chest discomfort.  Endorses sore throat.  She has been taking some over-the-counter medications without relief.  She has history of asthma.    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   Derangement of posterior horn of lateral meniscus of right knee 01/10/2016   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   Primary osteoarthritis of knee 01/24/2015   Asthma, mild intermittent 11/18/2014   Arteriosclerosis of coronary artery 11/18/2014   Insomnia, persistent 11/18/2014   Dyslipidemia 11/18/2014   Acid reflux 11/18/2014   Diabetes type 2, controlled ([place]) 11/18/2014   Genital herpes in women 11/18/2014   Hypertension, benign 11/18/2014   Asymptomatic postmenopausal status 11/18/2014   History of acute myocardial infarction 11/18/2014    Past Surgical History:  Procedure Laterality Date   ABDOMINAL HYSTERECTOMY     BREAST SURGERY     breast reduction   REDUCTION MAMMAPLASTY  Bilateral 1995   TUBAL LIGATION      OB History   No obstetric history on file.      Home Medications    Prior to Admission medications   Medication Sig Start Date End Date Taking? Authorizing Provider  albuterol (VENTOLIN HFA) 108 (90 Base) MCG/ACT inhaler Inhale 2-4 puffs into the lungs every 6 (six) hours as needed. 05/31/22   Lyndee Hensen, DO  Blood Glucose Monitoring Suppl (ACCU-CHEK GUIDE ME) w/Device KIT USE AS DIRECTED 09/18/21   Ancil Boozer, Drue Stager, MD  carvedilol (COREG) 3.125 MG tablet TAKE 1 TABLET(3.125 MG) BY MOUTH TWICE DAILY 02/26/22   Ancil Boozer, Drue Stager, MD  citalopram (CELEXA) 20 MG tablet TAKE 1 TABLET(20 MG) BY MOUTH DAILY 03/11/22   Steele Sizer, MD  ezetimibe (ZETIA) 10 MG tablet Take 1 tablet (10 mg total) by mouth daily. 01/25/22   Steele Sizer, MD  losartan (COZAAR) 25 MG tablet TAKE 1 TABLET(25 MG) BY MOUTH DAILY 02/26/22   Ancil Boozer, Drue Stager, MD  metFORMIN (GLUCOPHAGE-XR) 750 MG 24 hr tablet TAKE 1 TABLET BY MOUTH EVERY DAY WITH BREAKFAST 02/26/22   Ancil Boozer, Drue Stager, MD  omeprazole (PRILOSEC) 40 MG capsule TAKE 1 CAPSULE(40 MG) BY MOUTH DAILY 02/26/22   Steele Sizer, MD  pregabalin (LYRICA) 50 MG capsule Take 1 capsule (50 mg total) by mouth 2 (two) times daily. 01/25/22   Steele Sizer, MD  rosuvastatin (CRESTOR) 40 MG tablet TAKE 1 TABLET(40 MG) BY MOUTH DAILY 02/26/22   Sowles, Drue Stager,  MD  tiZANidine (ZANAFLEX) 2 MG tablet Take 1 tablet (2 mg total) by mouth every 6 (six) hours as needed for muscle spasms. 12/13/21   Steele Sizer, MD  zolpidem (AMBIEN) 10 MG tablet Take 1 tablet (10 mg total) by mouth at bedtime. 05/27/22   Steele Sizer, MD    Family History Family History  Problem Relation Age of Onset   Hyperlipidemia Mother    Hypertension Mother    Stroke Mother    Dementia Father    Cancer Sister        breast   Breast cancer Sister 83   Asthma Brother    Depression Brother        bipolar   Heart disease Brother    Heart disease Brother      Social History Social History   Tobacco Use   Smoking status: Never   Smokeless tobacco: Never  Vaping Use   Vaping Use: Never used  Substance Use Topics   Alcohol use: No    Alcohol/week: 0.0 standard drinks of alcohol   Drug use: No     Allergies   Metformin and related   Review of Systems Review of Systems: negative unless otherwise stated in HPI.      Physical Exam Triage Vital Signs ED Triage Vitals  Enc Vitals Group     BP 05/31/22 1048 112/68     Pulse Rate 05/31/22 1048 80     Resp 05/31/22 1048 15     Temp 05/31/22 1048 98.6 F (37 C)     Temp Source 05/31/22 1048 Oral     SpO2 05/31/22 1048 95 %     Weight 05/31/22 1045 155 lb (70.3 kg)     Height 05/31/22 1045 _0  (1.6 m)     Head Circumference --      Peak Flow --      Pain Score 05/31/22 1045 5     Pain Loc --      Pain Edu? --      Excl. in [place]? --    No data found.  Updated Vital Signs BP 112/68 (BP Location: Right Arm)   Pulse 80   Temp 98.6 F (37 C) (Oral)   Resp 15   Ht _1  (1.6 m)   Wt 70.3 kg   SpO2 95%   BMI 27.46 kg/m   Visual Acuity Right Eye Distance:   Left Eye Distance:   Bilateral Distance:    Right Eye Near:   Left Eye Near:    Bilateral Near:     Physical Exam GEN:     alert, ill non-toxic appearing elderly female in no distress    HENT:  mucus membranes moist, oropharyngeal without lesions or exudate, no tonsillar hypertrophy,  mild oropharyngeal erythema, moderate erythematous edematous turbinates, clear nasal discharge EYES:   pupils equal and reactive, no scleral injection or discharge NECK:  normal ROM, no meningismus   RESP:  no increased work of breathing, clear to auscultation bilaterally CVS:   regular rate and rhythm Skin:   warm and dry    UC Treatments / Results  Labs (all labs ordered are listed, but only abnormal results are displayed) Labs Reviewed  SARS CORONAVIRUS 2 BY RT PCR - Abnormal; Notable for the following components:       Result Value   SARS Coronavirus 2 by RT PCR POSITIVE (*)    All other components within normal limits  RAPID INFLUENZA A&B ANTIGENS    EKG  Radiology No results found.  Procedures Procedures (including critical care time)  Medications Ordered in UC Medications - No data to display  Initial Impression / Assessment and Plan / UC Course  I have reviewed the triage vital signs and the nursing notes.  Pertinent labs & imaging results that were available during my care of the patient were reviewed by me and considered in my medical decision making (see chart for details).       Pt is a 71 y.o. female who presents for 3 days of respiratory symptoms. Dawn is afebrile here without recent antipyretics. Satting 92-95% on room air. Overall pt is ill but non-toxic appearing, well hydrated, without respiratory distress. Pulmonary exam is unremarkable.  COVID testing obtained and was positive.  History consistent with viral respiratory illness. Discussed symptomatic treatment as she is outside of the treatment window for COVID.  Explained lack of efficacy of antibiotics in viral disease.  Refilled albuterol inhaler and reviewed use.  Typical duration of symptoms discussed.  Quarantine and isolation discussed.  Return and ED precautions given and voiced understanding. Discussed MDM, treatment plan and plan for follow-up with patient who agrees with plan.     Final Clinical Impressions(s) / UC Diagnoses   Final diagnoses:  [id number]     Discharge Instructions      Your home test for COVID-19 was positive, meaning that you were infected with the novel coronavirus and could give the germ to others.  Please continue isolation at home for at least 5 days since the start of your symptoms. Once you complete your 5 day quarantine, you may return to normal activities as long as you've not had a fever for over 24 hours(without taking fever reducing medicine) and your symptoms are improving. Be  sure to wear a mask until Day 11.   If your were prescribed medication. Stop by the pharmacy to pick it up.   Please continue good preventive care measures, including:  frequent hand-washing, avoid touching your face, cover coughs/sneezes, stay out of crowds and keep a 6 foot distance from others.  Go to the nearest hospital emergency room if fever/cough/breathlessness are severe or illness seems like a threat to life.  You can take Tylenol and/or Ibuprofen as needed for fever reduction and pain relief.    For cough: honey 1/2 to 1 teaspoon (you can dilute the honey in water or another fluid).  You can also use guaifenesin and dextromethorphan for cough. You can use a humidifier for chest congestion and cough.  If you don't have a humidifier, you can sit in the bathroom with the hot shower running.      For sore throat: try warm salt water gargles, Mucinex sore throat cough drops or cepacol lozenges, throat spray, warm tea or water with lemon/honey, popsicles or ice, or OTC cold relief medicine for throat discomfort. You can also purchase chloraseptic spray at the pharmacy or dollar store.   For congestion: take a daily anti-histamine like Zyrtec, Claritin, and a oral decongestant, such as pseudoephedrine.  You can also use Flonase 1-2 sprays in each nostril daily. Afrin is also a good option, if you do not have high blood pressure.    It is important to stay hydrated: drink plenty of fluids (water, gatorade/powerade/pedialyte, juices, or teas) to keep your throat moisturized and help further relieve irritation/discomfort.    Return or go to the Emergency Department if symptoms worsen or do not improve in the next few days\     ED  Prescriptions     Medication Sig Dispense Auth. Provider   albuterol (VENTOLIN HFA) 108 (90 Base) MCG/ACT inhaler Inhale 2-4 puffs into the lungs every 6 (six) hours as needed. 8 g Lyndee Hensen, DO      PDMP not reviewed this encounter.   Lyndee Hensen,  DO 05/31/22 1436

## 2022-05-31 NOTE — ED Triage Notes (Signed)
Patient c/o cough, runny nose, and bodyaches that started 3 days ago.  Patient denies fever.

## 2022-05-31 NOTE — Discharge Instructions (Signed)
Your home test for COVID-19 was positive, meaning that you were infected with the novel coronavirus and could give the germ to others.  Please continue isolation at home for at least 5 days since the start of your symptoms. Once you complete your 5 day quarantine, you may return to normal activities as long as you've not had a fever for over 24 hours(without taking fever reducing medicine) and your symptoms are improving. Be sure to wear a mask until Day 11.   If your were prescribed medication. Stop by the pharmacy to pick it up.   Please continue good preventive care measures, including:  frequent hand-washing, avoid touching your face, cover coughs/sneezes, stay out of crowds and keep a 6 foot distance from others.  Go to the nearest hospital emergency room if fever/cough/breathlessness are severe or illness seems like a threat to life.  You can take Tylenol and/or Ibuprofen as needed for fever reduction and pain relief.    For cough: honey 1/2 to 1 teaspoon (you can dilute the honey in water or another fluid).  You can also use guaifenesin and dextromethorphan for cough. You can use a humidifier for chest congestion and cough.  If you don't have a humidifier, you can sit in the bathroom with the hot shower running.      For sore throat: try warm salt water gargles, Mucinex sore throat cough drops or cepacol lozenges, throat spray, warm tea or water with lemon/honey, popsicles or ice, or OTC cold relief medicine for throat discomfort. You can also purchase chloraseptic spray at the pharmacy or dollar store.   For congestion: take a daily anti-histamine like Zyrtec, Claritin, and a oral decongestant, such as pseudoephedrine.  You can also use Flonase 1-2 sprays in each nostril daily. Afrin is also a good option, if you do not have high blood pressure.    It is important to stay hydrated: drink plenty of fluids (water, gatorade/powerade/pedialyte, juices, or teas) to keep your throat moisturized and  help further relieve irritation/discomfort.    Return or go to the Emergency Department if symptoms worsen or do not improve in the next few days  

## 2022-06-27 NOTE — Progress Notes (Signed)
   Acute Office Visit  Subjective:     Patient ID: Dawn Werner, female    DOB: [birth year], 72 y.o.   MRN: [id number]  Chief Complaint  Patient presents with   Back Pain    Bilateral low back pain   Dysuria    HPI Patient is in today for concern for UTI. Dysuria for 1 week. Bilateral flank pain and low back pain for 1 month, progressively worsening.   URINARY SYMPTOMS Dysuria: no Urinary frequency: no Urgency: no Small volume voids: no Foul odor: no Hematuria: no Abdominal pain: yes Back pain: yes Suprapubic pain/pressure: yes Flank pain: yes Fever:  no Vomiting: no Previous urinary tract infection: yes June 2023 Sexual activity: No sexually active Vaginal discharge: yes - clear but more in volume Treatments attempted: none    Review of Systems  Constitutional:  Negative for chills and fever.  Genitourinary:  Positive for flank pain. Negative for dysuria, frequency, hematuria and urgency.  Musculoskeletal:  Positive for back pain.        Objective:    There were no vitals taken for this visit. BP Readings from Last 3 Encounters:  06/28/22 132/70  05/31/22 112/68  05/27/22 126/72   Wt Readings from Last 3 Encounters:  06/28/22 159 lb 12.8 oz (72.5 kg)  05/31/22 155 lb (70.3 kg)  05/27/22 156 lb (70.8 kg)      Physical Exam Constitutional:      Appearance: Normal appearance.  HENT:     Head: Normocephalic and atraumatic.  Eyes:     Conjunctiva/sclera: Conjunctivae normal.  Cardiovascular:     Rate and Rhythm: Normal rate and regular rhythm.  Pulmonary:     Effort: Pulmonary effort is normal.     Breath sounds: Normal breath sounds.  Abdominal:     Tenderness: There is no right CVA tenderness or left CVA tenderness.  Skin:    General: Skin is warm and dry.  Neurological:     General: No focal deficit present.     Mental Status: She is alert. Mental status is at baseline.  Psychiatric:        Mood and Affect: Mood normal.         Behavior: Behavior normal.     No results found for any visits on 06/28/22.      Assessment & Plan:   1. Dysuria/Bilateral low back pain, unspecified chronicity, unspecified whether sciatica present: UA in the office with large amount of leukocytes and moderate blood. Urine culture sent. No CVA tenderness on exam. Will go ahead and treat with Bactrim x 3 days.   - POCT Urinalysis Dipstick - Urine Culture - sulfamethoxazole-trimethoprim (BACTRIM DS) 800-160 MG tablet; Take 1 tablet by mouth 2 (two) times daily for 3 days.  Dispense: 6 tablet; Refill: 0  2. Vaginal discharge: Self swab today, had been treated with Flagyl over the summer, will check a swab today.   - Cervicovaginal ancillary only   Return if symptoms worsen or fail to improve.  Teodora Medici, DO

## 2022-06-28 ENCOUNTER — Other Ambulatory Visit (HOSPITAL_COMMUNITY)
Admission: RE | Admit: 2022-06-28 | Discharge: 2022-06-28 | Disposition: A | Discharge status: Home / Self Care | Payer: Medicare Other | Source: Ambulatory Visit | Attending: Internal Medicine | Admitting: Internal Medicine

## 2022-06-28 ENCOUNTER — Ambulatory Visit (INDEPENDENT_AMBULATORY_CARE_PROVIDER_SITE_OTHER): Payer: Medicare Other | Admitting: Internal Medicine

## 2022-06-28 ENCOUNTER — Encounter: Payer: Self-pay | Admitting: Internal Medicine

## 2022-06-28 VITALS — BP 132/70 | HR 81 | Temp 98.2°F | Resp 16 | Ht 63.0 in | Wt 159.8 lb

## 2022-06-28 DIAGNOSIS — M545 Low back pain, unspecified: Secondary | ICD-10-CM

## 2022-06-28 DIAGNOSIS — N760 Acute vaginitis: Secondary | ICD-10-CM

## 2022-06-28 DIAGNOSIS — N898 Other specified noninflammatory disorders of vagina: Secondary | ICD-10-CM

## 2022-06-28 DIAGNOSIS — R300 Dysuria: Secondary | ICD-10-CM

## 2022-06-28 DIAGNOSIS — B9689 Other specified bacterial agents as the cause of diseases classified elsewhere: Secondary | ICD-10-CM

## 2022-06-28 LAB — POCT URINALYSIS DIPSTICK
Bilirubin, UA: NEGATIVE
Glucose, UA: NEGATIVE
Ketones, UA: NEGATIVE
Nitrite, UA: NEGATIVE
Protein, UA: NEGATIVE
Spec Grav, UA: 1.02 (ref 1.010–1.025)
Urobilinogen, UA: 0.2 E.U./dL
pH, UA: 5 (ref 5.0–8.0)

## 2022-06-28 MED ORDER — SULFAMETHOXAZOLE-TRIMETHOPRIM 800-160 MG PO TABS: 1.0000 | ORAL_TABLET | Freq: Two times a day (BID) | ORAL | 0 refills | Status: AC

## 2022-06-29 LAB — URINE CULTURE
MICRO NUMBER:: 14449232
SPECIMEN QUALITY:: ADEQUATE

## 2022-07-02 ENCOUNTER — Ambulatory Visit: Payer: Self-pay | Admitting: *Deleted

## 2022-07-02 ENCOUNTER — Other Ambulatory Visit: Payer: Self-pay

## 2022-07-02 DIAGNOSIS — R300 Dysuria: Secondary | ICD-10-CM

## 2022-07-02 LAB — CERVICOVAGINAL ANCILLARY ONLY
Bacterial Vaginitis (gardnerella): POSITIVE — AB
Candida Glabrata: NEGATIVE
Candida Vaginitis: NEGATIVE
Chlamydia: NEGATIVE
Comment: NEGATIVE
Comment: NEGATIVE
Comment: NEGATIVE
Comment: NEGATIVE
Comment: NEGATIVE
Comment: NORMAL
Neisseria Gonorrhea: NEGATIVE
Trichomonas: NEGATIVE

## 2022-07-02 MED ORDER — METRONIDAZOLE 500 MG PO TABS: 500.0000 mg | ORAL_TABLET | Freq: Two times a day (BID) | ORAL | 0 refills | Status: AC

## 2022-07-02 NOTE — Addendum Note (Signed)
Addended by: Teodora Medici on: 07/02/2022 02:28 PM   Modules accepted: Orders

## 2022-07-02 NOTE — Telephone Encounter (Signed)
  Chief Complaint: continuing symptoms from Friday tretmenta Symptoms: lower back pain, abdominal discomfort- lower- vaginal discharge- clear Frequency: 2 weeks Pertinent Negatives: Patient denies fever, blood in urine Disposition: [] ED /[] Urgent Care (no appt availability in office) / [] Appointment(In office/virtual)/ []  [facility]/ [] Home Care/ [x] Refused Recommended Disposition /[] [facility]/ []  Follow-up with PCP Additional Notes: Patient is calling to report continuing back pain and "vaginal pain". She was seen Friday and given 3 days treatment- not better- she wants to know if she has vaginal infection- but those results are still listed as pending. Offer appointment- but patient doesn't want to come in.Message sent for office review and follow up.

## 2022-07-02 NOTE — Telephone Encounter (Signed)
Reason for Disposition  Unusual vaginal discharge (e.g., bad smelling, yellow, green, or foamy-white)  Answer Assessment - Initial Assessment Questions 1. ONSET: "When did the pain begin?"      2 weeks ago- was seen last week 2. LOCATION: "Where does it hurt?" (upper, mid or lower back)     Lower back, vaginal pain 3. SEVERITY: "How bad is the pain?"  (e.g., Scale 1-10; mild, moderate, or severe)   - MILD (1-3): Doesn't interfere with normal activities.    - MODERATE (4-7): Interferes with normal activities or awakens from sleep.    - SEVERE (8-10): Excruciating pain, unable to do any normal activities.      Aching pain, moderate 4. PATTERN: "Is the pain constant?" (e.g., yes, no; constant, intermittent)      constant 5. RADIATION: "Does the pain shoot into your legs or somewhere else?"     Radiates to front 6. CAUSE:  "What do you think is causing the back pain?"      Unsure- finished 3 days antibiotic  9. NEUROLOGIC SYMPTOMS: "Do you have any weakness, numbness, or problems with bowel/bladder control?"     no 10. OTHER SYMPTOMS: "Do you have any other symptoms?" (e.g., fever, abdomen pain, burning with urination, blood in urine)       Abdominal pain  Answer Assessment - Initial Assessment Questions 1. LOCATION: "Where does it hurt?"      Lower abdominal pain 2. RADIATION: "Does the pain shoot anywhere else?" (e.g., chest, back)     Into vagina 3. ONSET: "When did the pain begin?" (e.g., minutes, hours or days ago)      2 weeks 4. SUDDEN: "Gradual or sudden onset?"     gradual 5. PATTERN "Does the pain come and go, or is it constant?"    - If it comes and goes: "How long does it last?" "Do you have pain now?"     (Note: Comes and goes means the pain is intermittent. It goes away completely between bouts.)    - If constant: "Is it getting better, staying the same, or getting worse?"      (Note: Constant means the pain never goes away completely; most serious pain is constant and  gets worse.)      constant 6. SEVERITY: "How bad is the pain?"  (e.g., Scale 1-10; mild, moderate, or severe)    - MILD (1-3): Doesn't interfere with normal activities, abdomen soft and not tender to touch.     - MODERATE (4-7): Interferes with normal activities or awakens from sleep, abdomen tender to touch.     - SEVERE (8-10): Excruciating pain, doubled over, unable to do any normal activities.       moderate 7. RECURRENT SYMPTOM: "Have you ever had this type of stomach pain before?" If Yes, ask: "When was the last time?" and "What happened that time?"      Yes- treated for vaginal infection 8. CAUSE: "What do you think is causing the stomach pain?"     unsure  Protocols used: Back Pain-A-AH, Abdominal Pain - Female-A-AH

## 2022-07-02 NOTE — Telephone Encounter (Signed)
Pt saw Dr Rosana Berger on 06/28/22

## 2022-08-26 ENCOUNTER — Other Ambulatory Visit: Payer: Self-pay | Admitting: Family Medicine

## 2022-08-26 DIAGNOSIS — K219 Gastro-esophageal reflux disease without esophagitis: Secondary | ICD-10-CM

## 2022-08-26 DIAGNOSIS — I252 Old myocardial infarction: Secondary | ICD-10-CM

## 2022-08-26 DIAGNOSIS — I10 Essential (primary) hypertension: Secondary | ICD-10-CM

## 2022-08-26 DIAGNOSIS — E785 Hyperlipidemia, unspecified: Secondary | ICD-10-CM

## 2022-08-26 DIAGNOSIS — E1129 Type 2 diabetes mellitus with other diabetic kidney complication: Secondary | ICD-10-CM

## 2022-09-23 ENCOUNTER — Other Ambulatory Visit: Payer: Self-pay | Admitting: Family Medicine

## 2022-09-23 DIAGNOSIS — F330 Major depressive disorder, recurrent, mild: Secondary | ICD-10-CM

## 2022-09-26 NOTE — Progress Notes (Signed)
Name: Dawn Werner   MRN: [id number]    DOB: 1951/04/01   Date:09/27/2022       Progress Note  Subjective  Chief Complaint  Follow Up  HPI  MDD  in remission : she has been married for the past 52 years, however over the past 10 years when they became members of a larger church. He is a deacon and he likes being the center or attention. He is a Haematologist , and about 15 years ago she cheated on her. She was crying daily and lost of weight. She was not feeling appreciated, he did  not want to talk about it . Since she started on Celexa they have been communicating better, she has been more patient and dealing with stress more   Shingles on left buttocks: she has a history of shingles on ophthalmic area years ago. She has been under more stress due to husband being hospitalized and diagnosed with CHF, she developed a crop of blister a few days ago, it was very painful yesterday, starting to dry out   Chronic low back pain with radiculitis: she was seen by Ortho, had left trochanteric bursa injection and has been doing well since, still takes Lyrica usually mid day, tolerating it well and helps with pain . She takes it prn   Left cervical radiculitis: she has noticed pain that goes from left side of the neck to lateral left arm, she takes medication prn, discussed it helps if daily, she states neck is good usually takes it for left hip pain and needs a refill today   DMII:  Her  hgbA1C went down from 7.8%  to 7.3%, up to 7.4%,  7.3% ,6.9%, 6.8%, 7.5% 6.8% down to 6.5% , 6.4 % 7.1%,down to 6.9 % ,7 % 6.8 % 6.3 % past two times it has been 6.6 %   She has history of nephropathy and is on ARB .She denies polyphagia, polydipsia or polyuria. She is taking Metformin ER to 750 mg ER and denies side effects of medications we will check B12 level    OA both knees and left hip: she states symptoms improved, seen by Ortho in the past , she is active , she has a rose garden and is active Stable    Hyperlipidemia: she is back on Crestor 40 mg and Zetia 10 mg daily Denies myalgias Last LDL at goal at 66, we will recheck labs today   HTN:  No chest pain, palpitation or SOB , taking medication as prescribed. BP is at good . Continue current regiment   CAD: no episodes, s/p history of MI in 2009, taking aspirin daily,ARB,  beta-blocker and statin therapy. No longer seeing a cardiologist. Compliant with medications and denies chest pain or decrease in exercise tolerance Unchanged    Asthma Mild Intermittent: she has been doing well, no cough, wheezing or SOB lately, she has to use Breo daily during the month of March due to high pollen count , she is currently only using rescue prn Stable   Atherosclerosis of aorta: on statin, aspirin  81 mg daily  last LDL at goal.  We will recheck labs    GERD: doing well on Omeprazole prn when triggered by certain types of food Unchanged   Insomnia: she had weaned self off but in March she resumed taking it.  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   Primary osteoarthritis of knee 01/24/2015   Asthma, mild intermittent 11/18/2014   Arteriosclerosis of coronary artery 11/18/2014   Insomnia, persistent 11/18/2014   Dyslipidemia 11/18/2014   Acid reflux 11/18/2014   Diabetes type 2, controlled 11/18/2014   Genital herpes in women 11/18/2014   Hypertension, benign 11/18/2014   Asymptomatic postmenopausal  status 11/18/2014   History of acute myocardial infarction 11/18/2014    Past Surgical History:  Procedure Laterality Date   ABDOMINAL HYSTERECTOMY     BREAST SURGERY     breast reduction   REDUCTION MAMMAPLASTY Bilateral 1995   TUBAL LIGATION      Family History  Problem Relation Age of Onset   Hyperlipidemia Mother    Hypertension Mother    Stroke Mother    Dementia Father    Cancer Sister        breast   Breast cancer Sister 2   Asthma Brother    Depression Brother        bipolar   Heart disease Brother    Heart disease Brother     Social History   Tobacco Use   Smoking status: Never   Smokeless tobacco: Never  Substance Use Topics   Alcohol use: No    Alcohol/week: 0.0 standard drinks of alcohol     Current Outpatient Medications:    albuterol (VENTOLIN HFA) 108 (90 Base) MCG/ACT inhaler, Inhale 2-4 puffs into the lungs every 6 (six) hours as needed., Disp: 8 g, Rfl: 0   Blood Glucose Monitoring Suppl (ACCU-CHEK GUIDE ME) w/Device KIT, USE AS DIRECTED, Disp: 1 kit, Rfl: 0   carvedilol (COREG) 3.125 MG tablet, TAKE 1 TABLET(3.125 MG) BY MOUTH TWICE DAILY, Disp: 180 tablet, Rfl: 1   citalopram (CELEXA) 20 MG tablet, TAKE 1 TABLET(20 MG) BY MOUTH DAILY, Disp: 90 tablet, Rfl: 0   ezetimibe (ZETIA) 10 MG tablet, Take 1 tablet (10 mg total) by mouth daily., Disp: 90 tablet, Rfl: 1   losartan (COZAAR) 25 MG tablet, TAKE 1 TABLET(25 MG) BY MOUTH DAILY, Disp: 90 tablet, Rfl: 1   metFORMIN (GLUCOPHAGE-XR) 750 MG 24 hr tablet, TAKE 1 TABLET BY MOUTH EVERY DAY WITH BREAKFAST, Disp: 90 tablet, Rfl: 1   omeprazole (PRILOSEC) 40 MG capsule, TAKE 1 CAPSULE(40 MG) BY MOUTH DAILY, Disp: 90 capsule, Rfl: 1   pregabalin (LYRICA) 50 MG capsule, Take 1 capsule (50 mg total) by mouth 2 (two) times daily., Disp: 180 capsule, Rfl: 0   rosuvastatin (CRESTOR) 40 MG tablet, TAKE 1 TABLET(40 MG) BY MOUTH DAILY, Disp: 90 tablet, Rfl: 1   tiZANidine (ZANAFLEX) 2 MG tablet, Take 1 tablet (2  mg total) by mouth every 6 (six) hours as needed for muscle spasms., Disp: 30 tablet, Rfl: 0   zolpidem (AMBIEN) 10 MG tablet, Take 1 tablet (10 mg total) by mouth at bedtime., Disp: 90 tablet, Rfl: 0  Allergies  Allergen Reactions   Metformin And Related Diarrhea    And nausea. Tolerates XR formulation    I personally reviewed active problem list, medication list, allergies, family history, social  history, health maintenance with the patient/caregiver today.   ROS  Ten systems reviewed and is negative except as mentioned in HPI   Objective  Vitals:   09/27/22 0857  BP: 122/68  Pulse: 81  Resp: 16  SpO2: 96%  Weight: 157 lb (71.2 kg)  Height:  (1.6 m)    Body mass index is 27.81 kg/m.  Physical Exam  Constitutional: Patient appears well-developed and well-nourished. No distress.  HEENT: head atraumatic, normocephalic, pupils equal and reactive to light, ears , neck supple, throat within normal limits Cardiovascular: Normal rate, regular rhythm and normal heart sounds.  No murmur heard. No BLE edema. Pulmonary/Chest: Effort normal and breath sounds normal. No respiratory distress. Abdominal: Soft.  There is no tenderness. Rash: dark area with erythematous rash around the site on left buttocks.  Psychiatric: Patient has a normal mood and affect. behavior is normal. Judgment and thought content normal.   Recent Results (from the past 2160 hour(s))  POCT HgB A1C     Status: Abnormal   Collection Time: 09/27/22  9:01 AM  Result Value Ref Range   Hemoglobin A1C 6.6 (A) 4.0 - 5.6 %   HbA1c POC (<> result, manual entry)     HbA1c, POC (prediabetic range)     HbA1c, POC (controlled diabetic range)       PHQ2/9:    09/27/2022    8:58 AM 06/28/2022    2:38 PM 05/27/2022   10:59 AM 01/25/2022   11:22 AM 12/13/2021   10:17 AM  Depression screen PHQ 2/9  Decreased Interest 0 0 0 0 1  Down, Depressed, Hopeless 0 0 0 0 1  PHQ - 2 Score 0 0 0 0 2  Altered sleeping 0 0 1  0 0  Tired, decreased energy 0 0 0 0 0  Change in appetite 0 0 0 0 1  Feeling bad or failure about yourself  0 0 0 0 0  Trouble concentrating 0 0 0 0 0  Moving slowly or fidgety/restless 0 0 0 0 0  Suicidal thoughts 0 0 0 0 0  PHQ-9 Score 0 0 1 0 3  Difficult doing work/chores  Not difficult at all       phq 9 is negative   Fall Risk:    09/27/2022    8:58 AM 06/28/2022    2:38 PM 05/27/2022   10:59 AM 01/25/2022   11:21 AM 12/13/2021   10:16 AM  Fall Risk   Falls in the past year? 0 1 1 0 0  Number falls in past yr: 0 0 0  0  Injury with Fall? 0 1 0  0  Risk for fall due to : No Fall Risks Impaired balance/gait No Fall Risks  No Fall Risks  Follow up Falls prevention discussed Falls prevention discussed;Education provided;Falls evaluation completed Falls prevention discussed Falls prevention discussed Falls prevention discussed      Functional Status Survey: Is the patient deaf or have difficulty hearing?: No Does the patient have difficulty seeing, even when wearing glasses/contacts?: No Does the patient have difficulty concentrating, remembering, or making decisions?: No Does the patient have difficulty walking or climbing stairs?: No Does the patient have difficulty dressing or bathing?: No Does the patient have difficulty doing errands alone such as visiting a doctor's office or shopping?: No    Assessment & Plan  1. Type 2 diabetes mellitus with microalbuminuria, without long-term current use of insulin  - POCT HgB A1C - COMPLETE METABOLIC PANEL WITH  GFR - Urine Microalbumin w/creat. ratio - Lipid panel  2. Dyslipidemia associated with type 2 diabetes mellitus  - Lipid panel  3. Dyslipidemia  - ezetimibe (ZETIA) 10 MG tablet; Take 1 tablet (10 mg total) by mouth daily.  Dispense: 90 tablet; Refill: 1  4. Major depressive disorder with single episode, in full remission  - citalopram (CELEXA) 20 MG tablet; Take 1 tablet (20 mg total) by mouth daily.   Dispense: 90 tablet; Refill: 0  5. Insomnia, persistent  - zolpidem (AMBIEN) 10 MG tablet; Take 1 tablet (10 mg total) by mouth at bedtime.  Dispense: 90 tablet; Refill: 0  She has been taking one third most night and needs a refill  6. Gastroesophageal reflux disease without esophagitis  Controlled   7. Essential hypertension  BP is at goal   8. Arteriosclerosis of coronary artery  - ezetimibe (ZETIA) 10 MG tablet; Take 1 tablet (10 mg total) by mouth daily.  Dispense: 90 tablet; Refill: 1  9. Long-term use of high-risk medication  - CBC with Differential/Platelet - B12 and Folate Panel  10. Herpes zoster without complication  - valACYclovir (VALTREX) 1000 MG tablet; Take 1 tablet (1,000 mg total) by mouth 3 (three) times daily for 10 days.  Dispense: 21 tablet; Refill: 0

## 2022-09-27 ENCOUNTER — Ambulatory Visit (INDEPENDENT_AMBULATORY_CARE_PROVIDER_SITE_OTHER): Payer: Medicare Other | Admitting: Family Medicine

## 2022-09-27 ENCOUNTER — Encounter: Payer: Self-pay | Admitting: Family Medicine

## 2022-09-27 VITALS — BP 122/68 | HR 81 | Resp 16 | Ht 63.0 in | Wt 157.0 lb

## 2022-09-27 DIAGNOSIS — G4700 Insomnia, unspecified: Secondary | ICD-10-CM | POA: Diagnosis not present

## 2022-09-27 DIAGNOSIS — B029 Zoster without complications: Secondary | ICD-10-CM | POA: Diagnosis not present

## 2022-09-27 DIAGNOSIS — M5416 Radiculopathy, lumbar region: Secondary | ICD-10-CM

## 2022-09-27 DIAGNOSIS — E785 Hyperlipidemia, unspecified: Secondary | ICD-10-CM

## 2022-09-27 DIAGNOSIS — E1169 Type 2 diabetes mellitus with other specified complication: Secondary | ICD-10-CM | POA: Diagnosis not present

## 2022-09-27 DIAGNOSIS — K219 Gastro-esophageal reflux disease without esophagitis: Secondary | ICD-10-CM | POA: Diagnosis not present

## 2022-09-27 DIAGNOSIS — E1129 Type 2 diabetes mellitus with other diabetic kidney complication: Secondary | ICD-10-CM

## 2022-09-27 DIAGNOSIS — I2510 Atherosclerotic heart disease of native coronary artery without angina pectoris: Secondary | ICD-10-CM

## 2022-09-27 DIAGNOSIS — M5412 Radiculopathy, cervical region: Secondary | ICD-10-CM

## 2022-09-27 DIAGNOSIS — Z79899 Other long term (current) drug therapy: Secondary | ICD-10-CM | POA: Diagnosis not present

## 2022-09-27 DIAGNOSIS — I10 Essential (primary) hypertension: Secondary | ICD-10-CM

## 2022-09-27 DIAGNOSIS — F325 Major depressive disorder, single episode, in full remission: Secondary | ICD-10-CM

## 2022-09-27 DIAGNOSIS — R809 Proteinuria, unspecified: Secondary | ICD-10-CM | POA: Diagnosis not present

## 2022-09-27 LAB — POCT GLYCOSYLATED HEMOGLOBIN (HGB A1C): Hemoglobin A1C: 6.6 % — AB (ref 4.0–5.6)

## 2022-09-27 MED ORDER — EZETIMIBE 10 MG PO TABS: 10.0000 mg | ORAL_TABLET | Freq: Every day | ORAL | 1 refills | Status: DC

## 2022-09-27 MED ORDER — CITALOPRAM HYDROBROMIDE 20 MG PO TABS: 20.0000 mg | ORAL_TABLET | Freq: Every day | ORAL | 0 refills | Status: DC

## 2022-09-27 MED ORDER — PREGABALIN 50 MG PO CAPS: 50.0000 mg | ORAL_CAPSULE | Freq: Two times a day (BID) | ORAL | 0 refills | Status: DC

## 2022-09-27 MED ORDER — VALACYCLOVIR HCL 1 G PO TABS: 1000.0000 mg | ORAL_TABLET | Freq: Three times a day (TID) | ORAL | 0 refills | Status: AC

## 2022-09-27 MED ORDER — ZOLPIDEM TARTRATE 10 MG PO TABS: 10.0000 mg | ORAL_TABLET | Freq: Every day | ORAL | 0 refills | Status: DC

## 2022-09-28 LAB — CBC WITH DIFFERENTIAL/PLATELET
Absolute Monocytes: 712 cells/uL (ref 200–950)
Basophils Absolute: 72 cells/uL (ref 0–200)
Basophils Relative: 0.9 %
Eosinophils Absolute: 80 cells/uL (ref 15–500)
Eosinophils Relative: 1 %
HCT: 41.9 % (ref 35.0–45.0)
Hemoglobin: 13.7 g/dL (ref 11.7–15.5)
Lymphs Abs: 2080 cells/uL (ref 850–3900)
MCH: 28.3 pg (ref 27.0–33.0)
MCHC: 32.7 g/dL (ref 32.0–36.0)
MCV: 86.6 fL (ref 80.0–100.0)
MPV: 10.9 fL (ref 7.5–12.5)
Monocytes Relative: 8.9 %
Neutro Abs: 5056 cells/uL (ref 1500–7800)
Neutrophils Relative %: 63.2 %
Platelets: 264 10*3/uL (ref 140–400)
RBC: 4.84 10*6/uL (ref 3.80–5.10)
RDW: 11.6 % (ref 11.0–15.0)
Total Lymphocyte: 26 %
WBC: 8 10*3/uL (ref 3.8–10.8)

## 2022-09-28 LAB — COMPLETE METABOLIC PANEL WITH GFR
AG Ratio: 1.8 (calc) (ref 1.0–2.5)
ALT: 18 U/L (ref 6–29)
AST: 19 U/L (ref 10–35)
Albumin: 4.6 g/dL (ref 3.6–5.1)
Alkaline phosphatase (APISO): 74 U/L (ref 37–153)
BUN: 10 mg/dL (ref 7–25)
CO2: 25 mmol/L (ref 20–32)
Calcium: 9.8 mg/dL (ref 8.6–10.4)
Chloride: 101 mmol/L (ref 98–110)
Creat: 0.74 mg/dL (ref 0.60–1.00)
Globulin: 2.5 g/dL (calc) (ref 1.9–3.7)
Glucose, Bld: 119 mg/dL — ABNORMAL HIGH (ref 65–99)
Potassium: 4.4 mmol/L (ref 3.5–5.3)
Sodium: 139 mmol/L (ref 135–146)
Total Bilirubin: 0.5 mg/dL (ref 0.2–1.2)
Total Protein: 7.1 g/dL (ref 6.1–8.1)
eGFR: 86 mL/min/{1.73_m2} (ref >=60)

## 2022-09-28 LAB — MICROALBUMIN / CREATININE URINE RATIO
Creatinine, Urine: 171 mg/dL (ref 20–275)
Microalb Creat Ratio: 101 mg/g creat — ABNORMAL HIGH (ref <30)
Microalb, Ur: 17.3 mg/dL

## 2022-09-28 LAB — LIPID PANEL
Cholesterol: 121 mg/dL (ref <200)
HDL: 39 mg/dL — ABNORMAL LOW (ref >=50)
LDL Cholesterol (Calc): 61 mg/dL (calc)
Non-HDL Cholesterol (Calc): 82 mg/dL (calc) (ref <130)
Total CHOL/HDL Ratio: 3.1 (calc) (ref <5.0)
Triglycerides: 126 mg/dL (ref <150)

## 2022-09-28 LAB — B12 AND FOLATE PANEL
Folate: 14.8 ng/mL
Vitamin B-12: 415 pg/mL (ref 200–1100)

## 2022-10-17 LAB — HEMOGLOBIN A1C: Hemoglobin A1C: 6.4

## 2022-10-18 DIAGNOSIS — E119 Type 2 diabetes mellitus without complications: Secondary | ICD-10-CM | POA: Diagnosis not present

## 2022-10-18 DIAGNOSIS — H2513 Age-related nuclear cataract, bilateral: Secondary | ICD-10-CM | POA: Diagnosis not present

## 2022-10-18 LAB — HM DIABETES EYE EXAM

## 2022-11-28 ENCOUNTER — Ambulatory Visit (INDEPENDENT_AMBULATORY_CARE_PROVIDER_SITE_OTHER): Payer: Medicare Other

## 2022-11-28 VITALS — Ht 63.0 in | Wt 157.0 lb

## 2022-11-28 DIAGNOSIS — Z0000 Encounter for general adult medical examination without abnormal findings: Secondary | ICD-10-CM

## 2022-11-28 NOTE — Patient Instructions (Signed)
Dawn Werner , Thank you for taking time to come for your Medicare Wellness Visit. I appreciate your ongoing commitment to your health goals. Please review the following plan we discussed and let me know if I can assist you in the future.   These are the goals we discussed:  Goals      DIET - INCREASE WATER INTAKE     Recommend to drink at least 6-8 8oz glasses of water per day.     maintain weight     Patient would like to maintain her weight to be less than 160#        This is a list of the screening recommended for you and due dates:  Health Maintenance  Topic Date Due   COVID-19 Vaccine (5 - 2023-24 season) 06/17/2022   Zoster (Shingles) Vaccine (2 of 2) 12/26/2022*   Flu Shot  01/09/2023   Hemoglobin A1C  04/19/2023   Mammogram  05/25/2023   Complete foot exam   05/28/2023   Colon Cancer Screening  06/19/2023   Yearly kidney function blood test for diabetes  09/27/2023   Yearly kidney health urinalysis for diabetes  09/27/2023   Eye exam for diabetics  10/18/2023   Medicare Annual Wellness Visit  11/28/2023   DTaP/Tdap/Td vaccine (4 - Td or Tdap) 01/30/2027   Pneumonia Vaccine  Completed   DEXA scan (bone density measurement)  Completed   Hepatitis C Screening  Completed   HPV Vaccine  Aged Out  *Topic was postponed. The date shown is not the original due date.    Advanced directives: no  Conditions/risks identified: low falls risk  Next appointment: Follow up in one year for your annual wellness visit 12/04/2023 @ 9:15am telephone   Preventive Care 65 Years and Older, Female Preventive care refers to lifestyle choices and visits with your health care provider that can promote health and wellness. What does preventive care include? A yearly physical exam. This is also called an annual well check. Dental exams once or twice a year. Routine eye exams. Ask your health care provider how often you should have your eyes checked. Personal lifestyle choices,  including: Daily care of your teeth and gums. Regular physical activity. Eating a healthy diet. Avoiding tobacco and drug use. Limiting alcohol use. Practicing safe sex. Taking low-dose aspirin every day. Taking vitamin and mineral supplements as recommended by your health care provider. What happens during an annual well check? The services and screenings done by your health care provider during your annual well check will depend on your age, overall health, lifestyle risk factors, and family history of disease. Counseling  Your health care provider may ask you questions about your: Alcohol use. Tobacco use. Drug use. Emotional well-being. Home and relationship well-being. Sexual activity. Eating habits. History of falls. Memory and ability to understand (cognition). Work and work Astronomer. Reproductive health. Screening  You may have the following tests or measurements: Height, weight, and BMI. Blood pressure. Lipid and cholesterol levels. These may be checked every 5 years, or more frequently if you are over 31 years old. Skin check. Lung cancer screening. You may have this screening every year starting at age 35 if you have a 30-pack-year history of smoking and currently smoke or have quit within the past 15 years. Fecal occult blood test (FOBT) of the stool. You may have this test every year starting at age 75. Flexible sigmoidoscopy or colonoscopy. You may have a sigmoidoscopy every 5 years or a colonoscopy every 10 years  starting at age 66. Hepatitis C blood test. Hepatitis B blood test. Sexually transmitted disease (STD) testing. Diabetes screening. This is done by checking your blood sugar (glucose) after you have not eaten for a while (fasting). You may have this done every 1-3 years. Bone density scan. This is done to screen for osteoporosis. You may have this done starting at age 55. Mammogram. This may be done every 1-2 years. Talk to your health care provider  about how often you should have regular mammograms. Talk with your health care provider about your test results, treatment options, and if necessary, the need for more tests. Vaccines  Your health care provider may recommend certain vaccines, such as: Influenza vaccine. This is recommended every year. Tetanus, diphtheria, and acellular pertussis (Tdap, Td) vaccine. You may need a Td booster every 10 years. Zoster vaccine. You may need this after age 80. Pneumococcal 13-valent conjugate (PCV13) vaccine. One dose is recommended after age 69. Pneumococcal polysaccharide (PPSV23) vaccine. One dose is recommended after age 55. Talk to your health care provider about which screenings and vaccines you need and how often you need them. This information is not intended to replace advice given to you by your health care provider. Make sure you discuss any questions you have with your health care provider. Document Released: 06/23/2015 Document Revised: 02/14/2016 Document Reviewed: 03/28/2015 Elsevier Interactive Patient Education  2017 [place] Prevention in the Home Falls can cause injuries. They can happen to people of all ages. There are many things you can do to make your home safe and to help prevent falls. What can I do on the outside of my home? Regularly fix the edges of walkways and driveways and fix any cracks. Remove anything that might make you trip as you walk through a door, such as a raised step or threshold. Trim any bushes or trees on the path to your home. Use bright outdoor lighting. Clear any walking paths of anything that might make someone trip, such as rocks or tools. Regularly check to see if handrails are loose or broken. Make sure that both sides of any steps have handrails. Any raised decks and porches should have guardrails on the edges. Have any leaves, snow, or ice cleared regularly. Use sand or salt on walking paths during winter. Clean up any spills in  your garage right away. This includes oil or grease spills. What can I do in the bathroom? Use night lights. Install grab bars by the toilet and in the tub and shower. Do not use towel bars as grab bars. Use non-skid mats or decals in the tub or shower. If you need to sit down in the shower, use a plastic, non-slip stool. Keep the floor dry. Clean up any water that spills on the floor as soon as it happens. Remove soap buildup in the tub or shower regularly. Attach bath mats securely with double-sided non-slip rug tape. Do not have throw rugs and other things on the floor that can make you trip. What can I do in the bedroom? Use night lights. Make sure that you have a light by your bed that is easy to reach. Do not use any sheets or blankets that are too big for your bed. They should not hang down onto the floor. Have a firm chair that has side arms. You can use this for support while you get dressed. Do not have throw rugs and other things on the floor that can make you trip. What  can I do in the kitchen? Clean up any spills right away. Avoid walking on wet floors. Keep items that you use a lot in easy-to-reach places. If you need to reach something above you, use a strong step stool that has a grab bar. Keep electrical cords out of the way. Do not use floor polish or wax that makes floors slippery. If you must use wax, use non-skid floor wax. Do not have throw rugs and other things on the floor that can make you trip. What can I do with my stairs? Do not leave any items on the stairs. Make sure that there are handrails on both sides of the stairs and use them. Fix handrails that are broken or loose. Make sure that handrails are as long as the stairways. Check any carpeting to make sure that it is firmly attached to the stairs. Fix any carpet that is loose or worn. Avoid having throw rugs at the top or bottom of the stairs. If you do have throw rugs, attach them to the floor with carpet  tape. Make sure that you have a light switch at the top of the stairs and the bottom of the stairs. If you do not have them, ask someone to add them for you. What else can I do to help prevent falls? Wear shoes that: Do not have high heels. Have rubber bottoms. Are comfortable and fit you well. Are closed at the toe. Do not wear sandals. If you use a stepladder: Make sure that it is fully opened. Do not climb a closed stepladder. Make sure that both sides of the stepladder are locked into place. Ask someone to hold it for you, if possible. Clearly mark and make sure that you can see: Any grab bars or handrails. First and last steps. Where the edge of each step is. Use tools that help you move around (mobility aids) if they are needed. These include: Canes. Walkers. Scooters. Crutches. Turn on the lights when you go into a dark area. Replace any light bulbs as soon as they burn out. Set up your furniture so you have a clear path. Avoid moving your furniture around. If any of your floors are uneven, fix them. If there are any pets around you, be aware of where they are. Review your medicines with your doctor. Some medicines can make you feel dizzy. This can increase your chance of falling. Ask your doctor what other things that you can do to help prevent falls. This information is not intended to replace advice given to you by your health care provider. Make sure you discuss any questions you have with your health care provider. Document Released: 03/23/2009 Document Revised: 11/02/2015 Document Reviewed: 07/01/2014 Elsevier Interactive Patient Education  2017 [employer].

## 2022-11-28 NOTE — Progress Notes (Signed)
Subjective:   Dawn Werner is a 72 y.o. female who presents for Medicare Annual (Subsequent) preventive examination.  Visit Complete: Virtual  I connected with  Dawn Werner on 11/28/22 by a audio enabled telemedicine application and verified that I am speaking with the correct person using two identifiers.  Patient Location: Home  Provider Location: Office/Clinic  I discussed the limitations of evaluation and management by telemedicine. The patient expressed understanding and agreed to proceed.  Patient Medicare AWV questionnaire was completed by the patient on (not done); I have confirmed that all information answered by patient is correct and no changes since this date.  Review of Systems    Cardiac Risk Factors include: advanced age (>62men, >62 women);diabetes mellitus;dyslipidemia;hypertension     Objective:    Today's Vitals   11/28/22 0918 11/28/22 0919  Weight: 157 lb (71.2 kg)   Height: 5\' 3"  (1.6 m)   PainSc:  3    Body mass index is 27.81 kg/m.     11/28/2022    9:30 AM 05/31/2022   10:47 AM 11/20/2021   11:33 AM 03/02/2021   11:19 AM 11/07/2020    3:49 PM 10/19/2019    8:50 AM 04/24/2019   11:03 AM  Advanced Directives  Does Patient Have a Medical Advance Directive? No No No No No No No  Would patient like information on creating a medical advance directive?   No - Patient declined  No - Patient declined No - Patient declined     Current Medications (verified) Outpatient Encounter Medications as of 11/28/2022  Medication Sig   albuterol (VENTOLIN HFA) 108 (90 Base) MCG/ACT inhaler Inhale 2-4 puffs into the lungs every 6 (six) hours as needed.   Blood Glucose Monitoring Suppl (ACCU-CHEK GUIDE ME) w/Device KIT USE AS DIRECTED   carvedilol (COREG) 3.125 MG tablet TAKE 1 TABLET(3.125 MG) BY MOUTH TWICE DAILY   citalopram (CELEXA) 20 MG tablet Take 1 tablet (20 mg total) by mouth daily.   ezetimibe (ZETIA) 10 MG tablet Take 1 tablet (10 mg total) by  mouth daily.   losartan (COZAAR) 25 MG tablet TAKE 1 TABLET(25 MG) BY MOUTH DAILY   metFORMIN (GLUCOPHAGE-XR) 750 MG 24 hr tablet TAKE 1 TABLET BY MOUTH EVERY DAY WITH BREAKFAST   omeprazole (PRILOSEC) 40 MG capsule TAKE 1 CAPSULE(40 MG) BY MOUTH DAILY   pregabalin (LYRICA) 50 MG capsule Take 1 capsule (50 mg total) by mouth 2 (two) times daily.   rosuvastatin (CRESTOR) 40 MG tablet TAKE 1 TABLET(40 MG) BY MOUTH DAILY   tiZANidine (ZANAFLEX) 2 MG tablet Take 1 tablet (2 mg total) by mouth every 6 (six) hours as needed for muscle spasms.   zolpidem (AMBIEN) 10 MG tablet Take 1 tablet (10 mg total) by mouth at bedtime.   No facility-administered encounter medications on file as of 11/28/2022.    Allergies (verified) Metformin and related   History: Past Medical History:  Diagnosis Date   Anxiety    Asthma    Diabetes mellitus without complication (HCC)    GERD (gastroesophageal reflux disease)    Hyperlipidemia    Insomnia    Past Surgical History:  Procedure Laterality Date   ABDOMINAL HYSTERECTOMY     BREAST SURGERY     breast reduction   REDUCTION MAMMAPLASTY Bilateral 1995   TUBAL LIGATION     Family History  Problem Relation Age of Onset   Hyperlipidemia Mother    Hypertension Mother    Stroke Mother    Dementia  Father    Cancer Sister        breast   Breast cancer Sister 83   Asthma Brother    Depression Brother        bipolar   Heart disease Brother    Heart disease Brother    Social History   Socioeconomic History   Marital status: Married    Spouse name: [name]   Number of children: 2   Years of education: Not on file   Highest education level: 12th grade  Occupational History   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  Tobacco Use   Smoking status: Never   Smokeless tobacco: Never  Vaping Use   Vaping Use: Never used  Substance and Sexual Activity   Alcohol use: No    Alcohol/week: 0.0 standard drinks of alcohol   Drug use: No   Sexual  activity: Yes    Partners: Male  Other Topics Concern   Not on file  Social History Narrative   Not on file   Social Determinants of Health   Financial Resource Strain: Low Risk  (11/28/2022)   Overall Financial Resource Strain (CARDIA)    Difficulty of Paying Living Expenses: Not hard at all  Food Insecurity: No Food Insecurity (11/28/2022)   Hunger Vital Sign    Worried About Running Out of Food in the Last Year: Never true    Ran Out of Food in the Last Year: Never true  Transportation Needs: No Transportation Needs (11/28/2022)   PRAPARE - Administrator, Civil Service (Medical): No    Lack of Transportation (Non-Medical): No  Physical Activity: Sufficiently Active (11/28/2022)   Exercise Vital Sign    Days of Exercise per Week: 7 days    Minutes of Exercise per Session: 30 min  Stress: No Stress Concern Present (11/28/2022)   [employer] of Occupational Health - Occupational Stress Questionnaire    Feeling of Stress : Not at all  Social Connections: Socially Integrated (11/28/2022)   Social Connection and Isolation Panel [NHANES]    Frequency of Communication with Friends and Family: More than three times a week    Frequency of Social Gatherings with Friends and Family: More than three times a week    Attends Religious Services: More than 4 times per year    Active Member of [employer] or Organizations: Yes    Attends Engineer, structural: More than 4 times per year    Marital Status: Married    Tobacco Counseling Counseling given: Not Answered  Clinical Intake:  Pre-visit preparation completed: Yes  Pain : 0-10 Pain Score: 3  Pain Type: Chronic pain Pain Location: Hip (left knee) Pain Orientation: Left Pain Descriptors / Indicators: Aching Pain Onset: More than a month ago Pain Frequency: Intermittent Pain Relieving Factors: rest, tylenol  Pain Relieving Factors: rest, tylenol  BMI - recorded: 27.81 Nutritional Status: BMI 25 -29  Overweight Nutritional Risks: None Diabetes: Yes CBG done?: No Did pt. bring in CBG monitor from home?: No  How often do you need to have someone help you when you read instructions, pamphlets, or other written materials from your doctor or pharmacy?: 1 - Never  Interpreter Needed?: No  Comments: lives with husband Information entered by :: B.Shakeera Rightmyer,LPN   Activities of Daily Living    11/28/2022    9:30 AM 09/27/2022    8:58 AM  In your present state of health, do you have any difficulty performing the following activities:  Hearing?  0 0  Vision? 0 0  Difficulty concentrating or making decisions? 0 0  Walking or climbing stairs? 0 0  Dressing or bathing? 0 0  Doing errands, shopping? 0 0  Preparing Food and eating ? N   Using the Toilet? N   In the past six months, have you accidently leaked urine? N   Do you have problems with loss of bowel control? N   Managing your Medications? N   Managing your Finances? N   Housekeeping or managing your Housekeeping? N     Patient Care Team: Alba Cory, MD as PCP - General (Family Medicine)  Indicate any recent Medical Services you may have received from other than [facility] providers in the past year (date may be approximate).     Assessment:   This is a routine wellness examination for Dawn.  Hearing/Vision screen Hearing Screening - Comments:: Adequate hearing Vision Screening - Comments:: Adequate vision with glasses [facility]  Dietary issues and exercise activities discussed:     Goals Addressed             This Visit's Progress    DIET - INCREASE WATER INTAKE   On track    Recommend to drink at least 6-8 8oz glasses of water per day.     maintain weight   On track    Patient would like to maintain her weight to be less than 160#       Depression Screen    11/28/2022    9:26 AM 09/27/2022    8:58 AM 06/28/2022    2:38 PM 05/27/2022   10:59 AM 01/25/2022   11:22 AM 12/13/2021   10:17 AM 11/20/2021    11:14 AM  PHQ 2/9 Scores  PHQ - 2 Score 0 0 0 0 0 2 0  PHQ- 9 Score  0 0 1 0 3 4    Fall Risk    11/28/2022    9:21 AM 09/27/2022    8:58 AM 06/28/2022    2:38 PM 05/27/2022   10:59 AM 01/25/2022   11:21 AM  Fall Risk   Falls in the past year? 0 0 1 1 0  Number falls in past yr: 0 0 0 0   Injury with Fall? 0 0 1 0   Risk for fall due to : No Fall Risks No Fall Risks Impaired balance/gait No Fall Risks   Follow up Education provided;Falls prevention discussed Falls prevention discussed Falls prevention discussed;Education provided;Falls evaluation completed Falls prevention discussed Falls prevention discussed    MEDICARE RISK AT HOME:  Medicare Risk at Home - 11/28/22 0934     Any stairs in or around the home? Yes   ramp   If so, are there any without handrails? Yes    Home free of loose throw rugs in walkways, pet beds, electrical cords, etc? Yes    Adequate lighting in your home to reduce risk of falls? Yes    Life alert? No    Use of a cane, walker or w/c? No    Grab bars in the bathroom? Yes    Shower chair or bench in shower? Yes    Elevated toilet seat or a handicapped toilet? Yes             TIMED UP AND GO:  Was the test performed?  No    Cognitive Function:        11/28/2022    9:32 AM 10/19/2019    8:54 AM 10/15/2018  9:08 AM 10/10/2017    8:33 AM 06/24/2016   11:19 AM  6CIT Screen  What Year? 0 points 0 points 0 points 0 points 0 points  What month? 0 points 0 points 0 points 0 points 0 points  What time? 0 points 0 points 0 points 0 points 0 points  Count back from 20 0 points 0 points 0 points 0 points 0 points  Months in reverse 0 points 0 points 0 points 0 points 0 points  Repeat phrase 0 points 4 points 4 points 0 points 2 points  Total Score 0 points 4 points 4 points 0 points 2 points    Immunizations Immunization History  Administered Date(s) Administered   Fluad Quad(high Dose 65+) 02/16/2019, 03/20/2020, 05/27/2022   Influenza, High  Dose Seasonal PF 02/20/2015, 05/24/2016, 01/29/2017, 02/10/2018   Moderna SARS-COV2 Booster Vaccination 05/24/2021, 04/22/2022   Moderna Sars-Covid-2 Vaccination 07/05/2019, 08/02/2019, 04/01/2020, 10/10/2020   Pneumococcal Conjugate-13 01/10/2016   Pneumococcal Polysaccharide-23 11/18/2014   Td 01/29/2017   Tdap 04/10/2007   Tetanus 04/10/2007   Zoster Recombinat (Shingrix) 11/29/2021    TDAP status: Up to date  Flu Vaccine status: Up to date  Pneumococcal vaccine status: Up to date  Covid-19 vaccine status: Completed vaccines  Qualifies for Shingles Vaccine? Yes   Zostavax completed No   Shingrix Completed?: No.    Education has been provided regarding the importance of this vaccine. Patient has been advised to call insurance company to determine out of pocket expense if they have not yet received this vaccine. Advised may also receive vaccine at local pharmacy or Health Dept. Verbalized acceptance and understanding.  Screening Tests Health Maintenance  Topic Date Due   COVID-19 Vaccine (5 - 2023-24 season) 06/17/2022   Zoster Vaccines- Shingrix (2 of 2) 12/26/2022 (Originally 01/24/2022)   INFLUENZA VACCINE  01/09/2023   HEMOGLOBIN A1C  04/19/2023   MAMMOGRAM  05/25/2023   FOOT EXAM  05/28/2023   Colonoscopy  06/19/2023   Diabetic kidney evaluation - eGFR measurement  09/27/2023   Diabetic kidney evaluation - Urine ACR  09/27/2023   OPHTHALMOLOGY EXAM  10/18/2023   Medicare Annual Wellness (AWV)  11/28/2023   DTaP/Tdap/Td (4 - Td or Tdap) 01/30/2027   Pneumonia Vaccine 31+ Years old  Completed   DEXA SCAN  Completed   Hepatitis C Screening  Completed   HPV VACCINES  Aged Out    Health Maintenance  Health Maintenance Due  Topic Date Due   COVID-19 Vaccine (5 - 2023-24 season) 06/17/2022    Colorectal cancer screening: Type of screening: Colonoscopy. Completed yes. Repeat every 10 years  Mammogram status: Completed yes. Repeat every year  Bone Density status:  Completed yes. Results reflect: Bone density results: NORMAL. Repeat every 5 years.  Lung Cancer Screening: (Low Dose CT Chest recommended if Age 33-80 years, 20 pack-year currently smoking OR have quit w/in 15years.) does not qualify.   Lung Cancer Screening Referral: no  Additional Screening:  Hepatitis C Screening: does not qualify; Completed yes  Vision Screening: Recommended annual ophthalmology exams for early detection of glaucoma and other disorders of the eye. Is the patient up to date with their annual eye exam?  Yes  Who is the provider or what is the name of the office in which the patient attends annual eye exams? [facility] If pt is not established with a provider, would they like to be referred to a provider to establish care? No .   Dental Screening: Recommended annual dental exams  for proper oral hygiene  Diabetic Foot Exam: Diabetic Foot Exam: Completed yes  Community Resource Referral / Chronic Care Management: CRR required this visit?  No   CCM required this visit?  No    Plan:     I have personally reviewed and noted the following in the patient's chart:   Medical and social history Use of alcohol, tobacco or illicit drugs  Current medications and supplements including opioid prescriptions. Patient is not currently taking opioid prescriptions. Functional ability and status Nutritional status Physical activity Advanced directives List of other physicians Hospitalizations, surgeries, and ER visits in previous 12 months Vitals Screenings to include cognitive, depression, and falls Referrals and appointments  In addition, I have reviewed and discussed with patient certain preventive protocols, quality metrics, and best practice recommendations. A written personalized care plan for preventive services as well as general preventive health recommendations were provided to patient.     Sue Lush, LPN   1/61/0960   After Visit Summary: (MyChart)  Due to this being a telephonic visit, the after visit summary with patients personalized plan was offered to patient via MyChart   Nurse Notes: The patient states she is doing well and has no concerns or questions at this time.

## 2022-12-06 DIAGNOSIS — M1612 Unilateral primary osteoarthritis, left hip: Secondary | ICD-10-CM | POA: Diagnosis not present

## 2022-12-26 ENCOUNTER — Other Ambulatory Visit: Payer: Self-pay | Admitting: Family Medicine

## 2022-12-26 DIAGNOSIS — I10 Essential (primary) hypertension: Secondary | ICD-10-CM

## 2022-12-26 DIAGNOSIS — I252 Old myocardial infarction: Secondary | ICD-10-CM

## 2022-12-31 ENCOUNTER — Other Ambulatory Visit: Payer: Self-pay | Admitting: Family Medicine

## 2022-12-31 DIAGNOSIS — M5412 Radiculopathy, cervical region: Secondary | ICD-10-CM

## 2022-12-31 DIAGNOSIS — E1129 Type 2 diabetes mellitus with other diabetic kidney complication: Secondary | ICD-10-CM

## 2022-12-31 DIAGNOSIS — G4700 Insomnia, unspecified: Secondary | ICD-10-CM

## 2022-12-31 DIAGNOSIS — M5416 Radiculopathy, lumbar region: Secondary | ICD-10-CM

## 2022-12-31 NOTE — Telephone Encounter (Signed)
Medication Refill - Medication: pregabalin (LYRICA) 50 MG capsule /zolpidem (AMBIEN) 10 MG tablet    Has the patient contacted their pharmacy? yes (Agent: If no, request that the patient contact the pharmacy for the refill. If patient does not wish to contact the pharmacy document the reason why and proceed with request.) (Agent: If yes, when and what did the pharmacy advise?)contact pcp  Preferred Pharmacy (with phone number or street name):  [id number] - [place], [place] - [address] [place] [address] AT [facility] [address] [facility] [address] Phone: [phone]  Fax: [phone]     Has the patient been seen for an appointment in the last year OR does the patient have an upcoming appointment? yes  Agent: Please be advised that RX refills may take up to 3 business days. We ask that you follow-up with your pharmacy.

## 2023-01-01 ENCOUNTER — Other Ambulatory Visit: Payer: Self-pay

## 2023-01-01 MED ORDER — PREGABALIN 50 MG PO CAPS: 50.0000 mg | ORAL_CAPSULE | Freq: Two times a day (BID) | ORAL | 0 refills | Status: DC

## 2023-01-01 MED ORDER — ZOLPIDEM TARTRATE 10 MG PO TABS: 10.0000 mg | ORAL_TABLET | Freq: Every day | ORAL | 0 refills | Status: DC

## 2023-01-01 NOTE — Telephone Encounter (Signed)
Requested medication (s) are due for refill today: yes  Requested medication (s) are on the active medication list: yes  Last refill:  same day: 09/27/22 pregabalin:  #180  Ambien:  #90   Future visit scheduled: yes  Notes to clinic:  meds not delegated to NT to RF   Requested Prescriptions  Pending Prescriptions Disp Refills   pregabalin (LYRICA) 50 MG capsule 180 capsule 0    Sig: Take 1 capsule (50 mg total) by mouth 2 (two) times daily.     Not Delegated - Neurology:  Anticonvulsants - Controlled - pregabalin Failed - 12/31/2022 12:11 PM      Failed - This refill cannot be delegated      Passed - Cr in normal range and within 360 days    Creat  Date Value Ref Range Status  09/27/2022 0.74 0.60 - 1.00 mg/dL Final   Creatinine, Urine  Date Value Ref Range Status  09/27/2022 171 20 - 275 mg/dL Final         Passed - Completed PHQ-2 or PHQ-9 in the last 360 days      Passed - Valid encounter within last 12 months    Recent Outpatient Visits           3 months ago Type 2 diabetes mellitus with microalbuminuria, without long-term current use of insulin [facility])   [place] [facility] Alba Cory, MD   6 months ago Dysuria   [facility] Margarita Mail, DO   7 months ago Type 2 diabetes mellitus with microalbuminuria, without long-term current use of insulin [facility])   [place] [facility] Alba Cory, MD   11 months ago Type 2 diabetes mellitus with microalbuminuria, without long-term current use of insulin [facility])   [place] [facility] Alba Cory, MD   1 year ago Left lumbar radiculitis   [facility] Alba Cory, MD       Future Appointments             In 4 weeks Alba Cory, MD [facility]             zolpidem (AMBIEN) 10 MG tablet 90 tablet 0    Sig: Take 1 tablet (10 mg total) by mouth at bedtime.      Not Delegated - Psychiatry:  Anxiolytics/Hypnotics Failed - 12/31/2022 12:11 PM      Failed - This refill cannot be delegated      Failed - Urine Drug Screen completed in last 360 days      Passed - Valid encounter within last 6 months    Recent Outpatient Visits           3 months ago Type 2 diabetes mellitus with microalbuminuria, without long-term current use of insulin [facility])   [place] [facility] Alba Cory, MD   6 months ago Dysuria   [facility] Margarita Mail, DO   7 months ago Type 2 diabetes mellitus with microalbuminuria, without long-term current use of insulin [facility])   [place] [facility] Alba Cory, MD   11 months ago Type 2 diabetes mellitus with microalbuminuria, without long-term current use of insulin [facility])   [place] [facility] Alba Cory, MD   1 year ago Left lumbar radiculitis   [facility] Alba Cory, MD       Future Appointments  In 4 weeks Alba Cory, MD [facility]

## 2023-01-29 NOTE — Progress Notes (Unsigned)
Name: Dawn Werner   MRN: [id number]    DOB: 09/21/1950   Date:01/30/2023       Progress Note  Subjective  Chief Complaint  Follow Up  HPI  MDD  in remission : she still has some marital problems , able to ignore him and sometimes confronts him, she also has close friends.  Since she started on Celexa she seems to be doing better.   Shingles on left buttocks: she has a history of shingles on ophthalmic area years ago.  Doing well for a while   Chronic low back pain with radiculitis: going on for over one year,  she was seen by Ortho, had left trochanteric bursa injection and has been doing well since, still takes Lyrica 50 mg bid but having more pain, unable to work in her yard, gaining weight, she has a visit scheduled with Ortho but continues to have radiculitis , we will adjust dose of Lyrica and order MRI. No bowel and bladder incontinence. Sometimes radiates to left groin and is affecting her qualify of life   Left cervical radiculitis: symptoms are under control at this time, takes tizanidine prn muscle spasms   DMII:  last A1C was 6.4 %   She has history of nephropathy and is on ARB .She denies polyphagia, polydipsia or polyuria. She is taking Metformin ER to 750 mg ER and denies side effects of medications , last B12 towards low end of normal, advised to take SL B12 once a week.   OA both knees and left hip: she is under care of ortho   Hyperlipidemia: she is back on Crestor 40 mg and Zetia 10 mg daily Denies myalgias Last LDL at goal at 61 - well controlled   HTN:  No chest pain, palpitation or SOB , taking medication as prescribed. BP is at good . Unchanged   CAD: no episodes, s/p history of MI in 2009, taking aspirin daily,ARB,  beta-blocker and statin therapy.  Compliant with medications and denies chest pain or decrease in exercise tolerance . She has not seen cardiologist in a long time    Asthma Mild Intermittent: she has been doing well, no cough, wheezing or SOB  lately, she has to use Breo daily during the month of March due to high pollen count , doing well at this time   Atherosclerosis of aorta: on statin, aspirin  81 mg daily  last LDL at goal.    GERD: doing well on Omeprazole prn when triggered by certain types of food . Doing well   Insomnia: she had weaned self off but in March she resumed taking it.  She has been taking it again, only one third of tablet and has been helping her sleep well and able to help with symptoms . 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   Primary osteoarthritis of knee 01/24/2015   Asthma, mild intermittent 11/18/2014   Arteriosclerosis of coronary artery 11/18/2014   Insomnia, persistent 11/18/2014   Dyslipidemia 11/18/2014   Acid reflux 11/18/2014   Diabetes type 2, controlled (HCC) 11/18/2014   Genital herpes in  women 11/18/2014   Hypertension, benign 11/18/2014   Asymptomatic postmenopausal status 11/18/2014   History of acute myocardial infarction 11/18/2014    Past Surgical History:  Procedure Laterality Date   ABDOMINAL HYSTERECTOMY     BREAST SURGERY     breast reduction   REDUCTION MAMMAPLASTY Bilateral 1995   TUBAL LIGATION      Family History  Problem Relation Age of Onset   Hyperlipidemia Mother    Hypertension Mother    Stroke Mother    Dementia Father    Cancer Sister        breast   Breast cancer Sister 13   Asthma Brother    Depression Brother        bipolar   Heart disease Brother    Heart disease Brother     Social History   Tobacco Use   Smoking  status: Never   Smokeless tobacco: Never  Substance Use Topics   Alcohol use: No    Alcohol/week: 0.0 standard drinks of alcohol     Current Outpatient Medications:    albuterol (VENTOLIN HFA) 108 (90 Base) MCG/ACT inhaler, Inhale 2-4 puffs into the lungs every 6 (six) hours as needed., Disp: 8 g, Rfl: 0   Blood Glucose Monitoring Suppl (ACCU-CHEK GUIDE ME) w/Device KIT, USE AS DIRECTED, Disp: 1 kit, Rfl: 0   carvedilol (COREG) 3.125 MG tablet, TAKE 1 TABLET(3.125 MG) BY MOUTH TWICE DAILY, Disp: 180 tablet, Rfl: 1   diazepam (VALIUM) 5 MG tablet, Take 1-2 tablets (5-10 mg total) by mouth once for 1 dose., Disp: 2 tablet, Rfl: 0   ezetimibe (ZETIA) 10 MG tablet, Take 1 tablet (10 mg total) by mouth daily., Disp: 90 tablet, Rfl: 1   metFORMIN (GLUCOPHAGE-XR) 750 MG 24 hr tablet, TAKE 1 TABLET BY MOUTH EVERY DAY WITH BREAKFAST, Disp: 90 tablet, Rfl: 1   zolpidem (AMBIEN) 10 MG tablet, Take 1 tablet (10 mg total) by mouth at bedtime., Disp: 90 tablet, Rfl: 0   citalopram (CELEXA) 20 MG tablet, Take 1 tablet (20 mg total) by mouth daily., Disp: 90 tablet, Rfl: 0   losartan (COZAAR) 25 MG tablet, Take 1 tablet (25 mg total) by mouth daily., Disp: 90 tablet, Rfl: 3   omeprazole (PRILOSEC) 40 MG capsule, Take 1 capsule (40 mg total) by mouth daily., Disp: 90 capsule, Rfl: 3   pregabalin (LYRICA) 75 MG capsule, Take 1-2 capsules (75-150 mg total) by mouth 3 (three) times daily., Disp: 360 capsule, Rfl: 0   rosuvastatin (CRESTOR) 40 MG tablet, Take 1 tablet (40 mg total) by mouth daily., Disp: 90 tablet, Rfl: 3   tiZANidine (ZANAFLEX) 2 MG tablet, Take 1 tablet (2 mg total) by mouth at bedtime as needed for muscle spasms., Disp: 90 tablet, Rfl: 0  Allergies  Allergen Reactions   Metformin And Related Diarrhea    And nausea. Tolerates XR formulation    I personally reviewed active problem list, medication list, allergies, family history, social history, health maintenance with the patient/caregiver  today.   ROS  Constitutional: Negative for fever , positive for weight change.  Respiratory: Negative for cough and shortness of breath.   Cardiovascular: Negative for chest pain or palpitations.  Gastrointestinal: Negative for abdominal pain, no bowel changes.  Musculoskeletal: Negative for gait problem or joint swelling.  Skin: Negative for rash.  Neurological: Negative for dizziness or headache.  No other specific complaints in a complete review of systems (except as listed in HPI above).   Objective  Vitals:  01/30/23 0838  BP: 120/74  Pulse: 82  Resp: 16  Temp: 97.7 F (36.5 C)  TempSrc: Oral  SpO2: 98%  Weight: 164 lb 9.6 oz (74.7 kg)  Height: 5\' 3"  (1.6 m)    Body mass index is 29.16 kg/m.  Physical Exam  Constitutional: Patient appears well-developed and well-nourished. Obese  No distress.  HEENT: head atraumatic, normocephalic, pupils equal and reactive to light, neck supple Cardiovascular: Normal rate, regular rhythm and normal heart sounds.  No murmur heard. No BLE edema. Pulmonary/Chest: Effort normal and breath sounds normal. No respiratory distress. Abdominal: Soft.  There is no tenderness. Psychiatric: Patient has a normal mood and affect. behavior is normal. Judgment and thought content normal.   PHQ2/9:    01/30/2023    8:34 AM 11/28/2022    9:26 AM 09/27/2022    8:58 AM 06/28/2022    2:38 PM 05/27/2022   10:59 AM  Depression screen PHQ 2/9  Decreased Interest 0 0 0 0 0  Down, Depressed, Hopeless 0 0 0 0 0  PHQ - 2 Score 0 0 0 0 0  Altered sleeping 0  0 0 1  Tired, decreased energy 0  0 0 0  Change in appetite 0  0 0 0  Feeling bad or failure about yourself  0  0 0 0  Trouble concentrating 0  0 0 0  Moving slowly or fidgety/restless 0  0 0 0  Suicidal thoughts 0  0 0 0  PHQ-9 Score 0  0 0 1  Difficult doing work/chores Not difficult at all   Not difficult at all     phq 9 is negative   Fall Risk:    01/30/2023    8:34 AM 11/28/2022     9:21 AM 09/27/2022    8:58 AM 06/28/2022    2:38 PM 05/27/2022   10:59 AM  Fall Risk   Falls in the past year? 0 0 0 1 1  Number falls in past yr: 0 0 0 0 0  Injury with Fall? 0 0 0 1 0  Risk for fall due to : No Fall Risks No Fall Risks No Fall Risks Impaired balance/gait No Fall Risks  Follow up Falls prevention discussed;Education provided;Falls evaluation completed Education provided;Falls prevention discussed Falls prevention discussed Falls prevention discussed;Education provided;Falls evaluation completed Falls prevention discussed     Assessment & Plan  1. Type 2 diabetes mellitus with microalbuminuria, without long-term current use of insulin (HCC)  - losartan (COZAAR) 25 MG tablet; Take 1 tablet (25 mg total) by mouth daily.  Dispense: 90 tablet; Refill: 3  2. Major depressive disorder with single episode, in full remission (HCC)  - citalopram (CELEXA) 20 MG tablet; Take 1 tablet (20 mg total) by mouth daily.  Dispense: 90 tablet; Refill: 0  3. Essential hypertension  - losartan (COZAAR) 25 MG tablet; Take 1 tablet (25 mg total) by mouth daily.  Dispense: 90 tablet; Refill: 3  4. History of acute myocardial infarction  - losartan (COZAAR) 25 MG tablet; Take 1 tablet (25 mg total) by mouth daily.  Dispense: 90 tablet; Refill: 3 - rosuvastatin (CRESTOR) 40 MG tablet; Take 1 tablet (40 mg total) by mouth daily.  Dispense: 90 tablet; Refill: 3  5. Gastroesophageal reflux disease without esophagitis  - omeprazole (PRILOSEC) 40 MG capsule; Take 1 capsule (40 mg total) by mouth daily.  Dispense: 90 capsule; Refill: 3  6. Left lumbar radiculitis  - pregabalin (LYRICA) 75 MG capsule; Take 1-2 capsules (  75-150 mg total) by mouth 3 (three) times daily.  Dispense: 360 capsule; Refill: 0 - tiZANidine (ZANAFLEX) 2 MG tablet; Take 1 tablet (2 mg total) by mouth at bedtime as needed for muscle spasms.  Dispense: 90 tablet; Refill: 0 - MR Lumbar Spine Wo Contrast; Future - diazepam  (VALIUM) 5 MG tablet; Take 1-2 tablets (5-10 mg total) by mouth once for 1 dose.  Dispense: 2 tablet; Refill: 0  7. Dyslipidemia  - rosuvastatin (CRESTOR) 40 MG tablet; Take 1 tablet (40 mg total) by mouth daily.  Dispense: 90 tablet; Refill: 3

## 2023-01-30 ENCOUNTER — Ambulatory Visit (INDEPENDENT_AMBULATORY_CARE_PROVIDER_SITE_OTHER): Payer: Medicare Other | Admitting: Family Medicine

## 2023-01-30 ENCOUNTER — Encounter: Payer: Self-pay | Admitting: Family Medicine

## 2023-01-30 ENCOUNTER — Ambulatory Visit: Payer: Medicare Other | Admitting: Family Medicine

## 2023-01-30 VITALS — BP 120/74 | HR 82 | Temp 97.7°F | Resp 16 | Ht 63.0 in | Wt 164.6 lb

## 2023-01-30 DIAGNOSIS — I252 Old myocardial infarction: Secondary | ICD-10-CM | POA: Diagnosis not present

## 2023-01-30 DIAGNOSIS — K219 Gastro-esophageal reflux disease without esophagitis: Secondary | ICD-10-CM | POA: Diagnosis not present

## 2023-01-30 DIAGNOSIS — I10 Essential (primary) hypertension: Secondary | ICD-10-CM | POA: Diagnosis not present

## 2023-01-30 DIAGNOSIS — Z7984 Long term (current) use of oral hypoglycemic drugs: Secondary | ICD-10-CM | POA: Diagnosis not present

## 2023-01-30 DIAGNOSIS — F325 Major depressive disorder, single episode, in full remission: Secondary | ICD-10-CM

## 2023-01-30 DIAGNOSIS — E1129 Type 2 diabetes mellitus with other diabetic kidney complication: Secondary | ICD-10-CM

## 2023-01-30 DIAGNOSIS — R809 Proteinuria, unspecified: Secondary | ICD-10-CM | POA: Diagnosis not present

## 2023-01-30 DIAGNOSIS — M5416 Radiculopathy, lumbar region: Secondary | ICD-10-CM | POA: Diagnosis not present

## 2023-01-30 DIAGNOSIS — E785 Hyperlipidemia, unspecified: Secondary | ICD-10-CM | POA: Diagnosis not present

## 2023-01-30 MED ORDER — OMEPRAZOLE 40 MG PO CPDR: 40.0000 mg | DELAYED_RELEASE_CAPSULE | Freq: Every day | ORAL | 3 refills | Status: DC

## 2023-01-30 MED ORDER — CITALOPRAM HYDROBROMIDE 20 MG PO TABS: 20.0000 mg | ORAL_TABLET | Freq: Every day | ORAL | 0 refills | Status: DC

## 2023-01-30 MED ORDER — ROSUVASTATIN CALCIUM 40 MG PO TABS: 40.0000 mg | ORAL_TABLET | Freq: Every day | ORAL | 3 refills | Status: DC

## 2023-01-30 MED ORDER — DIAZEPAM 5 MG PO TABS: 5.0000 mg | ORAL_TABLET | Freq: Once | ORAL | 0 refills | Status: AC

## 2023-01-30 MED ORDER — LOSARTAN POTASSIUM 25 MG PO TABS: 25.0000 mg | ORAL_TABLET | Freq: Every day | ORAL | 3 refills | Status: DC

## 2023-01-30 MED ORDER — TIZANIDINE HCL 2 MG PO TABS: 2.0000 mg | ORAL_TABLET | Freq: Every evening | ORAL | 0 refills | Status: DC | PRN

## 2023-01-30 MED ORDER — PREGABALIN 75 MG PO CAPS: 75.0000 mg | ORAL_CAPSULE | Freq: Three times a day (TID) | ORAL | 0 refills | Status: DC

## 2023-01-30 NOTE — Patient Instructions (Signed)
B12 sublingually once a week

## 2023-02-25 DIAGNOSIS — M1712 Unilateral primary osteoarthritis, left knee: Secondary | ICD-10-CM | POA: Diagnosis not present

## 2023-02-25 DIAGNOSIS — M25562 Pain in left knee: Secondary | ICD-10-CM | POA: Diagnosis not present

## 2023-02-25 DIAGNOSIS — M1612 Unilateral primary osteoarthritis, left hip: Secondary | ICD-10-CM | POA: Diagnosis not present

## 2023-02-28 DIAGNOSIS — H2513 Age-related nuclear cataract, bilateral: Secondary | ICD-10-CM | POA: Diagnosis not present

## 2023-02-28 DIAGNOSIS — H5213 Myopia, bilateral: Secondary | ICD-10-CM | POA: Diagnosis not present

## 2023-02-28 DIAGNOSIS — E11319 Type 2 diabetes mellitus with unspecified diabetic retinopathy without macular edema: Secondary | ICD-10-CM | POA: Diagnosis not present

## 2023-03-03 ENCOUNTER — Other Ambulatory Visit: Payer: Self-pay | Admitting: Orthopedic Surgery

## 2023-03-17 ENCOUNTER — Ambulatory Visit (INDEPENDENT_AMBULATORY_CARE_PROVIDER_SITE_OTHER): Payer: Medicare Other | Admitting: Physician Assistant

## 2023-03-17 ENCOUNTER — Encounter: Payer: Self-pay | Admitting: Physician Assistant

## 2023-03-17 VITALS — BP 100/62 | HR 75 | Temp 98.3°F | Resp 16 | Ht 63.0 in | Wt 164.7 lb

## 2023-03-17 DIAGNOSIS — E119 Type 2 diabetes mellitus without complications: Secondary | ICD-10-CM | POA: Diagnosis not present

## 2023-03-17 DIAGNOSIS — Z01818 Encounter for other preprocedural examination: Secondary | ICD-10-CM

## 2023-03-17 NOTE — Progress Notes (Signed)
Acute Office Visit   Patient: Dawn Werner   DOB: 1950/12/28   72 y.o. Female  MRN: [id number] Visit Date: 03/17/2023  Today's healthcare provider: Oswaldo Conroy Hazelgrace Bonham, PA-C  Introduced myself to the patient as a Secondary school teacher and provided education on APPs in clinical practice.    Chief Complaint  Patient presents with   Pre-op Exam    Surgical clearance-hip   Subjective    HPI HPI     Pre-op Exam    Additional comments: Surgical clearance-hip      Last edited by Forde Radon, CMA on 03/17/2023 10:24 AM.       Patient is having total hip replacement on left hip on 03/31/23  Requesting surgical clearance today  She denies previous adverse reactions to anesthesia to her knowledge Breathing: hx of mild intermittent asthma, denies known hx of OSA, she has been told she snores but has not been made aware of apneic events She uses inhaler 2-3 times per month   Diabetes: most recent A1c was 6.4- recheck today   She is not on anticoagulation currently per chart review  She is not on GLP-1 agonist     Medications: Outpatient Medications Prior to Visit  Medication Sig   albuterol (VENTOLIN HFA) 108 (90 Base) MCG/ACT inhaler Inhale 2-4 puffs into the lungs every 6 (six) hours as needed.   Blood Glucose Monitoring Suppl (ACCU-CHEK GUIDE ME) w/Device KIT USE AS DIRECTED   carvedilol (COREG) 3.125 MG tablet TAKE 1 TABLET(3.125 MG) BY MOUTH TWICE DAILY   citalopram (CELEXA) 20 MG tablet Take 1 tablet (20 mg total) by mouth daily.   ezetimibe (ZETIA) 10 MG tablet Take 1 tablet (10 mg total) by mouth daily.   losartan (COZAAR) 25 MG tablet Take 1 tablet (25 mg total) by mouth daily.   metFORMIN (GLUCOPHAGE-XR) 750 MG 24 hr tablet TAKE 1 TABLET BY MOUTH EVERY DAY WITH BREAKFAST   omeprazole (PRILOSEC) 40 MG capsule Take 1 capsule (40 mg total) by mouth daily.   pregabalin (LYRICA) 75 MG capsule Take 1-2 capsules (75-150 mg total) by mouth 3 (three) times daily.    rosuvastatin (CRESTOR) 40 MG tablet Take 1 tablet (40 mg total) by mouth daily.   tiZANidine (ZANAFLEX) 2 MG tablet Take 1 tablet (2 mg total) by mouth at bedtime as needed for muscle spasms.   zolpidem (AMBIEN) 10 MG tablet Take 1 tablet (10 mg total) by mouth at bedtime.   No facility-administered medications prior to visit.    Review of Systems  Constitutional:  Negative for chills, fatigue and fever.  Eyes:  Negative for visual disturbance.  Respiratory:  Negative for chest tightness, shortness of breath and wheezing.   Cardiovascular:  Negative for chest pain, palpitations and leg swelling.  Musculoskeletal:  Positive for arthralgias.  Neurological:  Negative for dizziness, facial asymmetry and headaches.        Objective    BP 100/62   Pulse 75   Temp 98.3 F (36.8 C) (Oral)   Resp 16   Ht 5\' 3"  (1.6 m)   Wt 164 lb 11.2 oz (74.7 kg)   SpO2 98%   BMI 29.18 kg/m     Physical Exam Vitals reviewed.  Constitutional:      General: She is awake.     Appearance: Normal appearance. She is well-developed and well-groomed.  HENT:     Head: Normocephalic and atraumatic.  Cardiovascular:     Rate and Rhythm:  Normal rate and regular rhythm.     Pulses: Normal pulses.          Radial pulses are 2+ on the right side and 2+ on the left side.     Heart sounds: Normal heart sounds. No murmur heard.    No friction rub. No gallop.  Pulmonary:     Effort: Pulmonary effort is normal.     Breath sounds: Normal breath sounds. No decreased air movement. No decreased breath sounds, wheezing, rhonchi or rales.  Musculoskeletal:     Cervical back: Normal range of motion.     Right lower leg: No edema.     Left lower leg: No edema.  Neurological:     General: No focal deficit present.     Mental Status: She is alert and oriented to person, place, and time.     GCS: GCS eye subscore is 4. GCS verbal subscore is 5. GCS motor subscore is 6.     Cranial Nerves: No cranial nerve deficit,  dysarthria or facial asymmetry.     Motor: No weakness, tremor, atrophy or abnormal muscle tone.  Psychiatric:        Attention and Perception: Attention and perception normal.        Mood and Affect: Mood and affect normal.        Speech: Speech normal.        Behavior: Behavior normal. Behavior is cooperative.       No results found for any visits on 03/17/23.  Assessment & Plan      No follow-ups on file.       Problem List Items Addressed This Visit   None Visit Diagnoses     Pre-op evaluation    -  Primary Patient has total hip arthroplasty scheduled for her left hip on 03/31/2023 Orthopedic surgeon is requesting preoperative clearance As of today's appointment she appears stable and suitable for surgical procedure Still awaiting results of CMP, CBC, A1c Recommend optimizing diabetes and blood pressure control prior to surgery-will confirm this with above testing She is not currently taking any anticoagulants or GLP-1 medications which would need to be discontinued prior to surgery She denies previous adverse events to sedation/anesthesia Physical exam is overall reassuring Will sign preoperative clearance paperwork once labs are back and faxed to surgical office per request    Relevant Orders   COMPLETE METABOLIC PANEL WITH GFR   CBC w/Diff/Platelet   HgB A1c        No follow-ups on file.   I, Kalila Adkison E Tashema Tiller, PA-C, have reviewed all documentation for this visit. The documentation on 03/17/23 for the exam, diagnosis, procedures, and orders are all accurate and complete.   Jacquelin Hawking, MHS, PA-C [facility]

## 2023-03-18 LAB — CBC WITH DIFFERENTIAL/PLATELET
Absolute Monocytes: 428 {cells}/uL (ref 200–950)
Basophils Absolute: 51 {cells}/uL (ref 0–200)
Basophils Relative: 0.9 %
Eosinophils Absolute: 91 {cells}/uL (ref 15–500)
Eosinophils Relative: 1.6 %
HCT: 42.9 % (ref 35.0–45.0)
Hemoglobin: 13.6 g/dL (ref 11.7–15.5)
Lymphs Abs: 1910 {cells}/uL (ref 850–3900)
MCH: 28.2 pg (ref 27.0–33.0)
MCHC: 31.7 g/dL — ABNORMAL LOW (ref 32.0–36.0)
MCV: 89 fL (ref 80.0–100.0)
MPV: 10.5 fL (ref 7.5–12.5)
Monocytes Relative: 7.5 %
Neutro Abs: 3221 {cells}/uL (ref 1500–7800)
Neutrophils Relative %: 56.5 %
Platelets: 269 10*3/uL (ref 140–400)
RBC: 4.82 10*6/uL (ref 3.80–5.10)
RDW: 11.4 % (ref 11.0–15.0)
Total Lymphocyte: 33.5 %
WBC: 5.7 10*3/uL (ref 3.8–10.8)

## 2023-03-18 LAB — COMPLETE METABOLIC PANEL WITH GFR
AG Ratio: 1.7 (calc) (ref 1.0–2.5)
ALT: 39 U/L — ABNORMAL HIGH (ref 6–29)
AST: 32 U/L (ref 10–35)
Albumin: 4.5 g/dL (ref 3.6–5.1)
Alkaline phosphatase (APISO): 71 U/L (ref 37–153)
BUN: 8 mg/dL (ref 7–25)
CO2: 28 mmol/L (ref 20–32)
Calcium: 9.7 mg/dL (ref 8.6–10.4)
Chloride: 105 mmol/L (ref 98–110)
Creat: 0.61 mg/dL (ref 0.60–1.00)
Globulin: 2.6 g/dL (ref 1.9–3.7)
Glucose, Bld: 128 mg/dL — ABNORMAL HIGH (ref 65–99)
Potassium: 4.1 mmol/L (ref 3.5–5.3)
Sodium: 141 mmol/L (ref 135–146)
Total Bilirubin: 0.4 mg/dL (ref 0.2–1.2)
Total Protein: 7.1 g/dL (ref 6.1–8.1)
eGFR: 94 mL/min/{1.73_m2} (ref >=60)

## 2023-03-18 LAB — HEMOGLOBIN A1C
Hgb A1c MFr Bld: 7.7 %{Hb} — ABNORMAL HIGH (ref <5.7)
Mean Plasma Glucose: 174 mg/dL
eAG (mmol/L): 9.7 mmol/L

## 2023-03-19 DIAGNOSIS — M1612 Unilateral primary osteoarthritis, left hip: Secondary | ICD-10-CM | POA: Diagnosis not present

## 2023-03-19 NOTE — Progress Notes (Signed)
Your lab results are back Your electrolytes, liver and kidney function were overall normal at this time Your CBC was overall normal, no signs of anemia Your A1c was 7.7 which is above goal.  At this time I would recommend taking your metformin twice a day with meals to help bring this back into goal.  Please let us know if you have further questions.

## 2023-03-20 ENCOUNTER — Other Ambulatory Visit: Payer: Self-pay

## 2023-03-20 ENCOUNTER — Encounter
Admission: RE | Admit: 2023-03-20 | Discharge: 2023-03-20 | Disposition: A | Discharge status: Home / Self Care | Payer: Medicare Other | Source: Ambulatory Visit | Attending: Orthopedic Surgery | Admitting: Orthopedic Surgery

## 2023-03-20 VITALS — BP 139/74 | HR 73 | Resp 16 | Ht 63.0 in | Wt 167.8 lb

## 2023-03-20 DIAGNOSIS — I10 Essential (primary) hypertension: Secondary | ICD-10-CM | POA: Insufficient documentation

## 2023-03-20 DIAGNOSIS — Z01812 Encounter for preprocedural laboratory examination: Secondary | ICD-10-CM

## 2023-03-20 DIAGNOSIS — R9431 Abnormal electrocardiogram [ECG] [EKG]: Secondary | ICD-10-CM | POA: Diagnosis not present

## 2023-03-20 DIAGNOSIS — E119 Type 2 diabetes mellitus without complications: Secondary | ICD-10-CM | POA: Diagnosis not present

## 2023-03-20 DIAGNOSIS — Z01810 Encounter for preprocedural cardiovascular examination: Secondary | ICD-10-CM | POA: Diagnosis not present

## 2023-03-20 DIAGNOSIS — Z01818 Encounter for other preprocedural examination: Secondary | ICD-10-CM | POA: Diagnosis not present

## 2023-03-20 HISTORY — DX: Essential (primary) hypertension: I10

## 2023-03-20 HISTORY — DX: Atherosclerotic heart disease of native coronary artery without angina pectoris: I25.10

## 2023-03-20 HISTORY — DX: Unspecified osteoarthritis, unspecified site: M19.90

## 2023-03-20 HISTORY — DX: Acute myocardial infarction, unspecified: I21.9

## 2023-03-20 HISTORY — DX: Anemia, unspecified: D64.9

## 2023-03-20 HISTORY — DX: Depression, unspecified: F32.A

## 2023-03-20 LAB — URINALYSIS, ROUTINE W REFLEX MICROSCOPIC
Bacteria, UA: NONE SEEN
Bilirubin Urine: NEGATIVE
Glucose, UA: NEGATIVE mg/dL
Ketones, ur: NEGATIVE mg/dL
Nitrite: NEGATIVE
Protein, ur: NEGATIVE mg/dL
Specific Gravity, Urine: 1.023 (ref 1.005–1.030)
pH: 5 (ref 5.0–8.0)

## 2023-03-20 LAB — TYPE AND SCREEN
ABO/RH(D): A POS
Antibody Screen: NEGATIVE

## 2023-03-20 LAB — SURGICAL PCR SCREEN
MRSA, PCR: NEGATIVE
Staphylococcus aureus: NEGATIVE

## 2023-03-20 NOTE — Patient Instructions (Addendum)
Your procedure is scheduled on: 03/31/23 - Monday Report to the Registration Desk on the 1st floor of the [facility]. To find out your arrival time, please call [phone] between 1PM - 3PM on: 03/28/23 - Friday If your arrival time is 6:00 am, do not arrive before that time as the [facility] entrance doors do not open until 6:00 am.  REMEMBER: Instructions that are not followed completely may result in serious medical risk, up to and including death; or upon the discretion of your surgeon and anesthesiologist your surgery may need to be rescheduled.  Do not eat food after midnight the night before surgery.  No gum chewing or hard candies.  You may however, drink water up to 2 hours before you are scheduled to arrive for your surgery. Do not drink anything within 2 hours of your scheduled arrival time.  In addition, your doctor has ordered for you to drink the provided:  Gatorade G2 Drinking this carbohydrate drink up to two hours before surgery helps to reduce insulin resistance and improve patient outcomes. Please complete drinking 2 hours before scheduled arrival time.  One week prior to surgery:  - Stop Anti-inflammatories (NSAIDS) such as Advil, Aleve, Ibuprofen, Motrin, Naproxen, Naprosyn and Aspirin based products such as Excedrin, Goody's Powder, BC Powder. You may however, continue to take Tylenol if needed for pain up until the day of surgery.  - Stop ANY OVER THE COUNTER supplements until after surgery.  - HOLD metFORMIN (GLUCOPHAGE-XR) beginning 03/29/23.    ON THE DAY OF SURGERY ONLY TAKE THESE MEDICATIONS WITH SIPS OF WATER:  carvedilol (COREG)  citalopram (CELEXA)  ezetimibe (ZETIA)  omeprazole (PRILOSEC) rosuvastatin (CRESTOR)   Use inhalers albuterol (VENTOLIN HFA) on the day of surgery and bring to the hospital.   No Alcohol for 24 hours before or after surgery.  No Smoking including e-cigarettes for 24 hours before surgery.  No chewable tobacco  products for at least 6 hours before surgery.  No nicotine patches on the day of surgery.  Do not use any "recreational" drugs for at least a week (preferably 2 weeks) before your surgery.  Please be advised that the combination of cocaine and anesthesia may have negative outcomes, up to and including death. If you test positive for cocaine, your surgery will be cancelled.  On the morning of surgery brush your teeth with toothpaste and water, you may rinse your mouth with mouthwash if you wish. Do not swallow any toothpaste or mouthwash.  Use CHG Soap or wipes as directed on instruction sheet.  Do not wear jewelry, make-up, hairpins, clips or nail polish.  For welded (permanent) jewelry: bracelets, anklets, waist bands, etc.  Please have this removed prior to surgery.  If it is not removed, there is a chance that hospital personnel will need to cut it off on the day of surgery.  Do not wear lotions, powders, or perfumes.   Do not shave body hair from the neck down 48 hours before surgery.  Contact lenses, hearing aids and dentures may not be worn into surgery.  Do not bring valuables to the hospital. [facility] is not responsible for any missing/lost belongings or valuables.   Notify your doctor if there is any change in your medical condition (cold, fever, infection).  Wear comfortable clothing (specific to your surgery type) to the hospital.  After surgery, you can help prevent lung complications by doing breathing exercises.  Take deep breaths and cough every 1-2 hours. Your doctor may order  a device called an Incentive Spirometer to help you take deep breaths. When coughing or sneezing, hold a pillow firmly against your incision with both hands. This is called "splinting." Doing this helps protect your incision. It also decreases belly discomfort.  If you are being admitted to the hospital overnight, leave your suitcase in the car. After surgery it may be brought to your  room.  In case of increased patient census, it may be necessary for you, the patient, to continue your postoperative care in the Same Day Surgery department.  If you are being discharged the day of surgery, you will not be allowed to drive home. You will need a responsible individual to drive you home and stay with you for 24 hours after surgery.   If you are taking public transportation, you will need to have a responsible individual with you.  Please call the Pre-admissions Testing Dept. at [phone] if you have any questions about these instructions.  Surgery Visitation Policy:  Patients having surgery or a procedure may have two visitors.  Children under the age of 24 must have an adult with them who is not the patient.  Inpatient Visitation:    Visiting hours are 7 a.m. to 8 p.m. Up to four visitors are allowed at one time in a patient room. The visitors may rotate out with other people during the day.  One visitor age 86 or older may stay with the patient overnight and must be in the room by 8 p.m.    Pre-operative 5 CHG Bath Instructions   You can play a key role in reducing the risk of infection after surgery. Your skin needs to be as free of germs as possible. You can reduce the number of germs on your skin by washing with CHG (chlorhexidine gluconate) soap before surgery. CHG is an antiseptic soap that kills germs and continues to kill germs even after washing.   DO NOT use if you have an allergy to chlorhexidine/CHG or antibacterial soaps. If your skin becomes reddened or irritated, stop using the CHG and notify one of our RNs at [phone].   Please shower with the CHG soap starting 4 days before surgery using the following schedule: 10/17 - 10/21.    Please keep in mind the following:  DO NOT shave, including legs and underarms, starting the day of your first shower.   You may shave your face at any point before/day of surgery.  Place clean sheets on your bed  the day you start using CHG soap. Use a clean washcloth (not used since being washed) for each shower. DO NOT sleep with pets once you start using the CHG.   CHG Shower Instructions:  If you choose to wash your hair and private area, wash first with your normal shampoo/soap.  After you use shampoo/soap, rinse your hair and body thoroughly to remove shampoo/soap residue.  Turn the water OFF and apply about 3 tablespoons (45 ml) of CHG soap to a CLEAN washcloth.  Apply CHG soap ONLY FROM YOUR NECK DOWN TO YOUR TOES (washing for 3-5 minutes)  DO NOT use CHG soap on face, private areas, open wounds, or sores.  Pay special attention to the area where your surgery is being performed.  If you are having back surgery, having someone wash your back for you may be helpful. Wait 2 minutes after CHG soap is applied, then you may rinse off the CHG soap.  Pat dry with a clean towel  Put on  clean clothes/pajamas   If you choose to wear lotion, please use ONLY the CHG-compatible lotions on the back of this paper.     Additional instructions for the day of surgery: DO NOT APPLY any lotions, deodorants, cologne, or perfumes.   Put on clean/comfortable clothes.  Brush your teeth.  Ask your nurse before applying any prescription medications to the skin.      CHG Compatible Lotions   Aveeno Moisturizing lotion  Cetaphil Moisturizing Cream  Cetaphil Moisturizing Lotion  Clairol Herbal Essence Moisturizing Lotion, Dry Skin  Clairol Herbal Essence Moisturizing Lotion, Extra Dry Skin  Clairol Herbal Essence Moisturizing Lotion, Normal Skin  Curel Age Defying Therapeutic Moisturizing Lotion with Alpha Hydroxy  Curel Extreme Care Body Lotion  Curel Soothing Hands Moisturizing Hand Lotion  Curel Therapeutic Moisturizing Cream, Fragrance-Free  Curel Therapeutic Moisturizing Lotion, Fragrance-Free  Curel Therapeutic Moisturizing Lotion, Original Formula  Eucerin Daily Replenishing Lotion  Eucerin Dry  Skin Therapy Plus Alpha Hydroxy Crme  Eucerin Dry Skin Therapy Plus Alpha Hydroxy Lotion  Eucerin Original Crme  Eucerin Original Lotion  Eucerin Plus Crme Eucerin Plus Lotion  Eucerin TriLipid Replenishing Lotion  Keri Anti-Bacterial Hand Lotion  Keri Deep Conditioning Original Lotion Dry Skin Formula Softly Scented  Keri Deep Conditioning Original Lotion, Fragrance Free Sensitive Skin Formula  Keri Lotion Fast Absorbing Fragrance Free Sensitive Skin Formula  Keri Lotion Fast Absorbing Softly Scented Dry Skin Formula  Keri Original Lotion  Keri Skin Renewal Lotion Keri Silky Smooth Lotion  Keri Silky Smooth Sensitive Skin Lotion  Nivea Body Creamy Conditioning Oil  Nivea Body Extra Enriched Lotion  Nivea Body Original Lotion  Nivea Body Sheer Moisturizing Lotion Nivea Crme  Nivea Skin Firming Lotion  NutraDerm 30 Skin Lotion  NutraDerm Skin Lotion  NutraDerm Therapeutic Skin Cream  NutraDerm Therapeutic Skin Lotion  ProShield Protective Hand Cream  Provon moisturizing lotion  How to Use an Incentive Spirometer  An incentive spirometer is a tool that measures how well you are filling your lungs with each breath. Learning to take long, deep breaths using this tool can help you keep your lungs clear and active. This may help to reverse or lessen your chance of developing breathing (pulmonary) problems, especially infection. You may be asked to use a spirometer: After a surgery. If you have a lung problem or a history of smoking. After a long period of time when you have been unable to move or be active. If the spirometer includes an indicator to show the highest number that you have reached, your health care provider or respiratory therapist will help you set a goal. Keep a log of your progress as told by your health care provider. What are the risks? Breathing too quickly may cause dizziness or cause you to pass out. Take your time so you do not get dizzy or light-headed. If you  are in pain, you may need to take pain medicine before doing incentive spirometry. It is harder to take a deep breath if you are having pain. How to use your incentive spirometer  Sit up on the edge of your bed or on a chair. Hold the incentive spirometer so that it is in an upright position. Before you use the spirometer, breathe out normally. Place the mouthpiece in your mouth. Make sure your lips are closed tightly around it. Breathe in slowly and as deeply as you can through your mouth, causing the piston or the ball to rise toward the top of the chamber. Hold your breath  for 3-5 seconds, or for as long as possible. If the spirometer includes a coach indicator, use this to guide you in breathing. Slow down your breathing if the indicator goes above the marked areas. Remove the mouthpiece from your mouth and breathe out normally. The piston or ball will return to the bottom of the chamber. Rest for a few seconds, then repeat the steps 10 or more times. Take your time and take a few normal breaths between deep breaths so that you do not get dizzy or light-headed. Do this every 1-2 hours when you are awake. If the spirometer includes a goal marker to show the highest number you have reached (best effort), use this as a goal to work toward during each repetition. After each set of 10 deep breaths, cough a few times. This will help to make sure that your lungs are clear. If you have an incision on your chest or abdomen from surgery, place a pillow or a rolled-up towel firmly against the incision when you cough. This can help to reduce pain while taking deep breaths and coughing. General tips When you are able to get out of bed: Walk around often. Continue to take deep breaths and cough in order to clear your lungs. Keep using the incentive spirometer until your health care provider says it is okay to stop using it. If you have been in the hospital, you may be told to keep using the spirometer at  home. Contact a health care provider if: You are having difficulty using the spirometer. You have trouble using the spirometer as often as instructed. Your pain medicine is not giving enough relief for you to use the spirometer as told. You have a fever. Get help right away if: You develop shortness of breath. You develop a cough with bloody mucus from the lungs. You have fluid or blood coming from an incision site after you cough. Summary An incentive spirometer is a tool that can help you learn to take long, deep breaths to keep your lungs clear and active. You may be asked to use a spirometer after a surgery, if you have a lung problem or a history of smoking, or if you have been inactive for a long period of time. Use your incentive spirometer as instructed every 1-2 hours while you are awake. If you have an incision on your chest or abdomen, place a pillow or a rolled-up towel firmly against your incision when you cough. This will help to reduce pain. Get help right away if you have shortness of breath, you cough up bloody mucus, or blood comes from your incision when you cough. This information is not intended to replace advice given to you by your health care provider. Make sure you discuss any questions you have with your health care provider.      Class available 03/26/23 :

## 2023-03-26 ENCOUNTER — Other Ambulatory Visit: Payer: Self-pay | Admitting: Family Medicine

## 2023-03-26 DIAGNOSIS — E1129 Type 2 diabetes mellitus with other diabetic kidney complication: Secondary | ICD-10-CM

## 2023-03-30 ENCOUNTER — Other Ambulatory Visit: Payer: Self-pay | Admitting: Family Medicine

## 2023-03-30 DIAGNOSIS — G4700 Insomnia, unspecified: Secondary | ICD-10-CM

## 2023-03-31 ENCOUNTER — Ambulatory Visit: Payer: Medicare Other

## 2023-03-31 ENCOUNTER — Other Ambulatory Visit: Payer: Self-pay

## 2023-03-31 ENCOUNTER — Encounter
Admission: RE | Disposition: A | Discharge status: Home / Self Care | Payer: Self-pay | Source: Home / Self Care | Attending: Orthopedic Surgery

## 2023-03-31 ENCOUNTER — Observation Stay
Admission: RE | Admit: 2023-03-31 | Discharge: 2023-04-01 | Disposition: A | Discharge status: Home / Self Care | Payer: Medicare Other | Attending: Orthopedic Surgery | Admitting: Orthopedic Surgery

## 2023-03-31 ENCOUNTER — Encounter: Payer: Self-pay | Admitting: Orthopedic Surgery

## 2023-03-31 ENCOUNTER — Ambulatory Visit: Payer: Medicare Other | Admitting: Registered Nurse

## 2023-03-31 ENCOUNTER — Ambulatory Visit: Payer: Medicare Other | Admitting: Urgent Care

## 2023-03-31 DIAGNOSIS — I2510 Atherosclerotic heart disease of native coronary artery without angina pectoris: Secondary | ICD-10-CM | POA: Diagnosis not present

## 2023-03-31 DIAGNOSIS — I10 Essential (primary) hypertension: Secondary | ICD-10-CM | POA: Diagnosis not present

## 2023-03-31 DIAGNOSIS — E119 Type 2 diabetes mellitus without complications: Secondary | ICD-10-CM | POA: Insufficient documentation

## 2023-03-31 DIAGNOSIS — Z7984 Long term (current) use of oral hypoglycemic drugs: Secondary | ICD-10-CM | POA: Diagnosis not present

## 2023-03-31 DIAGNOSIS — Z79899 Other long term (current) drug therapy: Secondary | ICD-10-CM | POA: Diagnosis not present

## 2023-03-31 DIAGNOSIS — Z7982 Long term (current) use of aspirin: Secondary | ICD-10-CM | POA: Diagnosis not present

## 2023-03-31 DIAGNOSIS — M1612 Unilateral primary osteoarthritis, left hip: Secondary | ICD-10-CM | POA: Diagnosis not present

## 2023-03-31 DIAGNOSIS — J45909 Unspecified asthma, uncomplicated: Secondary | ICD-10-CM | POA: Insufficient documentation

## 2023-03-31 DIAGNOSIS — Z96642 Presence of left artificial hip joint: Principal | ICD-10-CM | POA: Diagnosis present

## 2023-03-31 HISTORY — PX: TOTAL HIP ARTHROPLASTY: SHX124

## 2023-03-31 LAB — GLUCOSE, CAPILLARY
Glucose-Capillary: 167 mg/dL — ABNORMAL HIGH (ref 70–99)
Glucose-Capillary: 176 mg/dL — ABNORMAL HIGH (ref 70–99)
Glucose-Capillary: 246 mg/dL — ABNORMAL HIGH (ref 70–99)
Glucose-Capillary: 188 mg/dL — ABNORMAL HIGH (ref 70–99)
Glucose-Capillary: 175 mg/dL — ABNORMAL HIGH (ref 70–99)

## 2023-03-31 LAB — ABO/RH: ABO/RH(D): A POS

## 2023-03-31 SURGERY — ARTHROPLASTY, HIP, TOTAL, ANTERIOR APPROACH
Anesthesia: Spinal | Site: Hip | Laterality: Left

## 2023-03-31 MED ORDER — PANTOPRAZOLE SODIUM 40 MG PO TBEC
40.0000 mg | DELAYED_RELEASE_TABLET | Freq: Every day | ORAL | Status: DC
  Administered 2023-04-01: 40 mg via ORAL

## 2023-03-31 MED ORDER — BUPIVACAINE HCL (PF) 0.5 % IJ SOLN
INTRAMUSCULAR | Status: DC | PRN
  Administered 2023-03-31: 2.5 mL via INTRATHECAL

## 2023-03-31 MED ORDER — TRAMADOL HCL 50 MG PO TABS
ORAL_TABLET | ORAL | Status: AC
  Filled 2023-03-31: qty 1

## 2023-03-31 MED ORDER — KETOROLAC TROMETHAMINE 15 MG/ML IJ SOLN
INTRAMUSCULAR | Status: AC
  Filled 2023-03-31: qty 1

## 2023-03-31 MED ORDER — OXYCODONE HCL 5 MG PO TABS
5.0000 mg | ORAL_TABLET | ORAL | Status: AC
  Administered 2023-03-31: 5 mg via ORAL

## 2023-03-31 MED ORDER — INSULIN ASPART 100 UNIT/ML IJ SOLN
INTRAMUSCULAR | Status: AC
  Filled 2023-03-31: qty 1

## 2023-03-31 MED ORDER — PROPOFOL 10 MG/ML IV BOLUS
INTRAVENOUS | Status: AC
  Filled 2023-03-31: qty 20

## 2023-03-31 MED ORDER — BUPIVACAINE LIPOSOME 1.3 % IJ SUSP
INTRAMUSCULAR | Status: AC
  Filled 2023-03-31: qty 20

## 2023-03-31 MED ORDER — LOSARTAN POTASSIUM 50 MG PO TABS
25.0000 mg | ORAL_TABLET | Freq: Every day | ORAL | Status: DC
  Administered 2023-04-01: 25 mg via ORAL

## 2023-03-31 MED ORDER — CHLORHEXIDINE GLUCONATE 0.12 % MT SOLN
OROMUCOSAL | Status: AC
  Filled 2023-03-31: qty 15

## 2023-03-31 MED ORDER — FENTANYL CITRATE (PF) 100 MCG/2ML IJ SOLN: 25.0000 ug | INTRAMUSCULAR | Status: DC | PRN

## 2023-03-31 MED ORDER — SODIUM CHLORIDE 0.9 % IV SOLN: INTRAVENOUS | Status: DC

## 2023-03-31 MED ORDER — CEFAZOLIN SODIUM-DEXTROSE 2-4 GM/100ML-% IV SOLN
INTRAVENOUS | Status: AC
  Filled 2023-03-31: qty 100

## 2023-03-31 MED ORDER — METOCLOPRAMIDE HCL 5 MG PO TABS: 5.0000 mg | ORAL_TABLET | Freq: Three times a day (TID) | ORAL | Status: DC | PRN

## 2023-03-31 MED ORDER — BUPIVACAINE-EPINEPHRINE (PF) 0.25% -1:200000 IJ SOLN
INTRAMUSCULAR | Status: AC
  Filled 2023-03-31: qty 30

## 2023-03-31 MED ORDER — DEXAMETHASONE SODIUM PHOSPHATE 10 MG/ML IJ SOLN
8.0000 mg | Freq: Once | INTRAMUSCULAR | Status: AC
  Administered 2023-03-31: 10 mg via INTRAVENOUS

## 2023-03-31 MED ORDER — ACETAMINOPHEN 10 MG/ML IV SOLN
INTRAVENOUS | Status: AC
  Filled 2023-03-31: qty 100

## 2023-03-31 MED ORDER — ONDANSETRON HCL 4 MG/2ML IJ SOLN
INTRAMUSCULAR | Status: DC | PRN
  Administered 2023-03-31: 4 mg via INTRAVENOUS

## 2023-03-31 MED ORDER — TRANEXAMIC ACID-NACL 1000-0.7 MG/100ML-% IV SOLN
INTRAVENOUS | Status: AC
  Filled 2023-03-31: qty 100

## 2023-03-31 MED ORDER — INSULIN ASPART 100 UNIT/ML IJ SOLN: 0.0000 [IU] | Freq: Every day | INTRAMUSCULAR | Status: DC

## 2023-03-31 MED ORDER — OXYCODONE HCL 5 MG PO TABS
ORAL_TABLET | ORAL | Status: AC
  Filled 2023-03-31: qty 1

## 2023-03-31 MED ORDER — ENOXAPARIN SODIUM 40 MG/0.4ML IJ SOSY
40.0000 mg | PREFILLED_SYRINGE | INTRAMUSCULAR | Status: DC
  Administered 2023-04-01: 40 mg via SUBCUTANEOUS

## 2023-03-31 MED ORDER — CARVEDILOL 3.125 MG PO TABS
3.1250 mg | ORAL_TABLET | Freq: Two times a day (BID) | ORAL | Status: DC
  Administered 2023-03-31 – 2023-04-01 (×2): 3.125 mg via ORAL
  Filled 2023-03-31: qty 1

## 2023-03-31 MED ORDER — ONDANSETRON HCL 4 MG/2ML IJ SOLN
INTRAMUSCULAR | Status: AC
  Filled 2023-03-31: qty 2

## 2023-03-31 MED ORDER — TRAMADOL HCL 50 MG PO TABS
50.0000 mg | ORAL_TABLET | Freq: Four times a day (QID) | ORAL | Status: DC | PRN
  Administered 2023-03-31 – 2023-04-01 (×3): 50 mg via ORAL

## 2023-03-31 MED ORDER — HYDROCODONE-ACETAMINOPHEN 5-325 MG PO TABS
ORAL_TABLET | ORAL | Status: AC
  Filled 2023-03-31: qty 2

## 2023-03-31 MED ORDER — PROPOFOL 10 MG/ML IV BOLUS
INTRAVENOUS | Status: DC | PRN
  Administered 2023-03-31: 30 mg via INTRAVENOUS
  Administered 2023-03-31: 50 mg via INTRAVENOUS
  Administered 2023-03-31: 20 mg via INTRAVENOUS

## 2023-03-31 MED ORDER — BUPIVACAINE HCL (PF) 0.5 % IJ SOLN
INTRAMUSCULAR | Status: AC
  Filled 2023-03-31: qty 10

## 2023-03-31 MED ORDER — ZOLPIDEM TARTRATE 5 MG PO TABS
ORAL_TABLET | ORAL | Status: AC
  Filled 2023-03-31: qty 1

## 2023-03-31 MED ORDER — SODIUM CHLORIDE 0.9 % IR SOLN
Status: DC | PRN
  Administered 2023-03-31: 500 mL

## 2023-03-31 MED ORDER — FENTANYL CITRATE (PF) 100 MCG/2ML IJ SOLN
INTRAMUSCULAR | Status: AC
  Filled 2023-03-31: qty 2

## 2023-03-31 MED ORDER — EZETIMIBE 10 MG PO TABS
10.0000 mg | ORAL_TABLET | Freq: Every day | ORAL | Status: DC
  Administered 2023-04-01: 10 mg via ORAL

## 2023-03-31 MED ORDER — ONDANSETRON HCL 4 MG/2ML IJ SOLN: 4.0000 mg | Freq: Four times a day (QID) | INTRAMUSCULAR | Status: DC | PRN

## 2023-03-31 MED ORDER — EPHEDRINE 5 MG/ML INJ
INTRAVENOUS | Status: AC
  Filled 2023-03-31: qty 5

## 2023-03-31 MED ORDER — HYDROCODONE-ACETAMINOPHEN 5-325 MG PO TABS
1.0000 | ORAL_TABLET | ORAL | Status: DC | PRN
  Administered 2023-03-31: 2 via ORAL

## 2023-03-31 MED ORDER — INSULIN ASPART 100 UNIT/ML IJ SOLN
0.0000 [IU] | Freq: Three times a day (TID) | INTRAMUSCULAR | Status: DC
  Administered 2023-03-31: 3 [IU] via SUBCUTANEOUS
  Administered 2023-03-31: 5 [IU] via SUBCUTANEOUS
  Administered 2023-04-01: 2 [IU] via SUBCUTANEOUS

## 2023-03-31 MED ORDER — EPHEDRINE SULFATE-NACL 50-0.9 MG/10ML-% IV SOSY
PREFILLED_SYRINGE | INTRAVENOUS | Status: DC | PRN
  Administered 2023-03-31: 10 mg via INTRAVENOUS
  Administered 2023-03-31: 5 mg via INTRAVENOUS
  Administered 2023-03-31: 10 mg via INTRAVENOUS

## 2023-03-31 MED ORDER — ROSUVASTATIN CALCIUM 20 MG PO TABS
40.0000 mg | ORAL_TABLET | Freq: Every day | ORAL | Status: DC
  Administered 2023-04-01: 40 mg via ORAL

## 2023-03-31 MED ORDER — PHENOL 1.4 % MT LIQD: 1.0000 | OROMUCOSAL | Status: DC | PRN

## 2023-03-31 MED ORDER — PHENYLEPHRINE 80 MCG/ML (10ML) SYRINGE FOR IV PUSH (FOR BLOOD PRESSURE SUPPORT)
PREFILLED_SYRINGE | INTRAVENOUS | Status: AC
  Filled 2023-03-31: qty 10

## 2023-03-31 MED ORDER — LIDOCAINE HCL (CARDIAC) PF 100 MG/5ML IV SOSY
PREFILLED_SYRINGE | INTRAVENOUS | Status: DC | PRN
  Administered 2023-03-31: 10 mg via INTRAVENOUS

## 2023-03-31 MED ORDER — SURGIRINSE WOUND IRRIGATION SYSTEM - OPTIME: TOPICAL | Status: DC | PRN

## 2023-03-31 MED ORDER — PHENYLEPHRINE HCL (PRESSORS) 10 MG/ML IV SOLN
INTRAVENOUS | Status: DC | PRN
  Administered 2023-03-31: 160 ug via INTRAVENOUS
  Administered 2023-03-31: 80 ug via INTRAVENOUS
  Administered 2023-03-31 (×6): 160 ug via INTRAVENOUS
  Administered 2023-03-31: 80 ug via INTRAVENOUS

## 2023-03-31 MED ORDER — ORAL CARE MOUTH RINSE: 15.0000 mL | Freq: Once | OROMUCOSAL | Status: AC

## 2023-03-31 MED ORDER — FENTANYL CITRATE (PF) 100 MCG/2ML IJ SOLN
INTRAMUSCULAR | Status: DC | PRN
  Administered 2023-03-31: 25 ug via INTRAVENOUS

## 2023-03-31 MED ORDER — CEFAZOLIN SODIUM-DEXTROSE 2-4 GM/100ML-% IV SOLN
2.0000 g | Freq: Four times a day (QID) | INTRAVENOUS | Status: AC
  Administered 2023-03-31 (×2): 2 g via INTRAVENOUS

## 2023-03-31 MED ORDER — 0.9 % SODIUM CHLORIDE (POUR BTL) OPTIME
TOPICAL | Status: DC | PRN
  Administered 2023-03-31: 500 mL

## 2023-03-31 MED ORDER — ACETAMINOPHEN 500 MG PO TABS
1000.0000 mg | ORAL_TABLET | Freq: Three times a day (TID) | ORAL | Status: DC
  Administered 2023-04-01 (×2): 1000 mg via ORAL

## 2023-03-31 MED ORDER — DEXAMETHASONE SODIUM PHOSPHATE 10 MG/ML IJ SOLN
INTRAMUSCULAR | Status: AC
  Filled 2023-03-31: qty 1

## 2023-03-31 MED ORDER — CEFAZOLIN SODIUM-DEXTROSE 2-4 GM/100ML-% IV SOLN
2.0000 g | INTRAVENOUS | Status: AC
  Administered 2023-03-31: 2 g via INTRAVENOUS

## 2023-03-31 MED ORDER — DOCUSATE SODIUM 100 MG PO CAPS
100.0000 mg | ORAL_CAPSULE | Freq: Two times a day (BID) | ORAL | Status: DC
  Administered 2023-03-31 – 2023-04-01 (×2): 100 mg via ORAL

## 2023-03-31 MED ORDER — DOCUSATE SODIUM 100 MG PO CAPS
ORAL_CAPSULE | ORAL | Status: AC
  Filled 2023-03-31: qty 1

## 2023-03-31 MED ORDER — ONDANSETRON HCL 4 MG PO TABS: 4.0000 mg | ORAL_TABLET | Freq: Four times a day (QID) | ORAL | Status: DC | PRN

## 2023-03-31 MED ORDER — MIDAZOLAM HCL 5 MG/5ML IJ SOLN
INTRAMUSCULAR | Status: DC | PRN
  Administered 2023-03-31 (×2): 1 mg via INTRAVENOUS

## 2023-03-31 MED ORDER — PHENYLEPHRINE HCL-NACL 20-0.9 MG/250ML-% IV SOLN
INTRAVENOUS | Status: DC | PRN
  Administered 2023-03-31: 20 ug/min via INTRAVENOUS

## 2023-03-31 MED ORDER — ACETAMINOPHEN 500 MG PO TABS: 1000.0000 mg | ORAL_TABLET | Freq: Three times a day (TID) | ORAL | Status: DC

## 2023-03-31 MED ORDER — TRANEXAMIC ACID-NACL 1000-0.7 MG/100ML-% IV SOLN
1000.0000 mg | INTRAVENOUS | Status: AC
  Administered 2023-03-31 (×2): 1000 mg via INTRAVENOUS

## 2023-03-31 MED ORDER — MENTHOL 3 MG MT LOZG: 1.0000 | LOZENGE | OROMUCOSAL | Status: DC | PRN

## 2023-03-31 MED ORDER — OXYCODONE HCL 5 MG PO TABS
5.0000 mg | ORAL_TABLET | Freq: Once | ORAL | Status: AC
  Administered 2023-03-31: 5 mg via ORAL

## 2023-03-31 MED ORDER — PROPOFOL 1000 MG/100ML IV EMUL
INTRAVENOUS | Status: AC
  Filled 2023-03-31: qty 100

## 2023-03-31 MED ORDER — KETOROLAC TROMETHAMINE 15 MG/ML IJ SOLN
7.5000 mg | Freq: Four times a day (QID) | INTRAMUSCULAR | Status: AC
  Administered 2023-03-31 – 2023-04-01 (×4): 7.5 mg via INTRAVENOUS

## 2023-03-31 MED ORDER — SODIUM CHLORIDE (PF) 0.9 % IJ SOLN
INTRAMUSCULAR | Status: DC | PRN
  Administered 2023-03-31: 50 mL via INTRAMUSCULAR

## 2023-03-31 MED ORDER — CHLORHEXIDINE GLUCONATE 0.12 % MT SOLN
15.0000 mL | Freq: Once | OROMUCOSAL | Status: AC
Start: 2023-03-31 — End: 2023-03-31
  Administered 2023-03-31: 15 mL via OROMUCOSAL

## 2023-03-31 MED ORDER — MIDAZOLAM HCL 2 MG/2ML IJ SOLN
INTRAMUSCULAR | Status: AC
  Filled 2023-03-31: qty 2

## 2023-03-31 MED ORDER — ACETAMINOPHEN 10 MG/ML IV SOLN
INTRAVENOUS | Status: DC | PRN
  Administered 2023-03-31: 1000 mg via INTRAVENOUS

## 2023-03-31 MED ORDER — SODIUM CHLORIDE (PF) 0.9 % IJ SOLN
INTRAMUSCULAR | Status: AC
  Filled 2023-03-31: qty 10

## 2023-03-31 MED ORDER — METOCLOPRAMIDE HCL 5 MG/ML IJ SOLN: 5.0000 mg | Freq: Three times a day (TID) | INTRAMUSCULAR | Status: DC | PRN

## 2023-03-31 MED ORDER — ALBUTEROL SULFATE (2.5 MG/3ML) 0.083% IN NEBU: 2.5000 mg | INHALATION_SOLUTION | Freq: Four times a day (QID) | RESPIRATORY_TRACT | Status: DC | PRN

## 2023-03-31 MED ORDER — MORPHINE SULFATE (PF) 4 MG/ML IV SOLN: 0.5000 mg | INTRAVENOUS | Status: DC | PRN

## 2023-03-31 MED ORDER — CITALOPRAM HYDROBROMIDE 20 MG PO TABS
20.0000 mg | ORAL_TABLET | Freq: Every day | ORAL | Status: DC
  Administered 2023-04-01: 20 mg via ORAL
  Filled 2023-03-31: qty 1

## 2023-03-31 MED ORDER — ZOLPIDEM TARTRATE 5 MG PO TABS
5.0000 mg | ORAL_TABLET | Freq: Every evening | ORAL | Status: DC | PRN
  Administered 2023-03-31: 5 mg via ORAL

## 2023-03-31 MED ORDER — PROPOFOL 500 MG/50ML IV EMUL
INTRAVENOUS | Status: DC | PRN
  Administered 2023-03-31: 75 ug/kg/min via INTRAVENOUS

## 2023-03-31 SURGICAL SUPPLY — 71 items
ADH SKN CLS APL DERMABOND .7 (GAUZE/BANDAGES/DRESSINGS) ×1
AGENT HMST KT MTR STRL THRMB (HEMOSTASIS)
APL PRP STRL LF DISP 70% ISPRP (MISCELLANEOUS) ×1
BLADE SAGITTAL AGGR TOOTH XLG (BLADE) ×1 IMPLANT
BNDG CMPR 5X6 CHSV STRCH STRL (GAUZE/BANDAGES/DRESSINGS) ×2
BNDG COHESIVE 6X5 TAN ST LF (GAUZE/BANDAGES/DRESSINGS) ×2 IMPLANT
CHLORAPREP W/TINT 26 (MISCELLANEOUS) ×1 IMPLANT
DERMABOND ADVANCED .7 DNX12 (GAUZE/BANDAGES/DRESSINGS) ×1 IMPLANT
DRAPE C-ARM XRAY 36X54 (DRAPES) ×1 IMPLANT
DRAPE POUCH INSTRU U-SHP 10X18 (DRAPES) ×1 IMPLANT
DRAPE SHEET LG 3/4 BI-LAMINATE (DRAPES) ×3 IMPLANT
DRAPE TABLE BACK 80X90 (DRAPES) ×1 IMPLANT
DRSG MEPILEX SACRM 8.7X9.8 (GAUZE/BANDAGES/DRESSINGS) ×1 IMPLANT
DRSG OPSITE POSTOP 4X8 (GAUZE/BANDAGES/DRESSINGS) ×1 IMPLANT
ELECT BLADE 4.0 EZ CLEAN MEGAD (MISCELLANEOUS) ×1
ELECT REM PT RETURN 9FT ADLT (ELECTROSURGICAL) ×1
ELECTRODE BLDE 4.0 EZ CLN MEGD (MISCELLANEOUS) ×1 IMPLANT
ELECTRODE REM PT RTRN 9FT ADLT (ELECTROSURGICAL) ×1 IMPLANT
GLOVE BIO SURGEON STRL SZ8 (GLOVE) ×1 IMPLANT
GLOVE BIOGEL PI IND STRL 8 (GLOVE) ×1 IMPLANT
GLOVE PI ORTHO PRO STRL 7.5 (GLOVE) ×2 IMPLANT
GLOVE PI ORTHO PRO STRL SZ8 (GLOVE) ×2 IMPLANT
GLOVE SURG SYN 7.5 E (GLOVE) ×1 IMPLANT
GLOVE SURG SYN 7.5 PF PI (GLOVE) ×1 IMPLANT
GOWN SRG XL LVL 3 NONREINFORCE (GOWNS) ×1 IMPLANT
GOWN STRL NON-REIN TWL XL LVL3 (GOWNS) ×1
GOWN STRL REUS W/ TWL LRG LVL3 (GOWN DISPOSABLE) ×1 IMPLANT
GOWN STRL REUS W/ TWL XL LVL3 (GOWN DISPOSABLE) ×1 IMPLANT
GOWN STRL REUS W/TWL LRG LVL3 (GOWN DISPOSABLE) ×1
GOWN STRL REUS W/TWL XL LVL3 (GOWN DISPOSABLE) ×1
HANDLE YANKAUER SUCT OPEN TIP (MISCELLANEOUS) ×1 IMPLANT
HEAD BIOLOX HIP 36/-5 (Joint) IMPLANT
HIP BIOLOX HD 36/-5 (Joint) ×1 IMPLANT
HOOD PEEL AWAY T7 (MISCELLANEOUS) ×2 IMPLANT
INSERT 0 DEGREE 36 (Miscellaneous) IMPLANT
IV NS 100ML SINGLE PACK (IV SOLUTION) ×1 IMPLANT
KIT PATIENT CARE HANA TABLE (KITS) ×1 IMPLANT
LIGHT WAVEGUIDE WIDE FLAT (MISCELLANEOUS) ×1 IMPLANT
MANIFOLD NEPTUNE II (INSTRUMENTS) ×1 IMPLANT
MARKER SKIN DUAL TIP RULER LAB (MISCELLANEOUS) ×1 IMPLANT
MAT ABSORB FLUID 56X50 GRAY (MISCELLANEOUS) ×1 IMPLANT
NDL FILTER BLUNT 18X1 1/2 (NEEDLE) ×1 IMPLANT
NDL SAFETY ECLIPSE 18X1.5 (NEEDLE) ×1 IMPLANT
NDL SPNL 20GX3.5 QUINCKE YW (NEEDLE) ×1 IMPLANT
NEEDLE FILTER BLUNT 18X1 1/2 (NEEDLE) ×1 IMPLANT
NEEDLE SPNL 20GX3.5 QUINCKE YW (NEEDLE) ×1 IMPLANT
NS IRRIG 500ML POUR BTL (IV SOLUTION) ×1 IMPLANT
PACK HIP COMPR (MISCELLANEOUS) ×1 IMPLANT
PAD ARMBOARD 7.5X6 YLW CONV (MISCELLANEOUS) ×1 IMPLANT
SCREW HEX LP 6.5X20 (Screw) IMPLANT
SCREW HEX LP 6.5X30 (Screw) IMPLANT
SHELL ACETAB TRIDENT 48 (Shell) IMPLANT
SLEEVE SCD COMPRESS KNEE MED (STOCKING) ×1 IMPLANT
SOLUTION IRRIG SURGIPHOR (IV SOLUTION) ×1 IMPLANT
STEM STD OFFSET SZ5 36 (Stem) IMPLANT
SURGIFLO W/THROMBIN 8M KIT (HEMOSTASIS) IMPLANT
SUT BONE WAX W31G (SUTURE) ×1 IMPLANT
SUT DVC 2 QUILL PDO T11 36X36 (SUTURE) ×1 IMPLANT
SUT ETHIBOND 2 V 37 (SUTURE) ×1 IMPLANT
SUT QUILL MONODERM 3-0 PS-2 (SUTURE) ×1 IMPLANT
SUT SILK 0 (SUTURE) ×1
SUT SILK 0 30XBRD TIE 6 (SUTURE) ×1 IMPLANT
SUT VIC AB 0 CT1 36 (SUTURE) ×1 IMPLANT
SUT VIC AB 2-0 CT2 27 (SUTURE) ×2 IMPLANT
SYR 30ML LL (SYRINGE) ×2 IMPLANT
SYR TB 1ML LL NO SAFETY (SYRINGE) ×1 IMPLANT
TAPE MICROFOAM 4IN (TAPE) IMPLANT
TOWEL OR 17X26 4PK STRL BLUE (TOWEL DISPOSABLE) IMPLANT
TRAP FLUID SMOKE EVACUATOR (MISCELLANEOUS) ×1 IMPLANT
WAND WEREWOLF FASTSEAL 6.0 (MISCELLANEOUS) ×1 IMPLANT
WATER STERILE IRR 1000ML POUR (IV SOLUTION) ×1 IMPLANT

## 2023-03-31 NOTE — Anesthesia Preprocedure Evaluation (Signed)
Anesthesia Evaluation  Patient identified by MRN, date of birth, ID band Patient awake    Reviewed: Allergy & Precautions, H&P , NPO status , Patient's Chart, lab work & pertinent test results, reviewed documented beta blocker date and time   History of Anesthesia Complications Negative for: history of anesthetic complications  Airway Mallampati: II  TM Distance: >3 FB Neck ROM: full    Dental  (+) Dental Advidsory Given, Edentulous Upper, Edentulous Lower   Pulmonary neg pulmonary ROS, Continuous Positive Airway Pressure Ventilation    Pulmonary exam normal breath sounds clear to auscultation       Cardiovascular Exercise Tolerance: Good (-) hypertension(-) angina + CAD and + Past MI  (-) Cardiac Stents Normal cardiovascular exam(-) dysrhythmias  Rhythm:regular Rate:Normal     Neuro/Psych  PSYCHIATRIC DISORDERS Anxiety Depression    negative neurological ROS     GI/Hepatic Neg liver ROS,GERD  ,,  Endo/Other  diabetes    Renal/GU negative Renal ROS  negative genitourinary   Musculoskeletal   Abdominal   Peds  Hematology negative hematology ROS (+)   Anesthesia Other Findings Past Medical History: No date: Anemia No date: Anxiety No date: Arthritis No date: Asthma No date: Coronary artery disease No date: Depression No date: Diabetes mellitus without complication (HCC) No date: GERD (gastroesophageal reflux disease) No date: Hyperlipidemia No date: Hypertension No date: Insomnia No date: Myocardial infarction (HCC)   Reproductive/Obstetrics negative OB ROS                              Anesthesia Physical Anesthesia Plan  ASA: 2  Anesthesia Plan: Spinal   Post-op Pain Management:    Induction: Intravenous  PONV Risk Score and Plan: 2 and Propofol infusion and TIVA  Airway Management Planned: Natural Airway and Simple Face Mask  Additional Equipment:   Intra-op Plan:    Post-operative Plan:   Informed Consent: I have reviewed the patients History and Physical, chart, labs and discussed the procedure including the risks, benefits and alternatives for the proposed anesthesia with the patient or authorized representative who has indicated his/her understanding and acceptance.     Dental Advisory Given  Plan Discussed with: Anesthesiologist, CRNA and Surgeon  Anesthesia Plan Comments:          Anesthesia Quick Evaluation

## 2023-03-31 NOTE — Discharge Instructions (Signed)
Instructions after Anterior Total Hip Replacement        Dr. Serita Butcher., M.D.      Dept. of [facility]  [address] [place], [place]  [address]  Phone: [phone]   Fax: [phone]    DIET: Drink plenty of non-alcoholic fluids. Resume your normal diet. Include foods high in fiber.  ACTIVITY:  You may use crutches or a walker with weight-bearing as tolerated, unless instructed otherwise. You may be weaned off of the walker or crutches by your Physical Therapist.  Continue doing gentle exercises. Exercising will reduce the pain and swelling, increase motion, and prevent muscle weakness.   Please continue to use the TED compression stockings for 2 weeks. You may remove the stockings at night, but should reapply them in the morning. Do not drive or operate any equipment until instructed.  WOUND CARE:  Continue to use ice packs periodically to reduce pain and swelling. You may shower with honeycomb dressing 3 days after your surgery. Do not submerge incision site under water. Remove honeycomb dressing 7 days after surgery and allow dermabond to fall off on its own.   MEDICATIONS: You may resume your regular medications. Please take the pain medication as prescribed on the medication list. Do not take pain medication on an empty stomach. You have been given a prescription for a blood thinner to prevent blood clots. Please take the medication as instructed. (NOTE: After completing a 2 week course of Lovenox, take one Enteric-coated 81 mg aspirin twice a day for 3 additional weeks.) Pain medications and iron supplements can cause constipation. Use a stool softener (Senokot or Colace) on a daily basis and a laxative (dulcolax or miralax) as needed. Do not drive or drink alcoholic beverages when taking pain medications.  POSTOPERATIVE CONSTIPATION PROTOCOL Constipation - defined medically as fewer than three stools per week and  severe constipation as less than one stool per week.  One of the most common issues patients have following surgery is constipation.  Even if you have a regular bowel pattern at home, your normal regimen is likely to be disrupted due to multiple reasons following surgery.  Combination of anesthesia, postoperative narcotics, change in appetite and fluid intake all can affect your bowels.  In order to avoid complications following surgery, here are some recommendations in order to help you during your recovery period.  Colace (docusate) - Pick up an over-the-counter form of Colace or another stool softener and take twice a day as long as you are requiring postoperative pain medications.  Take with a full glass of water daily.  If you experience loose stools or diarrhea, hold the colace until you stool forms back up.  If your symptoms do not get better within 1 week or if they get worse, check with your doctor.  Dulcolax (bisacodyl) - Pick up over-the-counter and take as directed by the product packaging as needed to assist with the movement of your bowels.  Take with a full glass of water.  Use this product as needed if not relieved by Colace only.   MiraLax (polyethylene glycol) - Pick up over-the-counter to have on hand.  MiraLax is a solution that will increase the amount of water in your bowels to assist with bowel movements.  Take as directed and can mix with a glass of water, juice, soda, coffee, or tea.  Take if you go more than two days without a movement. Do not use MiraLax more than  once per day. Call your doctor if you are still constipated or irregular after using this medication for 7 days in a row.  If you continue to have problems with postoperative constipation, please contact the office for further assistance and recommendations.  If you experience "the worst abdominal pain ever" or develop nausea or vomiting, please contact the office immediatly for further recommendations for  treatment.   CALL THE OFFICE FOR: Temperature above 101 degrees Excessive bleeding or drainage on the dressing. Excessive swelling, coldness, or paleness of the toes. Persistent nausea and vomiting.  FOLLOW-UP:  You should have an appointment to return to the office in 2 weeks after surgery. Arrangements have been made for continuation of Physical Therapy (either home therapy or outpatient therapy).

## 2023-03-31 NOTE — Op Note (Signed)
Patient Name: Dawn  Werner:[id number]  Pre-Operative Diagnosis: Left hip Osteoarthritis  Post-Operative Diagnosis: (same)  Procedure: Left Total Hip Arthroplasty  Components/Implants: Cup: Trident Tritanium Clusterhole 39mm/D w/ x2 screws    Liner: Neutral X3 poly 36/D  Stem: Insignia #5 std offset  Head:45mm -5mm biolox ceramic  Date of Surgery: 03/31/2023  Surgeon: Reinaldo Berber MD  Assistant: Amador Cunas PA (present and scrubbed throughout the case, critical for assistance with exposure, retraction, instrumentation, and closure), Minor PAS   Anesthesiologist: Karlton Lemon  Anesthesia: Spinal   EBL: 200cc  IVF:600cc  Complications: None   Brief history: The patient is a 72 year old female with a history of osteoarthritis of the left hip with pain limiting their range of motion and activities of daily living, which has failed multiple attempts at conservative therapy.  The risks and benefits of total hip arthroplasty as definitive surgical treatment were discussed with the patient, who opted to proceed with the operation.  After outpatient medical clearance and optimization was completed the patient was admitted to [facility] for the procedure.  All preoperative films were reviewed and an appropriate surgical plan was made prior to surgery.   Description of procedure: The patient was brought to the operating room where laterality was confirmed by all those present to be the left side.  The patient was administered spinal anesthesia on a stretcher prior to being moved supine on the operating room table. Patient was given an intravenous dose of antibiotics for surgical prophylaxis and TXA.  All bony prominences and extremities were well padded and the patient was securely attached to the table boots, a perineal post was placed and the patient had a safety strap placed.  Surgical site was prepped with alcohol and chlorhexidine. The surgical site over the hip  was and draped in typical sterile fashion with multiple layers of adhesive and nonadhesive drapes.  The incision site was marked out with a sterile marker and care was taken to assess the position of the ASIS and ensure appropriate position for the incision.    A surgical timeout was then called with participation of all staff in the room the patient was then a confirmed again and laterality confirmed.  Incision was made over the anterior lateral aspect of the proximal thigh in line with the TFL.  Appropriate retractors were placed and all bleeding vessels were coagulated within the subcutaneous and fatty layers.  An incision was made in the TFL fascia in the interval was carefully identified.  The lateral ascending branches of the circumflex vessels were identified, cauterized and carefully dissected. The main vessels were carefully coagulated with water cooled bipolar electrocautery.  Retractors were placed around the superior lateral and inferior medial aspects of the femoral neck and a capsulotomy was performed exposing the hip joint.  Retraction stitches were placed and the capsulotomy to assist with visualization.  Femoral neck cut was then made and the femoral head was extracted after placing the leg in traction.  Bone wax was then applied to the proximal cut surface of the femur and aqua mantis was used to address any bleeding around the femoral neck cut.  Retractors were then placed around the acetabulum to fully visualize the joint space, and the remaining labral tissue was removed and pulvinar was removed.   The acetabulum was then sequentially reamed up to the appropriate size in order to get good fit and fill for the acetabular component while under fluoroscopic guidance.  Acetabular component was then placed and malleted  into a secure fit while confirming position and abduction angle and anteversion utilizing fluoroscopy.  2 screws were then placed in the acetabular cup to assist in securing the  cup in place. The cup was irrigated,  a real neutral liner was placed, impacted, and checked for stability. The femur traction was dropped and sequentially externally rotated while performing a release of the posterior and superomedial tissues off of the proximal femur to allow for mobility, care was taken to preserve the external rotators and piriformis attachments.  The remaining interval between the abductors and the capsule was dissected out and a retractor was placed over the superolateral aspect of the femur over the greater trochanter.  The leg was carefully brought down into extension and adducted to provide visualization of the proximal femur for broaching.  The femur was then sequentially broached up to an appropriate size which provided for good fill and stability to the femoral broach.  A trial neck and head were placed on the femoral broach and the leg was brought up for reduction.  The hip was reduced and manual check of stability was performed.  The hip was found to be stable in flexion internal rotation and extension external rotation.  Leg lengths were confirmed on fluoroscopy.   The hip was then dislocated the trial neck and head were removed.  The leg was then brought down into extension and adduction in the proximal femur was reexposed.  The broach trial was removed and the femur was irrigated with normal saline prior to the real femoral stem being implanted.  After the femoral stem was seated and shown to have good fit and fill the appropriate head was impacted the leg was brought up and reduced.  There was good range of motion with stability in flexion internal rotation and extension external rotation on testing.  Leg lengths were found to be appropriate on fluoroscopic evaluation at this time.  The hip was then irrigated with betdine based surgiphor solution and then saline solution.  The capsulotomy was repaired with Ethibond sutures.  A pericapsular and peritrochanteric cocktail with  Exparel and bupivacaine was then injected as well as the subcutaneous tissues. The fascia was closed with a #1 barbed running suture.  The deep tissues were closed with Vicryl sutures the subcutaneous tissues were closed with interrupted Vicryl sutures and a running barbed 4-0 suture.  The skin was then reinforced with Dermabond and a sterile dressing was placed.   The patient was awoken from anesthesia transferred off of the operating room table onto a hospital bed where examination of leg lengths found the leg lengths to be equal with a good distal pulse.  The patient was then transferred to the PACU in stable condition.

## 2023-03-31 NOTE — Anesthesia Procedure Notes (Addendum)
Spinal  Patient location during procedure: OR Start time: 03/31/2023 7:40 AM End time: 03/31/2023 7:50 AM Reason for block: surgical anesthesia Staffing Performed: anesthesiologist  Anesthesiologist: Lenard Simmer, MD Resident/CRNA: Lily Lovings, CRNA Performed by: Lily Lovings, CRNA Authorized by: Lenard Simmer, MD   Preanesthetic Checklist Completed: patient identified, IV checked, site marked, risks and benefits discussed, surgical consent, monitors and equipment checked, pre-op evaluation and timeout performed Spinal Block Patient position: sitting Prep: Betadine Patient monitoring: heart rate, continuous pulse ox, blood pressure and cardiac monitor Approach: midline Location: L2-3 Injection technique: single-shot Needle Needle type: Whitacre and Introducer  Needle gauge: 24 G Needle length: 9 cm Assessment Events: CSF return Additional Notes Negative paresthesia. Negative blood return. Positive free-flowing CSF. Expiration date of kit checked and confirmed. Patient tolerated procedure well, without complications.

## 2023-03-31 NOTE — Evaluation (Signed)
Physical Therapy Evaluation Patient Details Name: Dawn Werner MRN: [id number] DOB: 08-03-1950 Today's Date: 03/31/2023  History of Present Illness  Pt is a 72 yo female s/p L THA. PMH of MI, CAD, depression, anxiety, HTN HLD.  Clinical Impression  Patient A&Ox4 reported 8-10/10 L hip pain during session, premedicated and RN notified. Pt reported at baseline she is independent. She was able to perform several supine exercises with verbal cues. Supine to sit with bed rails and supervision. Good sitting  balance noted. Sit <> Stand with RW And CGA with verbal cues for technique. She ambulated ~100ft with RW and CGA, mild antalgic gait on LLE noted. Returned to room and in recliner at end of session with needs in reach.  Overall the patient demonstrated deficits (see "PT Problem List") that impede the patient's functional abilities, safety, and mobility and would benefit from skilled PT intervention.          If plan is discharge home, recommend the following: Assistance with cooking/housework;Assist for transportation;Help with stairs or ramp for entrance   Can travel by private vehicle        Equipment Recommendations Rolling walker (2 wheels)  Recommendations for Other Services       Functional Status Assessment Patient has had a recent decline in their functional status and demonstrates the ability to make significant improvements in function in a reasonable and predictable amount of time.     Precautions / Restrictions Precautions Precautions: Fall;Anterior Hip Precaution Booklet Issued: Yes (comment) Restrictions Weight Bearing Restrictions: Yes LLE Weight Bearing: Weight bearing as tolerated      Mobility  Bed Mobility Overal bed mobility: Needs Assistance Bed Mobility: Supine to Sit     Supine to sit: Supervision          Transfers Overall transfer level: Needs assistance Equipment used: Rolling walker (2 wheels) Transfers: Sit to/from Stand Sit to Stand:  Contact guard assist                Ambulation/Gait Ambulation/Gait assistance: Contact guard assist Gait Distance (Feet): 50 Feet Assistive device: Rolling walker (2 wheels)   Gait velocity: decreased     General Gait Details: mild antalgic gait on LLE  Stairs            Wheelchair Mobility     Tilt Bed    Modified Rankin (Stroke Patients Only)       Balance Overall balance assessment: Needs assistance Sitting-balance support: Feet supported Sitting balance-Leahy Scale: Good       Standing balance-Leahy Scale: Fair                               Pertinent Vitals/Pain Pain Assessment Pain Assessment: 0-10 Pain Score: 8  Pain Location: L hip Pain Descriptors / Indicators: Sore, Aching Pain Intervention(s): Limited activity within patient's tolerance, Monitored during session, Premedicated before session, Repositioned, Ice applied    Home Living Family/patient expects to be discharged to:: Private residence Living Arrangements: Spouse/significant other Available Help at Discharge: Family Type of Home: House Home Access: Stairs to enter;Ramped entrance       Home Layout: One level Home Equipment: None      Prior Function Prior Level of Function : Independent/Modified Independent                     Extremity/Trunk Assessment   Upper Extremity Assessment Upper Extremity Assessment: Generalized weakness    Lower Extremity  Assessment Lower Extremity Assessment: Generalized weakness       Communication      Cognition Arousal: Alert Behavior During Therapy: WFL for tasks assessed/performed Overall Cognitive Status: Within Functional Limits for tasks assessed                                          General Comments      Exercises Total Joint Exercises Ankle Circles/Pumps: AROM, Both, 10 reps Quad Sets: AROM, Both, 10 reps Hip ABduction/ADduction: AROM, Both, 10 reps   Assessment/Plan    PT  Assessment Patient needs continued PT services  PT Problem List Decreased strength;Pain;Decreased range of motion;Decreased activity tolerance;Decreased balance;Decreased mobility;Decreased knowledge of precautions;Decreased knowledge of use of DME       PT Treatment Interventions DME instruction;Neuromuscular re-education;Gait training;Stair training;Patient/family education;Functional mobility training;Therapeutic activities;Therapeutic exercise;Balance training    PT Goals (Current goals can be found in the Care Plan section)  Acute Rehab PT Goals Patient Stated Goal: to walk normally PT Goal Formulation: With patient Time For Goal Achievement: 04/14/23 Potential to Achieve Goals: Good    Frequency BID     Co-evaluation               AM-PAC PT "6 Clicks" Mobility  Outcome Measure Help needed turning from your back to your side while in a flat bed without using bedrails?: None Help needed moving from lying on your back to sitting on the side of a flat bed without using bedrails?: None Help needed moving to and from a bed to a chair (including a wheelchair)?: None Help needed standing up from a chair using your arms (e.g., wheelchair or bedside chair)?: None Help needed to walk in hospital room?: A Little Help needed climbing 3-5 steps with a railing? : A Little 6 Click Score: 22    End of Session Equipment Utilized During Treatment: Gait belt Activity Tolerance: Patient tolerated treatment well Patient left: in chair;with call bell/phone within reach Nurse Communication: Mobility status PT Visit Diagnosis: Other abnormalities of gait and mobility (R26.89);Difficulty in walking, not elsewhere classified (R26.2);Muscle weakness (generalized) (M62.81);Pain Pain - Right/Left: Left Pain - part of body: Hip    Time: 1610-9604 PT Time Calculation (min) (ACUTE ONLY): 19 min   Charges:   PT Evaluation $PT Eval Low Complexity: 1 Low PT Treatments $Therapeutic Activity:  8-22 mins PT General Charges $$ ACUTE PT VISIT: 1 Visit       Olga Coaster PT, DPT 3:57 PM,03/31/23

## 2023-03-31 NOTE — Anesthesia Procedure Notes (Signed)
Procedure Name: MAC Date/Time: 03/31/2023 7:30 AM  Performed by: Lily Lovings, CRNAPre-anesthesia Checklist: Patient identified, Emergency Drugs available, Suction available and Patient being monitored Patient Re-evaluated:Patient Re-evaluated prior to induction Oxygen Delivery Method: Simple face mask Preoxygenation: Pre-oxygenation with 100% oxygen Induction Type: IV induction

## 2023-03-31 NOTE — H&P (Signed)
History of Present Illness: Dawn Werner is an 72 y.o. female presents for history and physical for left direct anterior total hip arthroplasty with Dr. Audelia Acton on 03/31/2023. Patient has x-rays from December 06, 2022 showing advanced left hip osteoarthritis. She has near complete loss of joint space throughout the left hip with spurring, subchondral cyst formation and sclerotic changes noted. Patient reports she has had pain for more than a year which is initially intermittent and has become more constant over the lateral aspect of her groin radiating to her left buttock. There is no radiation down past the knee. She does report some secondary thoracic and back pain which is unrelated. She reports she is unable to participate in outdoor activities is much secondary to the pain and is limiting her function. She reports the pain is about a 6 or 7 out of 10 today but can become more significant with activity. She has been treated with oral medication and a hip injection on 12/06/2022. She reports she got about 1 month of relief from the hip injection at the pain returned and is continuing to worsen. She is interested in a more permanent solution for her pain at this time. She does report the pain is functionally limiting her at this point. The patient denies fevers, chills, numbness, tingling, shortness of breath, chest pain, recent illness, or any trauma.  Patient is a non-smoker she is a well-controlled diabetic and has a BMI of 30  Past Medical History: Past Medical History:  Diagnosis Date  Asthma without status asthmaticus (HHS-HCC)  CAD (coronary artery disease)  Diabetes mellitus (CMS/HHS-HCC)  Fibrocystic breast disease  GERD (gastroesophageal reflux disease)  Heart attack (CMS/HHS-HCC) 2009  History of chickenpox  Hyperlipidemia  Hypertension   Past Surgical History: Past Surgical History:  Procedure Laterality Date  HYSTERECTOMY 1986  COLONOSCOPY 06/18/2013  01/21/2003  (diverticulosis). Repeat 10 years (MUS).  Breast biopsy 1999  Breast reduction 1996  Surgery for ectopic pregnancy   Past Family History: Family History  Problem Relation Age of Onset  Hyperlipidemia (Elevated cholesterol) Mother  Breast cancer Sister   Medications: Current Outpatient Medications  Medication Sig Dispense Refill  ACCU-CHEK GUIDE ME GLUCOSE MTR Misc as directed  ACCU-CHEK GUIDE TEST STRIPS test strip USE TO TEST BLOOD SUGAR UP TO 4 TIMES A DAY AS DIRECTED  ACCU-CHEK SOFTCLIX LANCETS lancets USE TO TEST BLOOD SUGAR UP TO 4 TIMES A DAY AS DIRECTED  acyclovir (ZOVIRAX) 5 % ointment Apply topically Apply 1 application topically 2 (two) times daily as needed. Reported on 06/13/2015  albuterol (PROVENTIL HFA) 90 mcg/actuation inhaler Inhale 2 inhalations into the lungs every 4 (four) hours as needed for Wheezing.  aspirin 81 MG EC tablet Take 81 mg by mouth once daily.  atorvastatin (LIPITOR) 80 MG tablet Take 1 tablet by mouth once daily.  beclomethasone (QVAR) 80 mcg/actuation inhaler Inhale 1 inhalation into the lungs 2 (two) times daily.  carvediloL (COREG) 3.125 MG tablet Take 3.125 mg by mouth 2 (two) times daily  cephalexin (KEFLEX) 500 MG capsule TAKE 1 CAPSULE (500 MG TOTAL) BY MOUTH 4 (FOUR) TIMES DAILY FOR 5 DAYS.  diazePAM (VALIUM) 5 MG tablet TAKE 1-2 TABLET BY MOUTH ONCE FOR 1 DOSE  ezetimibe (ZETIA) 10 mg tablet TAKE 1 TABLET BY MOUTH EVERY DAY  fluticasone furoate-vilanterol (BREO ELLIPTA) 100-25 mcg/dose DsDv inhaler Inhale into the lungs Inhale 1 puff into the lungs daily.  LORazepam (ATIVAN) 0.5 MG tablet Take 0.5 mg by mouth nightly as needed for Anxiety.  losartan (COZAAR) 25 MG tablet Take 50 mg by mouth once daily  metFORMIN (GLUCOPHAGE-XR) 500 MG XR tablet Take 1 tablet by mouth once daily  omeprazole (PRILOSEC) 40 MG DR capsule Take by mouth Take 1 capsule (40 mg total) by mouth daily.  phenazopyridine (PYRIDIUM) 200 MG tablet TAKE 1 TABLET (200 MG  TOTAL) BY MOUTH 3 (THREE) TIMES DAILY.  pregabalin (LYRICA) 75 MG capsule Take 1-2 capsules (75-150 mg total) by mouth 3 (three) times daily.  rosuvastatin (CRESTOR) 40 MG tablet TAKE 1 TABLET (40 MG TOTAL) BY MOUTH DAILY.  simethicone 125 mg Tab Take by mouth Take 1 tablet (125 mg total) by mouth 4 (four) times daily as needed (For Belching or Flatulence).  sulfamethoxazole-trimethoprim (BACTRIM DS) 800-160 mg tablet TAKE 1 TABLET BY MOUTH TWICE A DAY FOR 5 DAYS  valACYclovir (VALTREX) 500 MG tablet Take 1 tablet by mouth once daily  zolpidem (AMBIEN) 10 mg tablet TAKE 1 TABLET BY MOUTH EVERYDAY AT BEDTIME   No current facility-administered medications for this visit.   Allergies: Allergies  Allergen Reactions  Metformin Diarrhea  And nausea  Metformin (Bulk) Diarrhea  And nausea    Visit Vitals: Vitals:  03/19/23 1023  BP: 122/80    Review of Systems:  A comprehensive 14 point ROS was performed, reviewed, and the pertinent orthopaedic findings are documented in the HPI.  Physical Exam: Body mass index is 30.43 kg/m.  General:  Well developed, well nourished, no apparent distress, normal affect, antalgic gait.  HEENT: Head normocephalic, atraumatic, PERRL.   Abdomen: Soft, non tender, non distended, Bowel sounds present.  Heart: Examination of the heart reveals regular, rate, and rhythm. There is no murmur noted on ascultation. There is a normal apical pulse.  Lungs: Lungs are clear to auscultation. There is no wheeze, rhonchi, or crackles. There is normal expansion of bilateral chest walls.   Left hip exam  SKIN: intact SWELLING: none WARMTH: no warmth TENDERNESS: none, Stinchfield Positive ROM: 0 degrees internal rotation and 30 degrees external rotation and pain with internal rotation and hip flexion localized to the lateral groin and lateral hip,; Hip Flexion 95 STRENGTH: normal GAIT: stiff-legged STABILITY: stable to testing CREPITUS: no LEG LENGTH  DISCREPANCY: none NEUROLOGICAL EXAM: normal VASCULAR EXAM: normal LUMBAR SPINE: tenderness: Mild left paraspinal tenderness straight leg raising sign: no motor exam: normal  The contralateral hip was examined for comparison and it showed: TENDERNESS: none ROM: normal and full STRENGTH: normal STABILITY: stable to testing  Left knee exam Skin intact with tenderness to palpation over the medial and lateral joint line Stable to range of motion testing 0 to 125 degrees Varus +1 at 0 degrees extension with good endpoint Minimal pain with range of motion testing Compartments all soft neurovascular intact down to the foot able to dorsiflex and plantarflex  Imaging :  I have reviewed AP pelvis and lateral hip X-rays 2 views of the left hip performed on 12/06/2022 images reviewed by myself. There is moderate to severe degenerative changes of the left hip with joint space narrowing with bone-on-bone articulation osteophyte formation femoral head deformity and sclerosis. There is subchondral cyst formation of the femoral head as well. No fractures or dislocations noted.  Assessment:  Encounter Diagnosis  Name Primary?  Primary osteoarthritis of left hip Yes  Left hip osteoarthritis, left knee osteoarthritis  Plan: Dawn is a 72 year old female here for history and physical for left direct anterior total hip arthroplasty with Dr. Audelia Acton on 03/31/2023. Patient has advanced left hip  osteoarthritis with complete loss of joint space. She has had severe pain interfering with quality of life and activities daily living. Risks, benefits, complications of a left total hip arthroplasty have discussed with the patient. Patient has agreed to consent procedure with Dr. Audelia Acton on 03/31/2023.  The hospitalization and post-operative care and rehabilitation were also discussed. The use of perioperative antibiotics and DVT prophylaxis were discussed. The risk, benefits and alternatives to a surgical  intervention were discussed at length with the patient. The patient was also advised of risks related to the medical comorbidities and elevated body mass index (BMI). A lengthy discussion took place to review the most common complications including but not limited to: deep vein thrombosis, pulmonary embolus, heart attack, stroke, infection, wound breakdown, heterotopic ossification, dislocation, numbness, leg length in-equality, intraoperative fracture, damage to nerves, tendon,muscles, arteries or other blood vessels, death and other possible complications from anesthesia. The patient was told that we will take steps to minimize these risks by using sterile technique, antibiotics and DVT prophylaxis when appropriate and follow the patient postoperatively in the office setting to monitor progress. The possibility of recurrent pain, no improvement in pain and actual worsening of pain were also discussed with the patient. The risk of dislocation following total hip replacement was discussed and potential precautions to prevent dislocation were reviewed.   All questions answered patient agrees with above plan to proceed with left anterior total hip arthroplasty.

## 2023-03-31 NOTE — Progress Notes (Signed)
Patient awake/alert x4.  Bladder scan done at 1008:   per orders I&O catheter:  patient tolerated:  emptied bladder for 

## 2023-03-31 NOTE — Progress Notes (Signed)
Patient is not able to walk the distance required to go the bathroom, or he/she is unable to safely negotiate stairs required to access the bathroom.  A 3in1 BSC will alleviate this problem  

## 2023-03-31 NOTE — Transfer of Care (Signed)
Immediate Anesthesia Transfer of Care Note  Patient: Dawn Werner  Procedure(s) Performed: TOTAL HIP ARTHROPLASTY ANTERIOR APPROACH (Left: Hip)  Patient Location: PACU  Anesthesia Type:MAC and Spinal  Level of Consciousness: drowsy and patient cooperative  Airway & Oxygen Therapy: Patient Spontanous Breathing and Patient connected to face mask oxygen  Post-op Assessment: Report given to RN and Patient moving all extremities  Post vital signs: Reviewed and stable  Last Vitals:  Vitals Value Taken Time  BP 97/66 03/31/23 1000  Temp    Pulse 75 03/31/23 1001  Resp 15 03/31/23 1000  SpO2 100 % 03/31/23 1001  Vitals shown include unfiled device data.  Last Pain:  Vitals:   03/31/23 0659  TempSrc: Temporal  PainSc: 0-No pain         Complications: No notable events documented.

## 2023-03-31 NOTE — Interval H&P Note (Signed)
Patient history and physical updated. Consent reviewed including risks, benefits, and alternatives to surgery. Patient agrees with above plan to proceed with left anterior total hip arthroplasty  

## 2023-04-01 DIAGNOSIS — M1612 Unilateral primary osteoarthritis, left hip: Secondary | ICD-10-CM | POA: Diagnosis not present

## 2023-04-01 DIAGNOSIS — Z79899 Other long term (current) drug therapy: Secondary | ICD-10-CM | POA: Diagnosis not present

## 2023-04-01 DIAGNOSIS — Z7982 Long term (current) use of aspirin: Secondary | ICD-10-CM | POA: Diagnosis not present

## 2023-04-01 DIAGNOSIS — E119 Type 2 diabetes mellitus without complications: Secondary | ICD-10-CM | POA: Diagnosis not present

## 2023-04-01 DIAGNOSIS — J45909 Unspecified asthma, uncomplicated: Secondary | ICD-10-CM | POA: Diagnosis not present

## 2023-04-01 DIAGNOSIS — Z7984 Long term (current) use of oral hypoglycemic drugs: Secondary | ICD-10-CM | POA: Diagnosis not present

## 2023-04-01 DIAGNOSIS — I10 Essential (primary) hypertension: Secondary | ICD-10-CM | POA: Diagnosis not present

## 2023-04-01 DIAGNOSIS — I2510 Atherosclerotic heart disease of native coronary artery without angina pectoris: Secondary | ICD-10-CM | POA: Diagnosis not present

## 2023-04-01 LAB — BASIC METABOLIC PANEL
Anion gap: 8 (ref 5–15)
BUN: 11 mg/dL (ref 8–23)
CO2: 25 mmol/L (ref 22–32)
Calcium: 8.5 mg/dL — ABNORMAL LOW (ref 8.9–10.3)
Chloride: 104 mmol/L (ref 98–111)
Creatinine, Ser: 0.55 mg/dL (ref 0.44–1.00)
GFR, Estimated: >60 mL/min (ref >60)
Glucose, Bld: 148 mg/dL — ABNORMAL HIGH (ref 70–99)
Potassium: 3.5 mmol/L (ref 3.5–5.1)
Sodium: 137 mmol/L (ref 135–145)

## 2023-04-01 LAB — CBC
HCT: 30.7 % — ABNORMAL LOW (ref 36.0–46.0)
Hemoglobin: 10.4 g/dL — ABNORMAL LOW (ref 12.0–15.0)
MCH: 29 pg (ref 26.0–34.0)
MCHC: 33.9 g/dL (ref 30.0–36.0)
MCV: 85.5 fL (ref 80.0–100.0)
Platelets: 188 10*3/uL (ref 150–400)
RBC: 3.59 MIL/uL — ABNORMAL LOW (ref 3.87–5.11)
RDW: 11.6 % (ref 11.5–15.5)
WBC: 9.5 10*3/uL (ref 4.0–10.5)
nRBC: 0 % (ref 0.0–0.2)

## 2023-04-01 LAB — GLUCOSE, CAPILLARY: Glucose-Capillary: 125 mg/dL — ABNORMAL HIGH (ref 70–99)

## 2023-04-01 MED ORDER — DOCUSATE SODIUM 100 MG PO CAPS: 100.0000 mg | ORAL_CAPSULE | Freq: Two times a day (BID) | ORAL | Status: AC

## 2023-04-01 MED ORDER — ONDANSETRON HCL 4 MG PO TABS: 4.0000 mg | ORAL_TABLET | Freq: Four times a day (QID) | ORAL | 0 refills | Status: DC | PRN

## 2023-04-01 MED ORDER — CARVEDILOL 3.125 MG PO TABS
ORAL_TABLET | ORAL | Status: AC
Start: 2023-04-01 — End: ?
  Filled 2023-04-01: qty 1

## 2023-04-01 MED ORDER — INSULIN ASPART 100 UNIT/ML IJ SOLN
INTRAMUSCULAR | Status: AC
  Filled 2023-04-01: qty 1

## 2023-04-01 MED ORDER — ENOXAPARIN SODIUM 40 MG/0.4ML IJ SOSY: 40.0000 mg | PREFILLED_SYRINGE | INTRAMUSCULAR | 0 refills | Status: DC

## 2023-04-01 MED ORDER — EZETIMIBE 10 MG PO TABS
ORAL_TABLET | ORAL | Status: AC
  Filled 2023-04-01: qty 1

## 2023-04-01 MED ORDER — TRAMADOL HCL 50 MG PO TABS
ORAL_TABLET | ORAL | Status: AC
  Filled 2023-04-01: qty 1

## 2023-04-01 MED ORDER — DOCUSATE SODIUM 100 MG PO CAPS
ORAL_CAPSULE | ORAL | Status: AC
  Filled 2023-04-01: qty 1

## 2023-04-01 MED ORDER — KETOROLAC TROMETHAMINE 15 MG/ML IJ SOLN
INTRAMUSCULAR | Status: AC
  Filled 2023-04-01: qty 1

## 2023-04-01 MED ORDER — ENOXAPARIN SODIUM 40 MG/0.4ML IJ SOSY
PREFILLED_SYRINGE | INTRAMUSCULAR | Status: AC
  Filled 2023-04-01: qty 0.4

## 2023-04-01 MED ORDER — CELECOXIB 200 MG PO CAPS: 200.0000 mg | ORAL_CAPSULE | Freq: Two times a day (BID) | ORAL | 0 refills | Status: AC

## 2023-04-01 MED ORDER — LOSARTAN POTASSIUM 50 MG PO TABS
ORAL_TABLET | ORAL | Status: AC
  Filled 2023-04-01: qty 1

## 2023-04-01 MED ORDER — TRAMADOL HCL 50 MG PO TABS: 50.0000 mg | ORAL_TABLET | Freq: Four times a day (QID) | ORAL | 0 refills | Status: DC | PRN

## 2023-04-01 MED ORDER — ACETAMINOPHEN 500 MG PO TABS
ORAL_TABLET | ORAL | Status: AC
  Filled 2023-04-01: qty 2

## 2023-04-01 MED ORDER — PANTOPRAZOLE SODIUM 40 MG PO TBEC
DELAYED_RELEASE_TABLET | ORAL | Status: AC
  Filled 2023-04-01: qty 1

## 2023-04-01 MED ORDER — ROSUVASTATIN CALCIUM 20 MG PO TABS
ORAL_TABLET | ORAL | Status: AC
  Filled 2023-04-01: qty 2

## 2023-04-01 NOTE — Progress Notes (Signed)
Discharge instructions given by Doreatha Martin, RN

## 2023-04-01 NOTE — Plan of Care (Signed)
  Problem: Activity: Goal: Ability to avoid complications of mobility impairment will improve Outcome: Progressing   Problem: Pain Management: Goal: Pain level will decrease with appropriate interventions Outcome: Progressing   

## 2023-04-01 NOTE — Discharge Summary (Signed)
Physician Discharge Summary  Patient ID: Dawn Werner MRN: [id number] DOB/AGE: 02/13/1951 72 y.o.  Admit date: 03/31/2023 Discharge date: 04/01/2023  Admission Diagnoses:  S/P total left hip arthroplasty [Q65.784]   Discharge Diagnoses: Patient Active Problem List   Diagnosis Date Noted   S/P total left hip arthroplasty 03/31/2023   Mild episode of recurrent major depressive disorder (HCC) 12/13/2021   Radiculitis, cervical 12/13/2021   Left lumbar radiculitis 12/13/2021   Marital problem 10/29/2021   Diabetes mellitus with coincident hypertension (HCC) 07/24/2020   Belching 08/18/2018   Chondromalacia of right knee 01/10/2016   Derangement of posterior horn of lateral meniscus of right knee 01/10/2016   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   Primary osteoarthritis of knee 01/24/2015   Asthma, mild intermittent 11/18/2014   Arteriosclerosis of coronary artery 11/18/2014   Insomnia, persistent 11/18/2014   Dyslipidemia 11/18/2014   Acid reflux 11/18/2014   Diabetes type 2, controlled (HCC) 11/18/2014   Genital herpes in women 11/18/2014   Hypertension, benign 11/18/2014   Asymptomatic postmenopausal status 11/18/2014   History of acute myocardial infarction 11/18/2014    Past Medical History:  Diagnosis Date   Anemia    Anxiety    Arthritis    Asthma    Coronary artery disease    Depression    Diabetes mellitus without complication (HCC)    GERD (gastroesophageal reflux disease)    Hyperlipidemia    Hypertension    Insomnia    Myocardial infarction [facility])      Transfusion: none   Consultants (if any):   Discharged Condition: Improved  Hospital Course: Dawn Werner is an 72 y.o. female who was admitted 03/31/2023 with a diagnosis of S/P total left hip arthroplasty and went to the operating room on 03/31/2023 and underwent the above named procedures.    Surgeries: Procedure(s): TOTAL HIP ARTHROPLASTY ANTERIOR APPROACH on 03/31/2023 Patient tolerated the surgery  well. Taken to PACU where she was stabilized and then transferred to the orthopedic floor.  Started on Lovenox 40 mg q 24 hrs. TEDs and SCDs applied bilaterally. Heels elevated on bed. No evidence of DVT. Negative Homan. Physical therapy started on day #1 for gait training and transfer. OT started day #1 for ADL and assisted devices.  Patient's IV was d/c on day #1. Patient was able to safely and independently complete all PT goals. PT recommending discharge to home.    On post op day #1 patient was stable and ready for discharge to home with HHPT.  Implants: Trident Tritanium Clusterhole 65mm/D w/ x2 screws    Liner: Neutral X3 poly 36/D  Stem: Insignia #5 std offset  Head:79mm -5mm biolox ceramic   She was given perioperative antibiotics:  Anti-infectives (From admission, onward)    Start     Dose/Rate Route Frequency Ordered Stop   03/31/23 1330  ceFAZolin (ANCEF) IVPB 2g/100 mL premix        2 g 200 mL/hr over 30 Minutes Intravenous Every 6 hours 03/31/23 1034 03/31/23 1929   03/31/23 0715  ceFAZolin (ANCEF) IVPB 2g/100 mL premix        2 g 200 mL/hr over 30 Minutes Intravenous On call to O.R. 03/31/23 6962 03/31/23 9528     .  She was given sequential compression devices, early ambulation, and Lovenox TEDs for DVT prophylaxis.  She benefited maximally from the hospital stay and there were no complications.    Recent vital signs:  Vitals:   04/01/23 0128 04/01/23 0600  BP: 138/74 (!) 140/77  Pulse:  75 82  Resp: 16 16  Temp: 97.8 F (36.6 C) 98.1 F (36.7 C)  SpO2: 96% 95%    Recent laboratory studies:  Lab Results  Component Value Date   HGB 10.4 (L) 04/01/2023   HGB 13.6 03/17/2023   HGB 13.7 09/27/2022   Lab Results  Component Value Date   WBC 9.5 04/01/2023   PLT 188 04/01/2023   No results found for: "INR" Lab Results  Component Value Date   NA 137 04/01/2023   K 3.5 04/01/2023   CL 104 04/01/2023   CO2 25 04/01/2023   BUN 11 04/01/2023    CREATININE 0.55 04/01/2023   GLUCOSE 148 (H) 04/01/2023    Discharge Medications:   Allergies as of 04/01/2023       Reactions   Metformin And Related Diarrhea   And nausea. Tolerates XR formulation        Medication List     STOP taking these medications    pregabalin 75 MG capsule Commonly known as: Lyrica   tiZANidine 2 MG tablet Commonly known as: ZANAFLEX       TAKE these medications    Accu-Chek Guide Me w/Device Kit USE AS DIRECTED   acetaminophen 500 MG tablet Commonly known as: TYLENOL Take 1,000 mg by mouth every 6 (six) hours as needed.   albuterol 108 (90 Base) MCG/ACT inhaler Commonly known as: VENTOLIN HFA Inhale 2-4 puffs into the lungs every 6 (six) hours as needed.   carvedilol 3.125 MG tablet Commonly known as: COREG TAKE 1 TABLET(3.125 MG) BY MOUTH TWICE DAILY   celecoxib 200 MG capsule Commonly known as: CeleBREX Take 1 capsule (200 mg total) by mouth 2 (two) times daily for 14 days.   citalopram 20 MG tablet Commonly known as: CELEXA Take 1 tablet (20 mg total) by mouth daily.   docusate sodium 100 MG capsule Commonly known as: COLACE Take 1 capsule (100 mg total) by mouth 2 (two) times daily.   enoxaparin 40 MG/0.4ML injection Commonly known as: LOVENOX Inject 0.4 mLs (40 mg total) into the skin daily for 14 days.   ezetimibe 10 MG tablet Commonly known as: ZETIA Take 1 tablet (10 mg total) by mouth daily.   losartan 25 MG tablet Commonly known as: COZAAR Take 1 tablet (25 mg total) by mouth daily.   metFORMIN 750 MG 24 hr tablet Commonly known as: GLUCOPHAGE-XR TAKE 1 TABLET BY MOUTH EVERY DAY WITH BREAKFAST   omeprazole 40 MG capsule Commonly known as: PRILOSEC Take 1 capsule (40 mg total) by mouth daily.   ondansetron 4 MG tablet Commonly known as: ZOFRAN Take 1 tablet (4 mg total) by mouth every 6 (six) hours as needed for nausea.   rosuvastatin 40 MG tablet Commonly known as: CRESTOR Take 1 tablet (40 mg  total) by mouth daily.   traMADol 50 MG tablet Commonly known as: ULTRAM Take 1 tablet (50 mg total) by mouth every 6 (six) hours as needed for moderate pain (pain score 4-6).   zolpidem 10 MG tablet Commonly known as: AMBIEN TAKE 1 TABLET(10 MG) BY MOUTH AT BEDTIME               Durable Medical Equipment  (From admission, onward)           Start     Ordered   03/31/23 1416  For home use only DME Bedside commode  Once       Question:  Patient needs a bedside commode to treat with the  following condition  Answer:  Impaired mobility   03/31/23 1416            Diagnostic Studies: DG HIP UNILAT WITH PELVIS 1V LEFT  Result Date: 03/31/2023 CLINICAL DATA:  Elective surgery EXAM: DG HIP (WITH OR WITHOUT PELVIS) 1V*L* COMPARISON:  None Available. FINDINGS: Three fluoroscopic spot views of the pelvis and left hip obtained in the operating room. Images during hip arthroplasty. Fluoroscopy time 34.5 seconds. Dose 3.29 mGy. IMPRESSION: Intraoperative fluoroscopy for left hip arthroplasty. Electronically Signed   By: Narda Rutherford M.D.   On: 03/31/2023 12:07   DG C-Arm 1-60 Min-No Report  Result Date: 03/31/2023 Fluoroscopy was utilized by the requesting physician.  No radiographic interpretation.   DG C-Arm 1-60 Min-No Report  Result Date: 03/31/2023 Fluoroscopy was utilized by the requesting physician.  No radiographic interpretation.    Disposition:      Follow-up Information     Evon Slack, PA-C Follow up in 2 week(s).   Specialties: Orthopedic Surgery, Emergency Medicine Contact information: [address] [place] [address] [phone]                  Signed: Patience Musca 04/01/2023, 7:43 AM

## 2023-04-01 NOTE — Progress Notes (Signed)
Physical Therapy Treatment Patient Details Name: Dawn Werner MRN: [id number] DOB: 15-May-1951 Today's Date: 04/01/2023   History of Present Illness Pt is a 72 yo female s/p L THA. PMH of MI, CAD, depression, anxiety, HTN HLD.    PT Comments  Pt alert, agreeable to PT reported 5/10 L hip pain. She was able to demonstrate bed mobility, transfers and ambulation with supervision and RW. Cued for hand placement and RW positioning with good follow through. She also performed stair navigation with CGA, able to teach back technique verbally. Pt instructed to minimize antalgic gait when able (lower pain levels). Returned to room with needs in reach. The patient would benefit from further skilled PT intervention to continue to progress towards goals.    If plan is discharge home, recommend the following: Assistance with cooking/housework;Assist for transportation;Help with stairs or ramp for entrance   Can travel by private vehicle        Equipment Recommendations  Rolling walker (2 wheels)    Recommendations for Other Services       Precautions / Restrictions Precautions Precautions: Fall;Anterior Hip Precaution Booklet Issued: Yes (comment) Restrictions Weight Bearing Restrictions: Yes LLE Weight Bearing: Weight bearing as tolerated     Mobility  Bed Mobility Overal bed mobility: Needs Assistance Bed Mobility: Supine to Sit     Supine to sit: Supervision          Transfers Overall transfer level: Needs assistance Equipment used: Rolling walker (2 wheels) Transfers: Sit to/from Stand Sit to Stand: Supervision           General transfer comment: cues for hand placement with good carryover    Ambulation/Gait Ambulation/Gait assistance: Contact guard assist, Supervision Gait Distance (Feet): 250 Feet Assistive device: Rolling walker (2 wheels)   Gait velocity: decreased     General Gait Details: mild antalgic gait on LLE   Stairs Stairs: Yes Stairs  assistance: Contact guard assist Stair Management: Two rails, Step to pattern Number of Stairs: 4     Wheelchair Mobility     Tilt Bed    Modified Rankin (Stroke Patients Only)       Balance Overall balance assessment: Needs assistance Sitting-balance support: Feet supported Sitting balance-Leahy Scale: Good       Standing balance-Leahy Scale: Fair                              Cognition Arousal: Alert Behavior During Therapy: WFL for tasks assessed/performed Overall Cognitive Status: Within Functional Limits for tasks assessed                                          Exercises      General Comments        Pertinent Vitals/Pain Pain Assessment Pain Assessment: 0-10 Pain Score: 5  Pain Location: L hip Pain Descriptors / Indicators: Sore, Aching Pain Intervention(s): Limited activity within patient's tolerance, Monitored during session, Repositioned, Premedicated before session    Home Living                          Prior Function            PT Goals (current goals can now be found in the care plan section) Progress towards PT goals: Progressing toward goals    Frequency  BID      PT Plan      Co-evaluation              AM-PAC PT "6 Clicks" Mobility   Outcome Measure  Help needed turning from your back to your side while in a flat bed without using bedrails?: None Help needed moving from lying on your back to sitting on the side of a flat bed without using bedrails?: None Help needed moving to and from a bed to a chair (including a wheelchair)?: None Help needed standing up from a chair using your arms (e.g., wheelchair or bedside chair)?: None Help needed to walk in hospital room?: A Little Help needed climbing 3-5 steps with a railing? : A Little 6 Click Score: 22    End of Session Equipment Utilized During Treatment: Gait belt Activity Tolerance: Patient tolerated treatment well Patient  left: in chair;with call bell/phone within reach Nurse Communication: Mobility status PT Visit Diagnosis: Other abnormalities of gait and mobility (R26.89);Difficulty in walking, not elsewhere classified (R26.2);Muscle weakness (generalized) (M62.81);Pain Pain - Right/Left: Left Pain - part of body: Hip     Time: 8295-6213 PT Time Calculation (min) (ACUTE ONLY): 20 min  Charges:    $Gait Training: 8-22 mins PT General Charges $$ ACUTE PT VISIT: 1 Visit                     Olga Coaster PT, DPT 11:44 AM,04/01/23

## 2023-04-01 NOTE — Anesthesia Postprocedure Evaluation (Signed)
Anesthesia Post Note  Patient: Dawn Werner  Procedure(s) Performed: TOTAL HIP ARTHROPLASTY ANTERIOR APPROACH (Left: Hip)  Patient location during evaluation: Nursing Unit Anesthesia Type: Spinal Level of consciousness: oriented and awake and alert Pain management: pain level controlled Vital Signs Assessment: post-procedure vital signs reviewed and stable Respiratory status: spontaneous breathing and respiratory function stable Cardiovascular status: blood pressure returned to baseline and stable Postop Assessment: no headache, no backache, no apparent nausea or vomiting and patient able to bend at knees Anesthetic complications: no   No notable events documented.   Last Vitals:  Vitals:   04/01/23 0128 04/01/23 0600  BP: 138/74 (!) 140/77  Pulse: 75 82  Resp: 16 16  Temp: 36.6 C 36.7 C  SpO2: 96% 95%    Last Pain:  Vitals:   04/01/23 0600  TempSrc: Temporal  PainSc:                  Karoline Caldwell

## 2023-04-01 NOTE — Care Management Obs Status (Signed)
MEDICARE OBSERVATION STATUS NOTIFICATION   Patient Details  Name: Dawn Werner MRN: [id number] Date of Birth: 1950/12/07   Medicare Observation Status Notification Given:  Yes    Marlowe Sax, RN 04/01/2023, 8:30 AM

## 2023-04-01 NOTE — Plan of Care (Signed)
  Problem: Fluid Volume: Goal: Ability to maintain a balanced intake and output will improve Outcome: Progressing   Problem: Activity: Goal: Ability to avoid complications of mobility impairment will improve Outcome: Progressing Goal: Ability to tolerate increased activity will improve Outcome: Progressing   Problem: Pain Management: Goal: Pain level will decrease with appropriate interventions Outcome: Progressing

## 2023-04-01 NOTE — TOC Progression Note (Signed)
Transition of Care [facility]) - Progression Note    Patient Details  Name: Dawn Werner MRN: [id number] Date of Birth: 1950-11-27  [facility] CM/SW Contact  Marlowe Sax, RN Phone Number: 04/01/2023, 8:32 AM  Clinical Narrative:     The patient reports to Bedside nurse that she has a rolling walker, a 3 in 1 will be delivered to the bedside by [employer] [facility] was set up for [facility] prior to surgery by surgeons office  Expected Discharge Plan: Home w Home Health Services Barriers to Discharge: No Barriers Identified  Expected Discharge Plan and Services   Discharge Planning Services: CM Consult   Living arrangements for the past 2 months: Single Family Home Expected Discharge Date: 04/01/23               DME Arranged: 3-N-1 DME Agency: [facility] Date DME Agency Contacted: 04/01/23 Time DME Agency Contacted: [phone] Representative spoke with at DME Agency: Osvaldo Angst Arranged: PT, OT [facility] Agency: [facility] Date HH Agency Contacted: 03/31/23 Time HH Agency Contacted: [phone] Representative spoke with at [facility] Agency: Barbara Cower   Social Determinants of Health (SDOH) Interventions SDOH Screenings   Food Insecurity: No Food Insecurity (03/31/2023)  Housing: Low Risk  (03/31/2023)  Transportation Needs: No Transportation Needs (03/31/2023)  Utilities: Not At Risk (03/31/2023)  Alcohol Screen: Low Risk  (11/28/2022)  Depression (PHQ2-9): Low Risk  (03/17/2023)  Financial Resource Strain: Low Risk  (11/28/2022)  Physical Activity: Sufficiently Active (11/28/2022)  Social Connections: Socially Integrated (11/28/2022)  Stress: No Stress Concern Present (11/28/2022)  Tobacco Use: Low Risk  (03/31/2023)    Readmission Risk Interventions     No data to display

## 2023-04-01 NOTE — Progress Notes (Signed)
   Subjective: 1 Day Post-Op Procedure(s) (LRB): TOTAL HIP ARTHROPLASTY ANTERIOR APPROACH (Left) Patient reports pain as mild.   Patient is well, and has had no acute complaints or problems Denies any CP, SOB, ABD pain. We will continue therapy today.  Plan is to go Home after hospital stay.  Objective: Vital signs in last 24 hours: Temp:  [97.2 F (36.2 C)-98.1 F (36.7 C)] 98.1 F (36.7 C) (10/22 0600) Pulse Rate:  [61-82] 82 (10/22 0600) Resp:  [10-18] 16 (10/22 0600) BP: (97-140)/(63-77) 140/77 (10/22 0600) SpO2:  [93 %-100 %] 95 % (10/22 0600)  Intake/Output from previous day: 10/21 0701 - 10/22 0700 In: 1937.3 [P.O.:705; I.V.:832.3; IV Piggyback:400] Out: 860 [Urine:610; Blood:250] Intake/Output this shift: No intake/output data recorded.  Recent Labs    04/01/23 0606  HGB 10.4*   Recent Labs    04/01/23 0606  WBC 9.5  RBC 3.59*  HCT 30.7*  PLT 188   Recent Labs    04/01/23 0606  NA 137  K 3.5  CL 104  CO2 25  BUN 11  CREATININE 0.55  GLUCOSE 148*  CALCIUM 8.5*   No results for input(s): "LABPT", "INR" in the last 72 hours.  EXAM General - Patient is Alert, Appropriate, and Oriented Extremity - Neurovascular intact Sensation intact distally Intact pulses distally Dorsiflexion/Plantar flexion intact No cellulitis present Compartment soft Dressing - dressing C/D/I and no drainage Motor Function - intact, moving foot and toes well on exam.   Past Medical History:  Diagnosis Date   Anemia    Anxiety    Arthritis    Asthma    Coronary artery disease    Depression    Diabetes mellitus without complication (HCC)    GERD (gastroesophageal reflux disease)    Hyperlipidemia    Hypertension    Insomnia    Myocardial infarction (HCC)     Assessment/Plan:   1 Day Post-Op Procedure(s) (LRB): TOTAL HIP ARTHROPLASTY ANTERIOR APPROACH (Left) Principal Problem:   S/P total left hip arthroplasty  Estimated body mass index is 29.72 kg/m as  calculated from the following:   Height as of this encounter: 5\' 3"  (1.6 m).   Weight as of this encounter: 76.1 kg. Advance diet Up with therapy Pain well controlled Labs and VSS. Hgb 10.4 CM to assist with discharge to home with HHPT today pending safe completion of PT goals  DVT Prophylaxis - Lovenox, TED hose, and SCDs Weight-Bearing as tolerated to left leg   T. Cranston Neighbor, PA-C [facility] 04/01/2023, 7:41 AM

## 2023-04-02 DIAGNOSIS — Z471 Aftercare following joint replacement surgery: Secondary | ICD-10-CM | POA: Diagnosis not present

## 2023-04-02 DIAGNOSIS — M5416 Radiculopathy, lumbar region: Secondary | ICD-10-CM | POA: Diagnosis not present

## 2023-04-02 DIAGNOSIS — Z791 Long term (current) use of non-steroidal anti-inflammatories (NSAID): Secondary | ICD-10-CM | POA: Diagnosis not present

## 2023-04-02 DIAGNOSIS — M170 Bilateral primary osteoarthritis of knee: Secondary | ICD-10-CM | POA: Diagnosis not present

## 2023-04-02 DIAGNOSIS — J4520 Mild intermittent asthma, uncomplicated: Secondary | ICD-10-CM | POA: Diagnosis not present

## 2023-04-02 DIAGNOSIS — R809 Proteinuria, unspecified: Secondary | ICD-10-CM | POA: Diagnosis not present

## 2023-04-02 DIAGNOSIS — Z79891 Long term (current) use of opiate analgesic: Secondary | ICD-10-CM | POA: Diagnosis not present

## 2023-04-02 DIAGNOSIS — Z7901 Long term (current) use of anticoagulants: Secondary | ICD-10-CM | POA: Diagnosis not present

## 2023-04-02 DIAGNOSIS — G4700 Insomnia, unspecified: Secondary | ICD-10-CM | POA: Diagnosis not present

## 2023-04-02 DIAGNOSIS — K219 Gastro-esophageal reflux disease without esophagitis: Secondary | ICD-10-CM | POA: Diagnosis not present

## 2023-04-02 DIAGNOSIS — B029 Zoster without complications: Secondary | ICD-10-CM | POA: Diagnosis not present

## 2023-04-02 DIAGNOSIS — E785 Hyperlipidemia, unspecified: Secondary | ICD-10-CM | POA: Diagnosis not present

## 2023-04-02 DIAGNOSIS — I2510 Atherosclerotic heart disease of native coronary artery without angina pectoris: Secondary | ICD-10-CM | POA: Diagnosis not present

## 2023-04-02 DIAGNOSIS — I700 Atherosclerosis of aorta: Secondary | ICD-10-CM | POA: Diagnosis not present

## 2023-04-02 DIAGNOSIS — E1159 Type 2 diabetes mellitus with other circulatory complications: Secondary | ICD-10-CM | POA: Diagnosis not present

## 2023-04-02 DIAGNOSIS — I152 Hypertension secondary to endocrine disorders: Secondary | ICD-10-CM | POA: Diagnosis not present

## 2023-04-02 DIAGNOSIS — M5412 Radiculopathy, cervical region: Secondary | ICD-10-CM | POA: Diagnosis not present

## 2023-04-02 DIAGNOSIS — M94261 Chondromalacia, right knee: Secondary | ICD-10-CM | POA: Diagnosis not present

## 2023-04-02 DIAGNOSIS — I252 Old myocardial infarction: Secondary | ICD-10-CM | POA: Diagnosis not present

## 2023-04-02 DIAGNOSIS — D649 Anemia, unspecified: Secondary | ICD-10-CM | POA: Diagnosis not present

## 2023-04-02 DIAGNOSIS — Z7984 Long term (current) use of oral hypoglycemic drugs: Secondary | ICD-10-CM | POA: Diagnosis not present

## 2023-04-02 DIAGNOSIS — E1129 Type 2 diabetes mellitus with other diabetic kidney complication: Secondary | ICD-10-CM | POA: Diagnosis not present

## 2023-04-02 DIAGNOSIS — M23351 Other meniscus derangements, posterior horn of lateral meniscus, right knee: Secondary | ICD-10-CM | POA: Diagnosis not present

## 2023-04-02 LAB — SURGICAL PATHOLOGY

## 2023-04-04 DIAGNOSIS — Z7901 Long term (current) use of anticoagulants: Secondary | ICD-10-CM | POA: Diagnosis not present

## 2023-04-04 DIAGNOSIS — I252 Old myocardial infarction: Secondary | ICD-10-CM | POA: Diagnosis not present

## 2023-04-04 DIAGNOSIS — E785 Hyperlipidemia, unspecified: Secondary | ICD-10-CM | POA: Diagnosis not present

## 2023-04-04 DIAGNOSIS — G4700 Insomnia, unspecified: Secondary | ICD-10-CM | POA: Diagnosis not present

## 2023-04-04 DIAGNOSIS — B029 Zoster without complications: Secondary | ICD-10-CM | POA: Diagnosis not present

## 2023-04-04 DIAGNOSIS — K219 Gastro-esophageal reflux disease without esophagitis: Secondary | ICD-10-CM | POA: Diagnosis not present

## 2023-04-04 DIAGNOSIS — M170 Bilateral primary osteoarthritis of knee: Secondary | ICD-10-CM | POA: Diagnosis not present

## 2023-04-04 DIAGNOSIS — M5416 Radiculopathy, lumbar region: Secondary | ICD-10-CM | POA: Diagnosis not present

## 2023-04-04 DIAGNOSIS — M94261 Chondromalacia, right knee: Secondary | ICD-10-CM | POA: Diagnosis not present

## 2023-04-04 DIAGNOSIS — Z79891 Long term (current) use of opiate analgesic: Secondary | ICD-10-CM | POA: Diagnosis not present

## 2023-04-04 DIAGNOSIS — Z471 Aftercare following joint replacement surgery: Secondary | ICD-10-CM | POA: Diagnosis not present

## 2023-04-04 DIAGNOSIS — I152 Hypertension secondary to endocrine disorders: Secondary | ICD-10-CM | POA: Diagnosis not present

## 2023-04-04 DIAGNOSIS — E1129 Type 2 diabetes mellitus with other diabetic kidney complication: Secondary | ICD-10-CM | POA: Diagnosis not present

## 2023-04-04 DIAGNOSIS — R809 Proteinuria, unspecified: Secondary | ICD-10-CM | POA: Diagnosis not present

## 2023-04-04 DIAGNOSIS — M23351 Other meniscus derangements, posterior horn of lateral meniscus, right knee: Secondary | ICD-10-CM | POA: Diagnosis not present

## 2023-04-04 DIAGNOSIS — J4520 Mild intermittent asthma, uncomplicated: Secondary | ICD-10-CM | POA: Diagnosis not present

## 2023-04-04 DIAGNOSIS — M5412 Radiculopathy, cervical region: Secondary | ICD-10-CM | POA: Diagnosis not present

## 2023-04-04 DIAGNOSIS — Z7984 Long term (current) use of oral hypoglycemic drugs: Secondary | ICD-10-CM | POA: Diagnosis not present

## 2023-04-04 DIAGNOSIS — D649 Anemia, unspecified: Secondary | ICD-10-CM | POA: Diagnosis not present

## 2023-04-04 DIAGNOSIS — I2510 Atherosclerotic heart disease of native coronary artery without angina pectoris: Secondary | ICD-10-CM | POA: Diagnosis not present

## 2023-04-04 DIAGNOSIS — E1159 Type 2 diabetes mellitus with other circulatory complications: Secondary | ICD-10-CM | POA: Diagnosis not present

## 2023-04-04 DIAGNOSIS — I700 Atherosclerosis of aorta: Secondary | ICD-10-CM | POA: Diagnosis not present

## 2023-04-04 DIAGNOSIS — Z791 Long term (current) use of non-steroidal anti-inflammatories (NSAID): Secondary | ICD-10-CM | POA: Diagnosis not present

## 2023-04-07 DIAGNOSIS — E1159 Type 2 diabetes mellitus with other circulatory complications: Secondary | ICD-10-CM | POA: Diagnosis not present

## 2023-04-07 DIAGNOSIS — Z79891 Long term (current) use of opiate analgesic: Secondary | ICD-10-CM | POA: Diagnosis not present

## 2023-04-07 DIAGNOSIS — Z791 Long term (current) use of non-steroidal anti-inflammatories (NSAID): Secondary | ICD-10-CM | POA: Diagnosis not present

## 2023-04-07 DIAGNOSIS — I152 Hypertension secondary to endocrine disorders: Secondary | ICD-10-CM | POA: Diagnosis not present

## 2023-04-07 DIAGNOSIS — M94261 Chondromalacia, right knee: Secondary | ICD-10-CM | POA: Diagnosis not present

## 2023-04-07 DIAGNOSIS — M170 Bilateral primary osteoarthritis of knee: Secondary | ICD-10-CM | POA: Diagnosis not present

## 2023-04-07 DIAGNOSIS — M5412 Radiculopathy, cervical region: Secondary | ICD-10-CM | POA: Diagnosis not present

## 2023-04-07 DIAGNOSIS — I2510 Atherosclerotic heart disease of native coronary artery without angina pectoris: Secondary | ICD-10-CM | POA: Diagnosis not present

## 2023-04-07 DIAGNOSIS — M23351 Other meniscus derangements, posterior horn of lateral meniscus, right knee: Secondary | ICD-10-CM | POA: Diagnosis not present

## 2023-04-07 DIAGNOSIS — R809 Proteinuria, unspecified: Secondary | ICD-10-CM | POA: Diagnosis not present

## 2023-04-07 DIAGNOSIS — Z471 Aftercare following joint replacement surgery: Secondary | ICD-10-CM | POA: Diagnosis not present

## 2023-04-07 DIAGNOSIS — B029 Zoster without complications: Secondary | ICD-10-CM | POA: Diagnosis not present

## 2023-04-07 DIAGNOSIS — E785 Hyperlipidemia, unspecified: Secondary | ICD-10-CM | POA: Diagnosis not present

## 2023-04-07 DIAGNOSIS — I252 Old myocardial infarction: Secondary | ICD-10-CM | POA: Diagnosis not present

## 2023-04-07 DIAGNOSIS — K219 Gastro-esophageal reflux disease without esophagitis: Secondary | ICD-10-CM | POA: Diagnosis not present

## 2023-04-07 DIAGNOSIS — I700 Atherosclerosis of aorta: Secondary | ICD-10-CM | POA: Diagnosis not present

## 2023-04-07 DIAGNOSIS — M5416 Radiculopathy, lumbar region: Secondary | ICD-10-CM | POA: Diagnosis not present

## 2023-04-07 DIAGNOSIS — G4700 Insomnia, unspecified: Secondary | ICD-10-CM | POA: Diagnosis not present

## 2023-04-07 DIAGNOSIS — Z7901 Long term (current) use of anticoagulants: Secondary | ICD-10-CM | POA: Diagnosis not present

## 2023-04-07 DIAGNOSIS — E1129 Type 2 diabetes mellitus with other diabetic kidney complication: Secondary | ICD-10-CM | POA: Diagnosis not present

## 2023-04-07 DIAGNOSIS — J4520 Mild intermittent asthma, uncomplicated: Secondary | ICD-10-CM | POA: Diagnosis not present

## 2023-04-07 DIAGNOSIS — D649 Anemia, unspecified: Secondary | ICD-10-CM | POA: Diagnosis not present

## 2023-04-07 DIAGNOSIS — Z7984 Long term (current) use of oral hypoglycemic drugs: Secondary | ICD-10-CM | POA: Diagnosis not present

## 2023-04-09 DIAGNOSIS — E1159 Type 2 diabetes mellitus with other circulatory complications: Secondary | ICD-10-CM | POA: Diagnosis not present

## 2023-04-09 DIAGNOSIS — I700 Atherosclerosis of aorta: Secondary | ICD-10-CM | POA: Diagnosis not present

## 2023-04-09 DIAGNOSIS — I152 Hypertension secondary to endocrine disorders: Secondary | ICD-10-CM | POA: Diagnosis not present

## 2023-04-09 DIAGNOSIS — E785 Hyperlipidemia, unspecified: Secondary | ICD-10-CM | POA: Diagnosis not present

## 2023-04-09 DIAGNOSIS — Z791 Long term (current) use of non-steroidal anti-inflammatories (NSAID): Secondary | ICD-10-CM | POA: Diagnosis not present

## 2023-04-09 DIAGNOSIS — I2510 Atherosclerotic heart disease of native coronary artery without angina pectoris: Secondary | ICD-10-CM | POA: Diagnosis not present

## 2023-04-09 DIAGNOSIS — R809 Proteinuria, unspecified: Secondary | ICD-10-CM | POA: Diagnosis not present

## 2023-04-09 DIAGNOSIS — M23351 Other meniscus derangements, posterior horn of lateral meniscus, right knee: Secondary | ICD-10-CM | POA: Diagnosis not present

## 2023-04-09 DIAGNOSIS — M5416 Radiculopathy, lumbar region: Secondary | ICD-10-CM | POA: Diagnosis not present

## 2023-04-09 DIAGNOSIS — B029 Zoster without complications: Secondary | ICD-10-CM | POA: Diagnosis not present

## 2023-04-09 DIAGNOSIS — E1129 Type 2 diabetes mellitus with other diabetic kidney complication: Secondary | ICD-10-CM | POA: Diagnosis not present

## 2023-04-09 DIAGNOSIS — Z79891 Long term (current) use of opiate analgesic: Secondary | ICD-10-CM | POA: Diagnosis not present

## 2023-04-09 DIAGNOSIS — Z471 Aftercare following joint replacement surgery: Secondary | ICD-10-CM | POA: Diagnosis not present

## 2023-04-09 DIAGNOSIS — G4700 Insomnia, unspecified: Secondary | ICD-10-CM | POA: Diagnosis not present

## 2023-04-09 DIAGNOSIS — Z7901 Long term (current) use of anticoagulants: Secondary | ICD-10-CM | POA: Diagnosis not present

## 2023-04-09 DIAGNOSIS — I252 Old myocardial infarction: Secondary | ICD-10-CM | POA: Diagnosis not present

## 2023-04-09 DIAGNOSIS — K219 Gastro-esophageal reflux disease without esophagitis: Secondary | ICD-10-CM | POA: Diagnosis not present

## 2023-04-09 DIAGNOSIS — M94261 Chondromalacia, right knee: Secondary | ICD-10-CM | POA: Diagnosis not present

## 2023-04-09 DIAGNOSIS — Z7984 Long term (current) use of oral hypoglycemic drugs: Secondary | ICD-10-CM | POA: Diagnosis not present

## 2023-04-09 DIAGNOSIS — M5412 Radiculopathy, cervical region: Secondary | ICD-10-CM | POA: Diagnosis not present

## 2023-04-09 DIAGNOSIS — M170 Bilateral primary osteoarthritis of knee: Secondary | ICD-10-CM | POA: Diagnosis not present

## 2023-04-09 DIAGNOSIS — D649 Anemia, unspecified: Secondary | ICD-10-CM | POA: Diagnosis not present

## 2023-04-09 DIAGNOSIS — J4520 Mild intermittent asthma, uncomplicated: Secondary | ICD-10-CM | POA: Diagnosis not present

## 2023-04-11 DIAGNOSIS — Z7901 Long term (current) use of anticoagulants: Secondary | ICD-10-CM | POA: Diagnosis not present

## 2023-04-11 DIAGNOSIS — J4520 Mild intermittent asthma, uncomplicated: Secondary | ICD-10-CM | POA: Diagnosis not present

## 2023-04-11 DIAGNOSIS — E1159 Type 2 diabetes mellitus with other circulatory complications: Secondary | ICD-10-CM | POA: Diagnosis not present

## 2023-04-11 DIAGNOSIS — B029 Zoster without complications: Secondary | ICD-10-CM | POA: Diagnosis not present

## 2023-04-11 DIAGNOSIS — I252 Old myocardial infarction: Secondary | ICD-10-CM | POA: Diagnosis not present

## 2023-04-11 DIAGNOSIS — M23351 Other meniscus derangements, posterior horn of lateral meniscus, right knee: Secondary | ICD-10-CM | POA: Diagnosis not present

## 2023-04-11 DIAGNOSIS — E1129 Type 2 diabetes mellitus with other diabetic kidney complication: Secondary | ICD-10-CM | POA: Diagnosis not present

## 2023-04-11 DIAGNOSIS — G4700 Insomnia, unspecified: Secondary | ICD-10-CM | POA: Diagnosis not present

## 2023-04-11 DIAGNOSIS — I2510 Atherosclerotic heart disease of native coronary artery without angina pectoris: Secondary | ICD-10-CM | POA: Diagnosis not present

## 2023-04-11 DIAGNOSIS — Z7984 Long term (current) use of oral hypoglycemic drugs: Secondary | ICD-10-CM | POA: Diagnosis not present

## 2023-04-11 DIAGNOSIS — Z471 Aftercare following joint replacement surgery: Secondary | ICD-10-CM | POA: Diagnosis not present

## 2023-04-11 DIAGNOSIS — M94261 Chondromalacia, right knee: Secondary | ICD-10-CM | POA: Diagnosis not present

## 2023-04-11 DIAGNOSIS — M5416 Radiculopathy, lumbar region: Secondary | ICD-10-CM | POA: Diagnosis not present

## 2023-04-11 DIAGNOSIS — I700 Atherosclerosis of aorta: Secondary | ICD-10-CM | POA: Diagnosis not present

## 2023-04-11 DIAGNOSIS — D649 Anemia, unspecified: Secondary | ICD-10-CM | POA: Diagnosis not present

## 2023-04-11 DIAGNOSIS — K219 Gastro-esophageal reflux disease without esophagitis: Secondary | ICD-10-CM | POA: Diagnosis not present

## 2023-04-11 DIAGNOSIS — M170 Bilateral primary osteoarthritis of knee: Secondary | ICD-10-CM | POA: Diagnosis not present

## 2023-04-11 DIAGNOSIS — I152 Hypertension secondary to endocrine disorders: Secondary | ICD-10-CM | POA: Diagnosis not present

## 2023-04-11 DIAGNOSIS — Z79891 Long term (current) use of opiate analgesic: Secondary | ICD-10-CM | POA: Diagnosis not present

## 2023-04-11 DIAGNOSIS — Z791 Long term (current) use of non-steroidal anti-inflammatories (NSAID): Secondary | ICD-10-CM | POA: Diagnosis not present

## 2023-04-11 DIAGNOSIS — M5412 Radiculopathy, cervical region: Secondary | ICD-10-CM | POA: Diagnosis not present

## 2023-04-11 DIAGNOSIS — E785 Hyperlipidemia, unspecified: Secondary | ICD-10-CM | POA: Diagnosis not present

## 2023-04-11 DIAGNOSIS — R809 Proteinuria, unspecified: Secondary | ICD-10-CM | POA: Diagnosis not present

## 2023-04-14 DIAGNOSIS — I252 Old myocardial infarction: Secondary | ICD-10-CM | POA: Diagnosis not present

## 2023-04-14 DIAGNOSIS — I2510 Atherosclerotic heart disease of native coronary artery without angina pectoris: Secondary | ICD-10-CM | POA: Diagnosis not present

## 2023-04-14 DIAGNOSIS — D649 Anemia, unspecified: Secondary | ICD-10-CM | POA: Diagnosis not present

## 2023-04-14 DIAGNOSIS — B029 Zoster without complications: Secondary | ICD-10-CM | POA: Diagnosis not present

## 2023-04-14 DIAGNOSIS — M23351 Other meniscus derangements, posterior horn of lateral meniscus, right knee: Secondary | ICD-10-CM | POA: Diagnosis not present

## 2023-04-14 DIAGNOSIS — M5416 Radiculopathy, lumbar region: Secondary | ICD-10-CM | POA: Diagnosis not present

## 2023-04-14 DIAGNOSIS — M5412 Radiculopathy, cervical region: Secondary | ICD-10-CM | POA: Diagnosis not present

## 2023-04-14 DIAGNOSIS — K219 Gastro-esophageal reflux disease without esophagitis: Secondary | ICD-10-CM | POA: Diagnosis not present

## 2023-04-14 DIAGNOSIS — Z7984 Long term (current) use of oral hypoglycemic drugs: Secondary | ICD-10-CM | POA: Diagnosis not present

## 2023-04-14 DIAGNOSIS — G4700 Insomnia, unspecified: Secondary | ICD-10-CM | POA: Diagnosis not present

## 2023-04-14 DIAGNOSIS — M94261 Chondromalacia, right knee: Secondary | ICD-10-CM | POA: Diagnosis not present

## 2023-04-14 DIAGNOSIS — R809 Proteinuria, unspecified: Secondary | ICD-10-CM | POA: Diagnosis not present

## 2023-04-14 DIAGNOSIS — E1129 Type 2 diabetes mellitus with other diabetic kidney complication: Secondary | ICD-10-CM | POA: Diagnosis not present

## 2023-04-14 DIAGNOSIS — I152 Hypertension secondary to endocrine disorders: Secondary | ICD-10-CM | POA: Diagnosis not present

## 2023-04-14 DIAGNOSIS — Z7901 Long term (current) use of anticoagulants: Secondary | ICD-10-CM | POA: Diagnosis not present

## 2023-04-14 DIAGNOSIS — Z791 Long term (current) use of non-steroidal anti-inflammatories (NSAID): Secondary | ICD-10-CM | POA: Diagnosis not present

## 2023-04-14 DIAGNOSIS — J4520 Mild intermittent asthma, uncomplicated: Secondary | ICD-10-CM | POA: Diagnosis not present

## 2023-04-14 DIAGNOSIS — E1159 Type 2 diabetes mellitus with other circulatory complications: Secondary | ICD-10-CM | POA: Diagnosis not present

## 2023-04-14 DIAGNOSIS — I700 Atherosclerosis of aorta: Secondary | ICD-10-CM | POA: Diagnosis not present

## 2023-04-14 DIAGNOSIS — Z471 Aftercare following joint replacement surgery: Secondary | ICD-10-CM | POA: Diagnosis not present

## 2023-04-14 DIAGNOSIS — M170 Bilateral primary osteoarthritis of knee: Secondary | ICD-10-CM | POA: Diagnosis not present

## 2023-04-14 DIAGNOSIS — Z79891 Long term (current) use of opiate analgesic: Secondary | ICD-10-CM | POA: Diagnosis not present

## 2023-04-14 DIAGNOSIS — E785 Hyperlipidemia, unspecified: Secondary | ICD-10-CM | POA: Diagnosis not present

## 2023-04-16 DIAGNOSIS — G4700 Insomnia, unspecified: Secondary | ICD-10-CM | POA: Diagnosis not present

## 2023-04-16 DIAGNOSIS — B029 Zoster without complications: Secondary | ICD-10-CM | POA: Diagnosis not present

## 2023-04-16 DIAGNOSIS — I152 Hypertension secondary to endocrine disorders: Secondary | ICD-10-CM | POA: Diagnosis not present

## 2023-04-16 DIAGNOSIS — E1129 Type 2 diabetes mellitus with other diabetic kidney complication: Secondary | ICD-10-CM | POA: Diagnosis not present

## 2023-04-16 DIAGNOSIS — Z471 Aftercare following joint replacement surgery: Secondary | ICD-10-CM | POA: Diagnosis not present

## 2023-04-16 DIAGNOSIS — E1159 Type 2 diabetes mellitus with other circulatory complications: Secondary | ICD-10-CM | POA: Diagnosis not present

## 2023-04-16 DIAGNOSIS — I252 Old myocardial infarction: Secondary | ICD-10-CM | POA: Diagnosis not present

## 2023-04-16 DIAGNOSIS — J4520 Mild intermittent asthma, uncomplicated: Secondary | ICD-10-CM | POA: Diagnosis not present

## 2023-04-16 DIAGNOSIS — M5412 Radiculopathy, cervical region: Secondary | ICD-10-CM | POA: Diagnosis not present

## 2023-04-16 DIAGNOSIS — I700 Atherosclerosis of aorta: Secondary | ICD-10-CM | POA: Diagnosis not present

## 2023-04-16 DIAGNOSIS — Z7984 Long term (current) use of oral hypoglycemic drugs: Secondary | ICD-10-CM | POA: Diagnosis not present

## 2023-04-16 DIAGNOSIS — E785 Hyperlipidemia, unspecified: Secondary | ICD-10-CM | POA: Diagnosis not present

## 2023-04-16 DIAGNOSIS — M94261 Chondromalacia, right knee: Secondary | ICD-10-CM | POA: Diagnosis not present

## 2023-04-16 DIAGNOSIS — D649 Anemia, unspecified: Secondary | ICD-10-CM | POA: Diagnosis not present

## 2023-04-16 DIAGNOSIS — K219 Gastro-esophageal reflux disease without esophagitis: Secondary | ICD-10-CM | POA: Diagnosis not present

## 2023-04-16 DIAGNOSIS — R809 Proteinuria, unspecified: Secondary | ICD-10-CM | POA: Diagnosis not present

## 2023-04-16 DIAGNOSIS — M170 Bilateral primary osteoarthritis of knee: Secondary | ICD-10-CM | POA: Diagnosis not present

## 2023-04-16 DIAGNOSIS — M23351 Other meniscus derangements, posterior horn of lateral meniscus, right knee: Secondary | ICD-10-CM | POA: Diagnosis not present

## 2023-04-16 DIAGNOSIS — M5416 Radiculopathy, lumbar region: Secondary | ICD-10-CM | POA: Diagnosis not present

## 2023-04-16 DIAGNOSIS — Z791 Long term (current) use of non-steroidal anti-inflammatories (NSAID): Secondary | ICD-10-CM | POA: Diagnosis not present

## 2023-04-16 DIAGNOSIS — Z7901 Long term (current) use of anticoagulants: Secondary | ICD-10-CM | POA: Diagnosis not present

## 2023-04-16 DIAGNOSIS — I2510 Atherosclerotic heart disease of native coronary artery without angina pectoris: Secondary | ICD-10-CM | POA: Diagnosis not present

## 2023-04-16 DIAGNOSIS — Z79891 Long term (current) use of opiate analgesic: Secondary | ICD-10-CM | POA: Diagnosis not present

## 2023-04-30 ENCOUNTER — Other Ambulatory Visit: Payer: Self-pay | Admitting: Family Medicine

## 2023-04-30 DIAGNOSIS — Z1231 Encounter for screening mammogram for malignant neoplasm of breast: Secondary | ICD-10-CM

## 2023-05-12 ENCOUNTER — Other Ambulatory Visit: Payer: Self-pay | Admitting: Family Medicine

## 2023-05-12 DIAGNOSIS — I10 Essential (primary) hypertension: Secondary | ICD-10-CM

## 2023-05-12 DIAGNOSIS — I252 Old myocardial infarction: Secondary | ICD-10-CM

## 2023-05-13 DIAGNOSIS — Z96642 Presence of left artificial hip joint: Secondary | ICD-10-CM | POA: Diagnosis not present

## 2023-05-19 NOTE — Progress Notes (Signed)
Name: Dawn Werner   MRN: [id number]    DOB: 03-27-1951   Date:06/02/2023       Progress Note  Subjective  Chief Complaint  Chief Complaint  Patient presents with   Medical Management of Chronic Issues    HPI  MDD  in remission : she still has some marital problems , able to ignore him and sometimes confronts him, she also has close friends.  Since she started on Celexa she seems to be doing better. She needs a refill   DMII:  last A1C spiked to 7.7 % before hip replacement - left side.   She has history of nephropathy and is on ARB .She denies polyphagia, polydipsia or polyuria. She is taking Metformin ER to 750 mg ER and denies side effects of medications ,we will adjust dose to 1500 mg today    Hyperlipidemia: she is back on Crestor 40 mg and Zetia 10 mg daily Denies myalgias Last LDL at goal at 61 - well controlled    HTN:  No chest pain, palpitation or SOB , taking medication as prescribed. BP is slightly elevated but she was stressed this am.    CAD: no episodes, s/p history of MI in 2009, taking aspirin daily, ARB,  beta-blocker and statin therapy.  Compliant with medications and denies chest pain or decrease in exercise tolerance Not currently seeing cardiologist    Asthma Mild Intermittent: she has been doing well, no cough, wheezing or SOB lately. She is back on Albuterol prn    Atherosclerosis of aorta: on statin, aspirin  81 mg daily  last LDL at goal. We will recheck it next visit    GERD: she is taking Omeprazole , usually at night, after dinner  She states between 5 until bed time she has a feeling that she has to belch. No regurgitation. Advised to switch Omeprazole to before dinner    Insomnia: she had weaned self off but in March she resumed taking it, got worse after hip surgery and is taking it every night again    Patient Active Problem List   Diagnosis Date Noted   S/P total left hip arthroplasty 03/31/2023   Mild episode of recurrent major depressive  disorder (HCC) 12/13/2021   Radiculitis, cervical 12/13/2021   Left lumbar radiculitis 12/13/2021   Marital problem 10/29/2021   Diabetes mellitus with coincident hypertension (HCC) 07/24/2020   Belching 08/18/2018   Chondromalacia of right knee 01/10/2016   Derangement of posterior horn of lateral meniscus of right knee 01/10/2016   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   Primary osteoarthritis of knee 01/24/2015   Asthma, mild intermittent 11/18/2014   Arteriosclerosis of coronary artery 11/18/2014   Insomnia, persistent 11/18/2014   Dyslipidemia 11/18/2014   Acid reflux 11/18/2014   Diabetes type 2, controlled (HCC) 11/18/2014   Genital herpes in women 11/18/2014   Hypertension, benign 11/18/2014   Asymptomatic postmenopausal status 11/18/2014   History of acute myocardial infarction 11/18/2014    Past Surgical History:  Procedure Laterality Date   ABDOMINAL HYSTERECTOMY     BREAST SURGERY     breast reduction   REDUCTION MAMMAPLASTY Bilateral 1995   TOTAL HIP ARTHROPLASTY Left 03/31/2023   Procedure: TOTAL HIP ARTHROPLASTY ANTERIOR APPROACH;  Surgeon: Reinaldo Berber, MD;  Location: [facility] ORS;  Service: Orthopedics;  Laterality: Left;   TUBAL LIGATION      Family History  Problem Relation Age of Onset   Hyperlipidemia Mother    Hypertension Mother    Stroke Mother  Dementia Father    Cancer Sister        breast   Breast cancer Sister 51   Asthma Brother    Depression Brother        bipolar   Heart disease Brother    Heart disease Brother     Social History   Tobacco Use   Smoking status: Never   Smokeless tobacco: Never  Substance Use Topics   Alcohol use: No    Alcohol/week: 0.0 standard drinks of alcohol     Current Outpatient Medications:    acetaminophen (TYLENOL) 500 MG tablet, Take 1,000 mg by mouth every 6 (six) hours as needed., Disp: , Rfl:    albuterol (VENTOLIN HFA) 108 (90 Base) MCG/ACT inhaler, Inhale 2-4 puffs into the lungs every 6 (six) hours as  needed., Disp: 8 g, Rfl: 0   Blood Glucose Monitoring Suppl (ACCU-CHEK GUIDE ME) w/Device KIT, USE AS DIRECTED, Disp: 1 kit, Rfl: 0   docusate sodium (COLACE) 100 MG capsule, Take 1 capsule (100 mg total) by mouth 2 (two) times daily., Disp: , Rfl:    losartan (COZAAR) 25 MG tablet, Take 1 tablet (25 mg total) by mouth daily., Disp: 90 tablet, Rfl: 3   omeprazole (PRILOSEC) 40 MG capsule, Take 1 capsule (40 mg total) by mouth daily., Disp: 90 capsule, Rfl: 3   rosuvastatin (CRESTOR) 40 MG tablet, Take 1 tablet (40 mg total) by mouth daily., Disp: 90 tablet, Rfl: 3   zolpidem (AMBIEN) 10 MG tablet, TAKE 1 TABLET(10 MG) BY MOUTH AT BEDTIME, Disp: 90 tablet, Rfl: 0   carvedilol (COREG) 3.125 MG tablet, Take 1 tablet (3.125 mg total) by mouth 2 (two) times daily with a meal., Disp: 180 tablet, Rfl: 0   citalopram (CELEXA) 20 MG tablet, Take 1 tablet (20 mg total) by mouth daily., Disp: 90 tablet, Rfl: 1   ezetimibe (ZETIA) 10 MG tablet, Take 1 tablet (10 mg total) by mouth daily., Disp: 90 tablet, Rfl: 1   metFORMIN (GLUCOPHAGE-XR) 750 MG 24 hr tablet, Take 2 tablets (1,500 mg total) by mouth daily with breakfast. TAKE 1 TABLET BY MOUTH EVERY DAY WITH BREAKFAST, Disp: 180 tablet, Rfl: 1  Allergies  Allergen Reactions   Metformin And Related Diarrhea    And nausea. Tolerates XR formulation    I personally reviewed active problem list, medication list, allergies, family history with the patient/caregiver today.   ROS  Ten systems reviewed and is negative except as mentioned in HPI    Objective  Vitals:   06/02/23 0922  BP: 138/76  Pulse: 92  Resp: 16  SpO2: 98%  Weight: 164 lb 14.4 oz (74.8 kg)  Height: 5\' 3"  (1.6 m)    Body mass index is 29.21 kg/m.  Physical Exam  Constitutional: Patient appears well-developed and well-nourished. Obese  No distress.  HEENT: head atraumatic, normocephalic, pupils equal and reactive to light, neck supple Cardiovascular: Normal rate, regular  rhythm and normal heart sounds.  No murmur heard. No BLE edema. Pulmonary/Chest: Effort normal and breath sounds normal. No respiratory distress. Abdominal: Soft.  There is no tenderness. Psychiatric: Patient has a normal mood and affect. behavior is normal. Judgment and thought content normal.   Recent Results (from the past 2160 hours)  COMPLETE METABOLIC PANEL WITH GFR     Status: Abnormal   Collection Time: 03/17/23 10:56 AM  Result Value Ref Range   Glucose, Bld 128 (H) 65 - 99 mg/dL    Comment: .  Fasting reference interval . For someone without known diabetes, a glucose value >125 mg/dL indicates that they may have diabetes and this should be confirmed with a follow-up test. .    BUN 8 7 - 25 mg/dL   Creat 1.61 0.96 - 0.45 mg/dL   eGFR 94 > OR = 60 WU/JWJ/1.91Y7   BUN/Creatinine Ratio SEE NOTE: 6 - 22 (calc)    Comment:    Not Reported: BUN and Creatinine are within    reference range. .    Sodium 141 135 - 146 mmol/L   Potassium 4.1 3.5 - 5.3 mmol/L   Chloride 105 98 - 110 mmol/L   CO2 28 20 - 32 mmol/L   Calcium 9.7 8.6 - 10.4 mg/dL   Total Protein 7.1 6.1 - 8.1 g/dL   Albumin 4.5 3.6 - 5.1 g/dL   Globulin 2.6 1.9 - 3.7 g/dL (calc)   AG Ratio 1.7 1.0 - 2.5 (calc)   Total Bilirubin 0.4 0.2 - 1.2 mg/dL   Alkaline phosphatase (APISO) 71 37 - 153 U/L   AST 32 10 - 35 U/L   ALT 39 (H) 6 - 29 U/L  CBC w/Diff/Platelet     Status: Abnormal   Collection Time: 03/17/23 10:56 AM  Result Value Ref Range   WBC 5.7 3.8 - 10.8 Thousand/uL   RBC 4.82 3.80 - 5.10 Million/uL   Hemoglobin 13.6 11.7 - 15.5 g/dL   HCT 82.9 56.2 - 13.0 %   MCV 89.0 80.0 - 100.0 fL   MCH 28.2 27.0 - 33.0 pg   MCHC 31.7 (L) 32.0 - 36.0 g/dL    Comment: For adults, a slight decrease in the calculated MCHC value (in the range of 30 to 32 g/dL) is most likely not clinically significant; however, it should be interpreted with caution in correlation with other red cell parameters and the  patient's clinical condition.    RDW 11.4 11.0 - 15.0 %   Platelets 269 140 - 400 Thousand/uL   MPV 10.5 7.5 - 12.5 fL   Neutro Abs 3,221 1,500 - 7,800 cells/uL   Lymphs Abs 1,910 850 - 3,900 cells/uL   Absolute Monocytes 428 200 - 950 cells/uL   Eosinophils Absolute 91 15 - 500 cells/uL   Basophils Absolute 51 0 - 200 cells/uL   Neutrophils Relative % 56.5 %   Total Lymphocyte 33.5 %   Monocytes Relative 7.5 %   Eosinophils Relative 1.6 %   Basophils Relative 0.9 %  HgB A1c     Status: Abnormal   Collection Time: 03/17/23 10:56 AM  Result Value Ref Range   Hgb A1c MFr Bld 7.7 (H) <5.7 % of total Hgb    Comment: For someone without known diabetes, a hemoglobin A1c value of 6.5% or greater indicates that they may have  diabetes and this should be confirmed with a follow-up  test. . For someone with known diabetes, a value <7% indicates  that their diabetes is well controlled and a value  greater than or equal to 7% indicates suboptimal  control. A1c targets should be individualized based on  duration of diabetes, age, comorbid conditions, and  other considerations. . Currently, no consensus exists regarding use of hemoglobin A1c for diagnosis of diabetes for children. .    Mean Plasma Glucose 174 mg/dL   eAG (mmol/L) 9.7 mmol/L  Type and screen Order type and screen if day of surgery is less than 15 days from draw of preadmission visit or order morning of surgery  if day of surgery is greater than 6 days from preadmission visit.     Status: None   Collection Time: 03/20/23  9:25 AM  Result Value Ref Range   ABO/RH(D) A POS    Antibody Screen NEG    Sample Expiration 04/03/2023,2359    Extend sample reason      NO TRANSFUSIONS OR PREGNANCY IN THE PAST 3 MONTHS Performed at [facility], [address], [place], [place] [address]   Surgical pcr screen     Status: None   Collection Time: 03/20/23  9:30 AM   Specimen: Nasal Mucosa; Nasal Swab  Result Value Ref  Range   MRSA, PCR NEGATIVE NEGATIVE   Staphylococcus aureus NEGATIVE NEGATIVE    Comment: (NOTE) The Xpert SA Assay (FDA approved for NASAL specimens in patients 56 years of age and older), is one component of a comprehensive surveillance program. It is not intended to diagnose infection nor to guide or monitor treatment. Performed at [facility], [address] [place], [place] [address]   Urinalysis, Routine w reflex microscopic -Urine, Clean Catch     Status: Abnormal   Collection Time: 03/20/23  9:30 AM  Result Value Ref Range   Color, Urine YELLOW (A) YELLOW   APPearance CLOUDY (A) CLEAR   Specific Gravity, Urine 1.023 1.005 - 1.030   pH 5.0 5.0 - 8.0   Glucose, UA NEGATIVE NEGATIVE mg/dL   Hgb urine dipstick SMALL (A) NEGATIVE   Bilirubin Urine NEGATIVE NEGATIVE   Ketones, ur NEGATIVE NEGATIVE mg/dL   Protein, ur NEGATIVE NEGATIVE mg/dL   Nitrite NEGATIVE NEGATIVE   Leukocytes,Ua LARGE (A) NEGATIVE   RBC / HPF 0-5 0 - 5 RBC/hpf   WBC, UA 11-20 0 - 5 WBC/hpf   Bacteria, UA NONE SEEN NONE SEEN   Squamous Epithelial / HPF 21-50 0 - 5 /HPF   Mucus PRESENT     Comment: Performed at [facility], [address] [place], [place] [address]  Surgical pathology     Status: None   Collection Time: 03/31/23 12:00 AM  Result Value Ref Range   SURGICAL PATHOLOGY      SURGICAL PATHOLOGY [facility] [address] [place], [place] [address] Telephone [phone] or [phone] Fax [phone]  REPORT OF SURGICAL PATHOLOGY   Accession #: [id number] Patient Name: LENORIA, SLATTERY Visit # : [id number]  MRN: [id number] Physician: Reinaldo Berber DOB/Age [birth year] (Age: 72) Gender: F Collected Date: 03/31/2023 Received Date: 03/31/2023  FINAL DIAGNOSIS       1. Femoral head, left :       CHANGES CONSISTENT WITH DEGENERATIVE JOINT DISEASE      NO TUMOR SEEN       DATE SIGNED OUT: 04/02/2023 ELECTRONIC  SIGNATURE : Picklesimer Md, Fred , Sports administrator, Electronic Signature  MICROSCOPIC DESCRIPTION  CASE COMMENTS STAINS USED IN DIAGNOSIS: H&E    CLINICAL HISTORY  SPECIMEN(S) OBTAINED 1. Femoral head, Left  SPECIMEN COMMENTS: SPECIMEN CLINICAL INFORMATION: 1. Primary Osteoarthritis of left hip    Gross Description 1. Specimen: Left femoral head      Size: 4.6 x 4.5 x 4.5  cm, including a 0.5 cm  long portion of femoral neck      Margin: Inked blue; moderately soft, red-pink bone marrow; 0.3 cm- rim of      white-tan, firm cortical bone      Articular surface: Tan-pink and focally bosselated with a mild amount of  attached tan-pink, soft tissue.      Cut surfaces: There is an ill-defined, firm area of white-pink discoloration      extending 0.3 cm deep from the surface that is 2.2 cm to the margin. The      remaining cut surfaces are tan-yellow, firm, and trabeculated without softened      areas. The cortical bone is 0.2 cm thick.      Block summary:      1A: Discoloration and margin (representative), bisected      AMG 03/31/2023        Report signed out from the following location(s) [place] [address], [place] [address] CLIA #: [id number]  [facility] [address] [place], [place] [address] CLIA #: [id number]   ABO/Rh     Status: None   Collection Time: 03/31/23  6:49 AM  Result Value Ref Range   ABO/RH(D)      A POS Performed at [facility], [address] [place], [place] [address]   Glucose, capillary     Status: Abnormal   Collection Time: 03/31/23  6:58 AM  Result Value Ref Range   Glucose-Capillary 167 (H) 70 - 99 mg/dL    Comment: Glucose reference range applies only to samples taken after fasting for at least 8 hours.  Glucose, capillary     Status: Abnormal   Collection Time: 03/31/23  9:59 AM  Result Value Ref Range   Glucose-Capillary 176 (H) 70 - 99 mg/dL    Comment: Glucose  reference range applies only to samples taken after fasting for at least 8 hours.  Glucose, capillary     Status: Abnormal   Collection Time: 03/31/23 12:31 PM  Result Value Ref Range   Glucose-Capillary 246 (H) 70 - 99 mg/dL    Comment: Glucose reference range applies only to samples taken after fasting for at least 8 hours.  Glucose, capillary     Status: Abnormal   Collection Time: 03/31/23  4:56 PM  Result Value Ref Range   Glucose-Capillary 188 (H) 70 - 99 mg/dL    Comment: Glucose reference range applies only to samples taken after fasting for at least 8 hours.  Glucose, capillary     Status: Abnormal   Collection Time: 03/31/23  9:47 PM  Result Value Ref Range   Glucose-Capillary 175 (H) 70 - 99 mg/dL    Comment: Glucose reference range applies only to samples taken after fasting for at least 8 hours.   Comment 1 Notify RN    Comment 2 Document in Chart   CBC     Status: Abnormal   Collection Time: 04/01/23  6:06 AM  Result Value Ref Range   WBC 9.5 4.0 - 10.5 K/uL   RBC 3.59 (L) 3.87 - 5.11 MIL/uL   Hemoglobin 10.4 (L) 12.0 - 15.0 g/dL   HCT 32.4 (L) 40.1 - 02.7 %   MCV 85.5 80.0 - 100.0 fL   MCH 29.0 26.0 - 34.0 pg   MCHC 33.9 30.0 - 36.0 g/dL   RDW 25.3 66.4 - 40.3 %   Platelets 188 150 - 400 K/uL   nRBC 0.0 0.0 - 0.2 %    Comment: Performed at [facility], [address] [place], [place] [address]  Basic metabolic panel     Status: Abnormal   Collection Time: 04/01/23  6:06 AM  Result Value Ref Range   Sodium 137 135 - 145 mmol/L  Potassium 3.5 3.5 - 5.1 mmol/L   Chloride 104 98 - 111 mmol/L   CO2 25 22 - 32 mmol/L   Glucose, Bld 148 (H) 70 - 99 mg/dL    Comment: Glucose reference range applies only to samples taken after fasting for at least 8 hours.   BUN 11 8 - 23 mg/dL   Creatinine, Ser 1.61 0.44 - 1.00 mg/dL   Calcium 8.5 (L) 8.9 - 10.3 mg/dL   GFR, Estimated >09 >60 mL/min    Comment: (NOTE) Calculated using the CKD-EPI Creatinine Equation  (2021)    Anion gap 8 5 - 15    Comment: Performed at [facility], [address] [place], [place] [address]  Glucose, capillary     Status: Abnormal   Collection Time: 04/01/23  7:58 AM  Result Value Ref Range   Glucose-Capillary 125 (H) 70 - 99 mg/dL    Comment: Glucose reference range applies only to samples taken after fasting for at least 8 hours.    Diabetic Foot Exam: Diabetic Foot Exam - Simple   Simple Foot Form Visual Inspection No deformities, no ulcerations, no other skin breakdown bilaterally: Yes Sensation Testing Intact to touch and monofilament testing bilaterally: Yes Pulse Check Posterior Tibialis and Dorsalis pulse intact bilaterally: Yes Comments     PHQ2/9:    06/02/2023    9:23 AM 03/17/2023   10:25 AM 01/30/2023    8:34 AM 11/28/2022    9:26 AM 09/27/2022    8:58 AM  Depression screen PHQ 2/9  Decreased Interest 0 0 0 0 0  Down, Depressed, Hopeless 0 0 0 0 0  PHQ - 2 Score 0 0 0 0 0  Altered sleeping 0 0 0  0  Tired, decreased energy 0 0 0  0  Change in appetite 0 0 0  0  Feeling bad or failure about yourself  0 0 0  0  Trouble concentrating 0 0 0  0  Moving slowly or fidgety/restless 0 0 0  0  Suicidal thoughts 0 0 0  0  PHQ-9 Score 0 0 0  0  Difficult doing work/chores Not difficult at all Not difficult at all Not difficult at all      phq 9 is negative   Fall Risk:    06/02/2023    9:16 AM 03/17/2023   10:25 AM 01/30/2023    8:34 AM 11/28/2022    9:21 AM 09/27/2022    8:58 AM  Fall Risk   Falls in the past year? 0 0 0 0 0  Number falls in past yr: 0 0 0 0 0  Injury with Fall? 0 0 0 0 0  Risk for fall due to : No Fall Risks No Fall Risks No Fall Risks No Fall Risks No Fall Risks  Follow up Falls prevention discussed;Education provided;Falls evaluation completed Falls prevention discussed;Education provided;Falls evaluation completed Falls prevention discussed;Education provided;Falls evaluation completed Education  provided;Falls prevention discussed Falls prevention discussed     Assessment & Plan  1. Type 2 diabetes mellitus with microalbuminuria, without long-term current use of insulin (HCC) (Primary)  - metFORMIN (GLUCOPHAGE-XR) 750 MG 24 hr tablet; Take 2 tablets (1,500 mg total) by mouth daily with breakfast. TAKE 1 TABLET BY MOUTH EVERY DAY WITH BREAKFAST  Dispense: 180 tablet; Refill: 1  2. Major depressive disorder with single episode, in full remission (HCC)  - citalopram (CELEXA) 20 MG tablet; Take 1 tablet (20 mg total) by mouth daily.  Dispense: 90 tablet;  Refill: 1  3. Dyslipidemia associated with type 2 diabetes mellitus (HCC)  Continue medication  4. Needs flu shot  - Flu Vaccine Trivalent High Dose (Fluad)  5. Arteriosclerosis of coronary artery  - ezetimibe (ZETIA) 10 MG tablet; Take 1 tablet (10 mg total) by mouth daily.  Dispense: 90 tablet; Refill: 1  6. Insomnia, persistent  Continue Ambien  7. Gastroesophageal reflux disease without esophagitis  - Ambulatory referral to Gastroenterology  8. Essential hypertension  - carvedilol (COREG) 3.125 MG tablet; Take 1 tablet (3.125 mg total) by mouth 2 (two) times daily with a meal.  Dispense: 180 tablet; Refill: 0  9. Dyslipidemia  - ezetimibe (ZETIA) 10 MG tablet; Take 1 tablet (10 mg total) by mouth daily.  Dispense: 90 tablet; Refill: 1  10. History of acute myocardial infarction  - carvedilol (COREG) 3.125 MG tablet; Take 1 tablet (3.125 mg total) by mouth 2 (two) times daily with a meal.  Dispense: 180 tablet; Refill: 0  11. Colon cancer screening  - Ambulatory referral to Gastroenterology

## 2023-05-27 ENCOUNTER — Ambulatory Visit
Admission: RE | Admit: 2023-05-27 | Discharge: 2023-05-27 | Disposition: A | Discharge status: Home / Self Care | Payer: Medicare Other | Source: Ambulatory Visit | Attending: Family Medicine | Admitting: Family Medicine

## 2023-05-27 DIAGNOSIS — Z1231 Encounter for screening mammogram for malignant neoplasm of breast: Secondary | ICD-10-CM | POA: Diagnosis not present

## 2023-06-02 ENCOUNTER — Ambulatory Visit (INDEPENDENT_AMBULATORY_CARE_PROVIDER_SITE_OTHER): Payer: Medicare Other | Admitting: Family Medicine

## 2023-06-02 ENCOUNTER — Encounter: Payer: Self-pay | Admitting: Family Medicine

## 2023-06-02 VITALS — BP 138/76 | HR 92 | Resp 16 | Ht 63.0 in | Wt 164.9 lb

## 2023-06-02 DIAGNOSIS — R809 Proteinuria, unspecified: Secondary | ICD-10-CM

## 2023-06-02 DIAGNOSIS — E1169 Type 2 diabetes mellitus with other specified complication: Secondary | ICD-10-CM

## 2023-06-02 DIAGNOSIS — I252 Old myocardial infarction: Secondary | ICD-10-CM

## 2023-06-02 DIAGNOSIS — K219 Gastro-esophageal reflux disease without esophagitis: Secondary | ICD-10-CM | POA: Diagnosis not present

## 2023-06-02 DIAGNOSIS — E1129 Type 2 diabetes mellitus with other diabetic kidney complication: Secondary | ICD-10-CM | POA: Diagnosis not present

## 2023-06-02 DIAGNOSIS — Z23 Encounter for immunization: Secondary | ICD-10-CM

## 2023-06-02 DIAGNOSIS — I2510 Atherosclerotic heart disease of native coronary artery without angina pectoris: Secondary | ICD-10-CM

## 2023-06-02 DIAGNOSIS — G4700 Insomnia, unspecified: Secondary | ICD-10-CM | POA: Diagnosis not present

## 2023-06-02 DIAGNOSIS — Z7984 Long term (current) use of oral hypoglycemic drugs: Secondary | ICD-10-CM | POA: Diagnosis not present

## 2023-06-02 DIAGNOSIS — E785 Hyperlipidemia, unspecified: Secondary | ICD-10-CM

## 2023-06-02 DIAGNOSIS — I10 Essential (primary) hypertension: Secondary | ICD-10-CM

## 2023-06-02 DIAGNOSIS — F325 Major depressive disorder, single episode, in full remission: Secondary | ICD-10-CM | POA: Diagnosis not present

## 2023-06-02 DIAGNOSIS — Z1211 Encounter for screening for malignant neoplasm of colon: Secondary | ICD-10-CM

## 2023-06-02 MED ORDER — CARVEDILOL 3.125 MG PO TABS: 3.1250 mg | ORAL_TABLET | Freq: Two times a day (BID) | ORAL | 0 refills | Status: DC

## 2023-06-02 MED ORDER — METFORMIN HCL ER 750 MG PO TB24: 1500.0000 mg | ORAL_TABLET | Freq: Every day | ORAL | 1 refills | Status: DC

## 2023-06-02 MED ORDER — CITALOPRAM HYDROBROMIDE 20 MG PO TABS: 20.0000 mg | ORAL_TABLET | Freq: Every day | ORAL | 1 refills | Status: DC

## 2023-06-02 MED ORDER — EZETIMIBE 10 MG PO TABS: 10.0000 mg | ORAL_TABLET | Freq: Every day | ORAL | 1 refills | Status: DC

## 2023-06-05 ENCOUNTER — Other Ambulatory Visit: Payer: Self-pay

## 2023-06-05 DIAGNOSIS — R809 Proteinuria, unspecified: Secondary | ICD-10-CM

## 2023-06-05 MED ORDER — METFORMIN HCL ER 750 MG PO TB24: 1500.0000 mg | ORAL_TABLET | Freq: Every day | ORAL | 1 refills | Status: DC

## 2023-06-05 NOTE — Telephone Encounter (Signed)
Instructions are fixed

## 2023-07-09 ENCOUNTER — Other Ambulatory Visit: Payer: Self-pay | Admitting: Family Medicine

## 2023-07-09 DIAGNOSIS — G4700 Insomnia, unspecified: Secondary | ICD-10-CM

## 2023-07-23 NOTE — Progress Notes (Unsigned)
Celso Amy, PA-C [address]  [address]  [place], [place] [address]  Main: [phone]  Fax: [phone]   Gastroenterology Consultation  Referring Provider:     Alba Cory, MD Primary Care Physician:  Alba Cory, MD Primary Gastroenterologist:  Celso Amy, PA-C  Reason for Consultation:     GERD, colon cancer screening        HPI:   Dawn Werner is a 73 y.o. y/o female referred for consultation & management  by Alba Cory, MD.    Patient has noticed a flareup of acid reflux for 6 months.  She has had acid up in her chest with regurgitation after eating certain foods.  She denies dysphagia, abdominal pain, nausea, vomiting, or weight loss.  She has been on omeprazole 40 Mg daily on and off for several years.  GERD improved on medication and returns off medication.  No previous EGD.  06/2013 Colonoscopy by DR. Skulski: Normal.  10 year repeat colonoscopy is due.  She denies family history of colon cancer.  She denies any lower GI symptoms such as rectal bleeding, diarrhea, or weight loss.  She states a stool softener daily with good control of constipation.  Past Medical History:  Diagnosis Date   Anemia    Anxiety    Arthritis    Asthma    Coronary artery disease    Depression    Diabetes mellitus without complication (HCC)    GERD (gastroesophageal reflux disease)    Hyperlipidemia    Hypertension    Insomnia    Myocardial infarction [facility])     Past Surgical History:  Procedure Laterality Date   ABDOMINAL HYSTERECTOMY     BREAST SURGERY     breast reduction   REDUCTION MAMMAPLASTY Bilateral 1995   TOTAL HIP ARTHROPLASTY Left 03/31/2023   Procedure: TOTAL HIP ARTHROPLASTY ANTERIOR APPROACH;  Surgeon: Reinaldo Berber, MD;  Location: [facility] ORS;  Service: Orthopedics;  Laterality: Left;   TUBAL LIGATION      Prior to Admission medications   Medication Sig Start Date End Date Taking? Authorizing Provider  acetaminophen (TYLENOL) 500 MG  tablet Take 1,000 mg by mouth every 6 (six) hours as needed.    [provider]  albuterol (VENTOLIN HFA) 108 (90 Base) MCG/ACT inhaler Inhale 2-4 puffs into the lungs every 6 (six) hours as needed. 05/31/22   Katha Cabal, DO  Blood Glucose Monitoring Suppl (ACCU-CHEK GUIDE ME) w/Device KIT USE AS DIRECTED 09/18/21   Carlynn Purl, Danna Hefty, MD  carvedilol (COREG) 3.125 MG tablet Take 1 tablet (3.125 mg total) by mouth 2 (two) times daily with a meal. 06/02/23   Carlynn Purl, Danna Hefty, MD  citalopram (CELEXA) 20 MG tablet Take 1 tablet (20 mg total) by mouth daily. 06/02/23   Alba Cory, MD  docusate sodium (COLACE) 100 MG capsule Take 1 capsule (100 mg total) by mouth 2 (two) times daily. 04/01/23   Evon Slack, PA-C  ezetimibe (ZETIA) 10 MG tablet Take 1 tablet (10 mg total) by mouth daily. 06/02/23   Alba Cory, MD  losartan (COZAAR) 25 MG tablet Take 1 tablet (25 mg total) by mouth daily. 01/30/23   Alba Cory, MD  metFORMIN (GLUCOPHAGE-XR) 750 MG 24 hr tablet Take 2 tablets (1,500 mg total) by mouth daily with breakfast. 06/05/23   Alba Cory, MD  omeprazole (PRILOSEC) 40 MG capsule Take 1 capsule (40 mg total) by mouth daily. 01/30/23   Alba Cory, MD  rosuvastatin (CRESTOR) 40 MG tablet Take 1 tablet (  40 mg total) by mouth daily. 01/30/23   Alba Cory, MD  zolpidem (AMBIEN) 10 MG tablet TAKE 1 TABLET(10 MG) BY MOUTH AT BEDTIME 07/09/23   Alba Cory, MD    Family History  Problem Relation Age of Onset   Hyperlipidemia Mother    Hypertension Mother    Stroke Mother    Dementia Father    Cancer Sister        breast   Breast cancer Sister 44   Asthma Brother    Depression Brother        bipolar   Heart disease Brother    Heart disease Brother      Social History   Tobacco Use   Smoking status: Never   Smokeless tobacco: Never  Vaping Use   Vaping status: Never Used  Substance Use Topics   Alcohol use: No    Alcohol/week: 0.0 standard drinks  of alcohol   Drug use: No    Allergies as of 07/24/2023 - Review Complete 07/24/2023  Allergen Reaction Noted   Metformin and related Diarrhea 06/13/2015    Review of Systems:    All systems reviewed and negative except where noted in HPI.   Physical Exam:  BP 127/67   Pulse 72   Temp 98.1 F (36.7 C)   Ht 5\' 3"  (1.6 m)   Wt 166 lb (75.3 kg)   BMI 29.41 kg/m  No LMP recorded. Patient has had a hysterectomy.  General:   Alert,  Well-developed, well-nourished, pleasant and cooperative in NAD Lungs:  Respirations even and unlabored.  Clear throughout to auscultation.   No wheezes, crackles, or rhonchi. No acute distress. Heart:  Regular rate and rhythm; no murmurs, clicks, rubs, or gallops. Abdomen:  Normal bowel sounds.  No bruits.  Soft, and non-distended without masses, hepatosplenomegaly or hernias noted.  No Tenderness.  No guarding or rebound tenderness.    Neurologic:  Alert and oriented x3;  grossly normal neurologically. Psych:  Alert and cooperative. Normal mood and affect.  Imaging Studies: No results found.  Assessment and Plan:   Dawn Werner is a 73 y.o. y/o female has been referred for   GERD Continue Omeprazole 40mg  daily. Add OTC Pepcid 20mg  BID or TUMS prn. I recommended patient schedule EGD, however she declined to schedule procedure. Pt. Declined EGD. Recommend Lifestyle Modifications to prevent Acid Reflux.  Rec. Avoid coffee, sodas, peppermint, garlic, onions, alcohol, citrus fruits, chocolate, tomatoes, fatty and spicey foods.  Avoid eating 2-3 hours before bedtime.    2.   Colon Cancer Screening  Cologuard  I recommended patient schedule repeat colonoscopy, however she declined procedure.  She is willing to do a Cologuard test.  Pt. Declined Colonoscopy  Follow up As Needed if worsening GI symptoms.  Celso Amy, PA-C

## 2023-07-24 ENCOUNTER — Ambulatory Visit: Payer: Medicare Other | Admitting: Physician Assistant

## 2023-07-24 ENCOUNTER — Encounter: Payer: Self-pay | Admitting: Physician Assistant

## 2023-07-24 VITALS — BP 127/67 | HR 72 | Temp 98.1°F | Ht 63.0 in | Wt 166.0 lb

## 2023-07-24 DIAGNOSIS — K219 Gastro-esophageal reflux disease without esophagitis: Secondary | ICD-10-CM

## 2023-07-24 DIAGNOSIS — Z1211 Encounter for screening for malignant neoplasm of colon: Secondary | ICD-10-CM

## 2023-07-24 NOTE — Patient Instructions (Signed)
Cologuard form completed and faxed along with insurance card information.

## 2023-08-06 DIAGNOSIS — Z96642 Presence of left artificial hip joint: Secondary | ICD-10-CM | POA: Diagnosis not present

## 2023-08-14 ENCOUNTER — Other Ambulatory Visit: Payer: Self-pay | Admitting: Family Medicine

## 2023-08-14 DIAGNOSIS — I252 Old myocardial infarction: Secondary | ICD-10-CM

## 2023-08-14 DIAGNOSIS — I10 Essential (primary) hypertension: Secondary | ICD-10-CM

## 2023-08-17 ENCOUNTER — Other Ambulatory Visit: Payer: Self-pay | Admitting: Family Medicine

## 2023-08-17 DIAGNOSIS — K219 Gastro-esophageal reflux disease without esophagitis: Secondary | ICD-10-CM

## 2023-08-28 LAB — HM DIABETES EYE EXAM

## 2023-09-01 ENCOUNTER — Ambulatory Visit: Payer: Self-pay | Admitting: Family Medicine

## 2023-09-08 ENCOUNTER — Ambulatory Visit (INDEPENDENT_AMBULATORY_CARE_PROVIDER_SITE_OTHER): Admitting: Family Medicine

## 2023-09-08 ENCOUNTER — Encounter: Payer: Self-pay | Admitting: Family Medicine

## 2023-09-08 ENCOUNTER — Telehealth: Payer: Self-pay

## 2023-09-08 VITALS — BP 132/70 | HR 87 | Resp 16 | Ht 63.0 in | Wt 162.2 lb

## 2023-09-08 DIAGNOSIS — G4700 Insomnia, unspecified: Secondary | ICD-10-CM

## 2023-09-08 DIAGNOSIS — E559 Vitamin D deficiency, unspecified: Secondary | ICD-10-CM

## 2023-09-08 DIAGNOSIS — Z96642 Presence of left artificial hip joint: Secondary | ICD-10-CM

## 2023-09-08 DIAGNOSIS — M5410 Radiculopathy, site unspecified: Secondary | ICD-10-CM

## 2023-09-08 DIAGNOSIS — E1129 Type 2 diabetes mellitus with other diabetic kidney complication: Secondary | ICD-10-CM

## 2023-09-08 DIAGNOSIS — Z1211 Encounter for screening for malignant neoplasm of colon: Secondary | ICD-10-CM

## 2023-09-08 DIAGNOSIS — F325 Major depressive disorder, single episode, in full remission: Secondary | ICD-10-CM

## 2023-09-08 DIAGNOSIS — K219 Gastro-esophageal reflux disease without esophagitis: Secondary | ICD-10-CM

## 2023-09-08 DIAGNOSIS — E1169 Type 2 diabetes mellitus with other specified complication: Secondary | ICD-10-CM | POA: Diagnosis not present

## 2023-09-08 DIAGNOSIS — R202 Paresthesia of skin: Secondary | ICD-10-CM

## 2023-09-08 DIAGNOSIS — I2510 Atherosclerotic heart disease of native coronary artery without angina pectoris: Secondary | ICD-10-CM

## 2023-09-08 DIAGNOSIS — Z79899 Other long term (current) drug therapy: Secondary | ICD-10-CM

## 2023-09-08 DIAGNOSIS — D62 Acute posthemorrhagic anemia: Secondary | ICD-10-CM

## 2023-09-08 LAB — POCT GLYCOSYLATED HEMOGLOBIN (HGB A1C): Hemoglobin A1C: 7.2 % — AB (ref 4.0–5.6)

## 2023-09-08 MED ORDER — PREGABALIN 50 MG PO CAPS: 50.0000 mg | ORAL_CAPSULE | Freq: Two times a day (BID) | ORAL | 0 refills | Status: DC

## 2023-09-08 MED ORDER — CELECOXIB 100 MG PO CAPS: 100.0000 mg | ORAL_CAPSULE | Freq: Two times a day (BID) | ORAL | 0 refills | Status: DC

## 2023-09-08 NOTE — Patient Instructions (Signed)
 Take celebrex 100 mg twice daily with food for one week after that try to go down to once a day  Pre-gablin take one at night for a few days, may go up by one capsule every 3 days to a max of 2 twice daily for tingling/numbness/burning

## 2023-09-08 NOTE — Progress Notes (Signed)
 Name: Dawn Werner   MRN: [id number]    DOB: April 21, 1951   Date:09/08/2023       Progress Note  Subjective  Chief Complaint  Chief Complaint  Patient presents with   Medical Management of Chronic Issues   HPI   MDD  in remission : she still has some marital problems , able to ignore him and sometimes confronts him, she also has close friends.  Since she started on Celexa she seems to be doing better, able to tolerate stress well.  Back pain: described as aching on mid back, radiates down to her flank and causes intermittent tingling and numbness down left or right lower leg. No bowel or bladder incontinence. She has an appointment scheduled with Ortho and will wait to have X-ray, however she is going on a short trip before [place] and would like to take something for the pain. Pain is not constant. It can happen at rest or with activity. No associated with nausea or vomiting. Denies SOB or chest pain    DMII:  last A1C spiked to 7.7 % before hip replacement - left side but changed diet and today is down to 7.2 %    She has history of nephropathy and is on ARB .She denies polyphagia, polydipsia or polyuria. She is taking Metformin ER 1500  mg ER since Dec. She  denies side effects of medications    Hyperlipidemia: she is back on Crestor 40 mg and Zetia 10 mg daily Denies myalgias Last LDL at goal at 61 and she will have labs repeated in April    HTN:  No chest pain, palpitation or SOB , taking medication as prescribed. BP is at goal for her    CAD: no episodes, s/p history of MI in 2009, taking aspirin daily, ARB,  beta-blocker and statin therapy.  Compliant with medications and denies chest pain or decrease in exercise tolerance Not currently seeing cardiologist . We will recheck labs in April    Asthma Mild Intermittent: she has been doing well, no cough, wheezing or SOB lately. She is back on Albuterol prn    Atherosclerosis of aorta: on statin, aspirin  81 mg daily  last LDL at goal.     GERD: she is taking Omeprazole , usually at night, after dinner  She states between 5 until bed time she has a feeling that she has to belch. No regurgitation. She was seen by GI and advised to have EGD but said she would hold off since she was going go have cologuard, but now she is back and wants to have colonoscopy so will discuss EGD with GI again   Insomnia: she had weaned self off but in March she resumed taking it, got worse after hip surgery and is taking Ambien every night     Patient Active Problem List   Diagnosis Date Noted   S/P total left hip arthroplasty 03/31/2023   Mild episode of recurrent major depressive disorder (HCC) 12/13/2021   Radiculitis, cervical 12/13/2021   Left lumbar radiculitis 12/13/2021   Marital problem 10/29/2021   Diabetes mellitus with coincident hypertension (HCC) 07/24/2020   Belching 08/18/2018   Chondromalacia of right knee 01/10/2016   Derangement of posterior horn of lateral meniscus of right knee 01/10/2016   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   Primary osteoarthritis of knee 01/24/2015   Asthma, mild intermittent 11/18/2014   Arteriosclerosis of coronary artery 11/18/2014   Insomnia, persistent 11/18/2014   Dyslipidemia 11/18/2014   Acid reflux 11/18/2014  Diabetes type 2, controlled (HCC) 11/18/2014   Genital herpes in women 11/18/2014   Hypertension, benign 11/18/2014   Asymptomatic postmenopausal status 11/18/2014   History of acute myocardial infarction 11/18/2014    Past Surgical History:  Procedure Laterality Date   ABDOMINAL HYSTERECTOMY     BREAST SURGERY     breast reduction   REDUCTION MAMMAPLASTY Bilateral 1995   TOTAL HIP ARTHROPLASTY Left 03/31/2023   Procedure: TOTAL HIP ARTHROPLASTY ANTERIOR APPROACH;  Surgeon: Reinaldo Berber, MD;  Location: [facility] ORS;  Service: Orthopedics;  Laterality: Left;   TUBAL LIGATION      Family History  Problem Relation Age of Onset   Hyperlipidemia Mother    Hypertension Mother    Stroke  Mother    Dementia Father    Cancer Sister        breast   Breast cancer Sister 42   Asthma Brother    Depression Brother        bipolar   Heart disease Brother    Heart disease Brother     Social History   Tobacco Use   Smoking status: Never   Smokeless tobacco: Never  Substance Use Topics   Alcohol use: No    Alcohol/week: 0.0 standard drinks of alcohol     Current Outpatient Medications:    acetaminophen (TYLENOL) 500 MG tablet, Take 1,000 mg by mouth every 6 (six) hours as needed., Disp: , Rfl:    albuterol (VENTOLIN HFA) 108 (90 Base) MCG/ACT inhaler, Inhale 2-4 puffs into the lungs every 6 (six) hours as needed., Disp: 8 g, Rfl: 0   Blood Glucose Monitoring Suppl (ACCU-CHEK GUIDE ME) w/Device KIT, USE AS DIRECTED, Disp: 1 kit, Rfl: 0   carvedilol (COREG) 3.125 MG tablet, Take 1 tablet (3.125 mg total) by mouth 2 (two) times daily with a meal., Disp: 180 tablet, Rfl: 0   celecoxib (CELEBREX) 100 MG capsule, Take 1 capsule (100 mg total) by mouth 2 (two) times daily., Disp: 60 capsule, Rfl: 0   citalopram (CELEXA) 20 MG tablet, Take 1 tablet (20 mg total) by mouth daily., Disp: 90 tablet, Rfl: 1   docusate sodium (COLACE) 100 MG capsule, Take 1 capsule (100 mg total) by mouth 2 (two) times daily., Disp: , Rfl:    ezetimibe (ZETIA) 10 MG tablet, Take 1 tablet (10 mg total) by mouth daily., Disp: 90 tablet, Rfl: 1   losartan (COZAAR) 25 MG tablet, Take 1 tablet (25 mg total) by mouth daily., Disp: 90 tablet, Rfl: 3   metFORMIN (GLUCOPHAGE-XR) 750 MG 24 hr tablet, Take 2 tablets (1,500 mg total) by mouth daily with breakfast., Disp: 180 tablet, Rfl: 1   omeprazole (PRILOSEC) 40 MG capsule, TAKE 1 CAPSULE(40 MG) BY MOUTH DAILY, Disp: 90 capsule, Rfl: 3   pregabalin (LYRICA) 50 MG capsule, Take 1-2 capsules (50-100 mg total) by mouth 2 (two) times daily., Disp: 120 capsule, Rfl: 0   rosuvastatin (CRESTOR) 40 MG tablet, Take 1 tablet (40 mg total) by mouth daily., Disp: 90 tablet,  Rfl: 3   zolpidem (AMBIEN) 10 MG tablet, TAKE 1 TABLET(10 MG) BY MOUTH AT BEDTIME, Disp: 90 tablet, Rfl: 0  Allergies  Allergen Reactions   Metformin And Related Diarrhea    And nausea. Tolerates XR formulation    I personally reviewed active problem list, medication list, allergies, family history with the patient/caregiver today.   ROS  Ten systems reviewed and is negative except as mentioned in HPI    Objective Physical Exam Constitutional: Patient  appears well-developed and well-nourished. Obese  No distress.  HEENT: head atraumatic, normocephalic, pupils equal and reactive to light, neck supple Cardiovascular: Normal rate, regular rhythm and normal heart sounds.  No murmur heard. No BLE edema. Pulmonary/Chest: Effort normal and breath sounds normal. No respiratory distress. Abdominal: Soft.  There is no tenderness. Muscular skeletal: pain during palpation of mid thoracic spine, no masses or rashes. Is is not in one specific vertebrae. Normal rom back.  Psychiatric: Patient has a normal mood and affect. behavior is normal. Judgment and thought content normal.   Vitals:   09/08/23 1440  BP: 132/70  Pulse: 87  Resp: 16  SpO2: 93%  Weight: 162 lb 3.2 oz (73.6 kg)  Height: 5\' 3"  (1.6 m)    Body mass index is 28.73 kg/m.  Recent Results (from the past 2160 hours)  POCT glycosylated hemoglobin (Hb A1C)     Status: Abnormal   Collection Time: 09/08/23  2:43 PM  Result Value Ref Range   Hemoglobin A1C 7.2 (A) 4.0 - 5.6 %   HbA1c POC (<> result, manual entry)     HbA1c, POC (prediabetic range)     HbA1c, POC (controlled diabetic range)      Diabetic Foot Exam - Simple   Simple Foot Form Visual Inspection No deformities, no ulcerations, no other skin breakdown bilaterally: Yes Sensation Testing Intact to touch and monofilament testing bilaterally: Yes Pulse Check Posterior Tibialis and Dorsalis pulse intact bilaterally: Yes Comments      PHQ2/9:    09/08/2023     2:27 PM 06/02/2023    9:23 AM 03/17/2023   10:25 AM 01/30/2023    8:34 AM 11/28/2022    9:26 AM  Depression screen PHQ 2/9  Decreased Interest 0 0 0 0 0  Down, Depressed, Hopeless 0 0 0 0 0  PHQ - 2 Score 0 0 0 0 0  Altered sleeping 0 0 0 0   Tired, decreased energy 0 0 0 0   Change in appetite 0 0 0 0   Feeling bad or failure about yourself  0 0 0 0   Trouble concentrating 0 0 0 0   Moving slowly or fidgety/restless 0 0 0 0   Suicidal thoughts 0 0 0 0   PHQ-9 Score 0 0 0 0   Difficult doing work/chores Not difficult at all Not difficult at all Not difficult at all Not difficult at all     phq 9 is negative  Fall Risk:    09/08/2023    2:27 PM 06/02/2023    9:16 AM 03/17/2023   10:25 AM 01/30/2023    8:34 AM 11/28/2022    9:21 AM  Fall Risk   Falls in the past year? 0 0 0 0 0  Number falls in past yr: 0 0 0 0 0  Injury with Fall? 0 0 0 0 0  Risk for fall due to : No Fall Risks No Fall Risks No Fall Risks No Fall Risks No Fall Risks  Follow up Falls prevention discussed;Education provided;Falls evaluation completed Falls prevention discussed;Education provided;Falls evaluation completed Falls prevention discussed;Education provided;Falls evaluation completed Falls prevention discussed;Education provided;Falls evaluation completed Education provided;Falls prevention discussed     Assessment & Plan  1. Dyslipidemia associated with type 2 diabetes mellitus (HCC)  - HM DIABETES FOOT EXAM - Microalbumin / creatinine urine ratio - Lipid panel  2. Major depressive disorder with single episode, in full remission (HCC)  Doing well now  3. Type 2 diabetes  mellitus with microalbuminuria, without long-term current use of insulin (HCC) (Primary)  - POCT glycosylated hemoglobin (Hb A1C)  4. Screening for colon cancer  - Ambulatory referral to Gastroenterology  5. Arteriosclerosis of coronary artery  Having thoracic back pain but no diaphoresis, associated with symptoms of  radiculitis. Continue medications  6. Long-term use of high-risk medication  - CBC with Differential/Platelet - COMPLETE METABOLIC PANEL WITHOUT GFR  7. Anemia due to blood loss, acute  - Iron, TIBC and Ferritin Panel  8. Insomnia, persistent  Continue medications  9. Gastroesophageal reflux disease without esophagitis  Going to see GI  10. History of total left hip replacement  Still has a mild limp  11. Back pain with radiculopathy  - celecoxib (CELEBREX) 100 MG capsule; Take 1 capsule (100 mg total) by mouth 2 (two) times daily.  Dispense: 60 capsule; Refill: 0 - pregabalin (LYRICA) 50 MG capsule; Take 1-2 capsules (50-100 mg total) by mouth 2 (two) times daily.  Dispense: 120 capsule; Refill: 0  12. Vitamin D deficiency  - VITAMIN D 25 Hydroxy (Vit-D Deficiency, Fractures)  13. Paresthesia  - B12 and Folate Panel

## 2023-09-08 NOTE — Telephone Encounter (Signed)
 Hi [name],  Dr. Carlynn Purl secure messaged me inquiring about why patient could not schedule her colonoscopy with [place].  I sent Dr. Carlynn Purl the following A/P from patients visit with [name] indicating patient declined to schedule colonoscopy w/EGD.  Dr. Carlynn Purl stated that patient would like to proceed with having screening colonoscopy scheduled, unsure about EGD.  Informed Dr. Carlynn Purl that I would message you to call patient to schedule colonoscopy and discuss if she wants EGD as [name] recommended during visit. -------------------------------------------------------------- Per chart note dated 07/24/23 office visit with Dawn Werner,  "[place] is a 73 y.o. y/o female has been referred for   1. GERD Continue Omeprazole 40mg  daily. Add OTC Pepcid 20mg  BID or TUMS prn. I recommended patient schedule EGD, however she declined to schedule procedure. Pt. Declined EGD. Recommend Lifestyle Modifications to prevent Acid Reflux. Rec. Avoid coffee, sodas, peppermint, garlic, onions, alcohol, citrus fruits, chocolate, tomatoes, fatty and spicey foods. Avoid eating 2-3 hours before bedtime.   2. Colon Cancer Screening Cologuard I recommended patient schedule repeat colonoscopy, however she declined procedure. She is willing to do a Cologuard test. Pt. Declined Colonoscopy".  Thanks, [place], [place]

## 2023-09-09 ENCOUNTER — Other Ambulatory Visit: Payer: Self-pay

## 2023-09-09 ENCOUNTER — Telehealth: Payer: Self-pay

## 2023-09-09 DIAGNOSIS — Z1211 Encounter for screening for malignant neoplasm of colon: Secondary | ICD-10-CM

## 2023-09-09 MED ORDER — PEG 3350-KCL-NA BICARB-NACL 420 G PO SOLR: 4000.0000 mL | Freq: Once | ORAL | 0 refills | Status: AC

## 2023-09-09 NOTE — Telephone Encounter (Signed)
 Spoke with patient-scheduled Colonoscopy for 10/23/23 with Dr.Anna -declined EGD- Golytely sent to pharmacy-discussed holding Metformin 2 days prior- mailed instructions to patient

## 2023-10-06 ENCOUNTER — Other Ambulatory Visit: Payer: Self-pay | Admitting: Family Medicine

## 2023-10-06 DIAGNOSIS — M5410 Radiculopathy, site unspecified: Secondary | ICD-10-CM

## 2023-10-07 ENCOUNTER — Other Ambulatory Visit: Payer: Self-pay | Admitting: Family Medicine

## 2023-10-07 DIAGNOSIS — G4700 Insomnia, unspecified: Secondary | ICD-10-CM

## 2023-10-16 ENCOUNTER — Encounter (HOSPITAL_COMMUNITY): Payer: Self-pay

## 2023-10-23 ENCOUNTER — Ambulatory Visit
Admission: RE | Admit: 2023-10-23 | Discharge: 2023-10-23 | Disposition: A | Discharge status: Home / Self Care | Attending: Gastroenterology | Admitting: Gastroenterology

## 2023-10-23 ENCOUNTER — Encounter
Admission: RE | Disposition: A | Discharge status: Home / Self Care | Payer: Self-pay | Source: Home / Self Care | Attending: Gastroenterology

## 2023-10-23 ENCOUNTER — Other Ambulatory Visit: Payer: Self-pay

## 2023-10-23 ENCOUNTER — Ambulatory Visit: Admitting: Certified Registered"

## 2023-10-23 ENCOUNTER — Encounter: Payer: Self-pay | Admitting: Gastroenterology

## 2023-10-23 DIAGNOSIS — I2510 Atherosclerotic heart disease of native coronary artery without angina pectoris: Secondary | ICD-10-CM | POA: Insufficient documentation

## 2023-10-23 DIAGNOSIS — I10 Essential (primary) hypertension: Secondary | ICD-10-CM | POA: Insufficient documentation

## 2023-10-23 DIAGNOSIS — J45909 Unspecified asthma, uncomplicated: Secondary | ICD-10-CM | POA: Insufficient documentation

## 2023-10-23 DIAGNOSIS — I252 Old myocardial infarction: Secondary | ICD-10-CM | POA: Diagnosis not present

## 2023-10-23 DIAGNOSIS — E119 Type 2 diabetes mellitus without complications: Secondary | ICD-10-CM | POA: Diagnosis not present

## 2023-10-23 DIAGNOSIS — Z7984 Long term (current) use of oral hypoglycemic drugs: Secondary | ICD-10-CM | POA: Diagnosis not present

## 2023-10-23 DIAGNOSIS — Z1211 Encounter for screening for malignant neoplasm of colon: Secondary | ICD-10-CM | POA: Insufficient documentation

## 2023-10-23 DIAGNOSIS — K5730 Diverticulosis of large intestine without perforation or abscess without bleeding: Secondary | ICD-10-CM | POA: Diagnosis not present

## 2023-10-23 DIAGNOSIS — K219 Gastro-esophageal reflux disease without esophagitis: Secondary | ICD-10-CM | POA: Insufficient documentation

## 2023-10-23 HISTORY — PX: COLONOSCOPY: SHX5424

## 2023-10-23 LAB — GLUCOSE, CAPILLARY: Glucose-Capillary: 121 mg/dL — ABNORMAL HIGH (ref 70–99)

## 2023-10-23 SURGERY — COLONOSCOPY
Anesthesia: General

## 2023-10-23 MED ORDER — PROPOFOL 500 MG/50ML IV EMUL
INTRAVENOUS | Status: DC | PRN
  Administered 2023-10-23: 150 ug/kg/min via INTRAVENOUS

## 2023-10-23 MED ORDER — SODIUM CHLORIDE 0.9 % IV SOLN
INTRAVENOUS | Status: DC
Start: 2023-10-23 — End: 2023-10-23

## 2023-10-23 MED ORDER — PROPOFOL 10 MG/ML IV BOLUS
INTRAVENOUS | Status: DC | PRN
  Administered 2023-10-23: 60 mg via INTRAVENOUS

## 2023-10-23 MED ORDER — LIDOCAINE HCL (CARDIAC) PF 100 MG/5ML IV SOSY
PREFILLED_SYRINGE | INTRAVENOUS | Status: DC | PRN
  Administered 2023-10-23: 50 mg via INTRAVENOUS

## 2023-10-23 NOTE — H&P (Signed)
 [name], MD [address], [address], [place], [place], [address] [address], [address], [place], [place], [address] Phone: [phone]  Fax: [phone]  Primary Care Physician:  Arleen Lacer, MD   Pre-Procedure History & Physical: HPI:  Dawn Werner is a 73 y.o. female is here for an colonoscopy.   Past Medical History:  Diagnosis Date   Anemia    Anxiety    Arthritis    Asthma    Coronary artery disease    Depression    Diabetes mellitus without complication (HCC)    GERD (gastroesophageal reflux disease)    Hyperlipidemia    Hypertension    Insomnia    Myocardial infarction [facility])     Past Surgical History:  Procedure Laterality Date   ABDOMINAL HYSTERECTOMY     BREAST SURGERY     breast reduction   REDUCTION MAMMAPLASTY Bilateral 1995   TOTAL HIP ARTHROPLASTY Left 03/31/2023   Procedure: TOTAL HIP ARTHROPLASTY ANTERIOR APPROACH;  Surgeon: Venus Ginsberg, MD;  Location: [facility] ORS;  Service: Orthopedics;  Laterality: Left;   TUBAL LIGATION      Prior to Admission medications   Medication Sig Start Date End Date Taking? Authorizing Provider  acetaminophen  (TYLENOL ) 500 MG tablet Take 1,000 mg by mouth every 6 (six) hours as needed.    [provider]  albuterol  (VENTOLIN  HFA) 108 (90 Base) MCG/ACT inhaler Inhale 2-4 puffs into the lungs every 6 (six) hours as needed. 05/31/22   Brimage, Vondra, DO  Blood Glucose Monitoring Suppl (ACCU-CHEK GUIDE ME) w/Device KIT USE AS DIRECTED 09/18/21   Sowles, Krichna, MD  carvedilol  (COREG ) 3.125 MG tablet Take 1 tablet (3.125 mg total) by mouth 2 (two) times daily with a meal. 06/02/23   Sowles, Arvis Laura, MD  celecoxib  (CELEBREX ) 100 MG capsule TAKE 1 CAPSULE(100 MG) BY MOUTH TWICE DAILY 10/06/23   Sowles, Krichna, MD  citalopram  (CELEXA ) 20 MG tablet Take 1 tablet (20 mg total) by mouth daily. 06/02/23   Sowles, Krichna, MD  docusate sodium  (COLACE) 100 MG capsule Take 1 capsule (100 mg total) by  mouth 2 (two) times daily. 04/01/23   Coralyn Derry, PA-C  ezetimibe  (ZETIA ) 10 MG tablet Take 1 tablet (10 mg total) by mouth daily. 06/02/23   Sowles, Krichna, MD  losartan  (COZAAR ) 25 MG tablet Take 1 tablet (25 mg total) by mouth daily. 01/30/23   Sowles, Krichna, MD  metFORMIN  (GLUCOPHAGE -XR) 750 MG 24 hr tablet Take 2 tablets (1,500 mg total) by mouth daily with breakfast. 06/05/23   Arleen Lacer, MD  omeprazole  (PRILOSEC) 40 MG capsule TAKE 1 CAPSULE(40 MG) BY MOUTH DAILY 08/18/23   Sowles, Krichna, MD  pregabalin  (LYRICA ) 50 MG capsule Take 1-2 capsules (50-100 mg total) by mouth 2 (two) times daily. 09/08/23   Sowles, Krichna, MD  rosuvastatin  (CRESTOR ) 40 MG tablet Take 1 tablet (40 mg total) by mouth daily. 01/30/23   Sowles, Krichna, MD  zolpidem  (AMBIEN ) 10 MG tablet TAKE 1 TABLET(10 MG) BY MOUTH AT BEDTIME 10/07/23   Sowles, Krichna, MD    Allergies as of 09/09/2023 - Review Complete 09/08/2023  Allergen Reaction Noted   Metformin  and related Diarrhea 06/13/2015    Family History  Problem Relation Age of Onset   Hyperlipidemia Mother    Hypertension Mother    Stroke Mother    Dementia Father    Cancer Sister        breast   Breast cancer Sister 37   Asthma Brother  Depression Brother        bipolar   Heart disease Brother    Heart disease Brother     Social History   Socioeconomic History   Marital status: Married    Spouse name: [name]   Number of children: 2   Years of education: Not on file   Highest education level: 12th grade  Occupational History   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  Tobacco Use   Smoking status: Never   Smokeless tobacco: Never  Vaping Use   Vaping status: Never Used  Substance and Sexual Activity   Alcohol use: No    Alcohol/week: 0.0 standard drinks of alcohol   Drug use: No   Sexual activity: Yes    Partners: Male  Other Topics Concern   Not on file  Social History Narrative   Not on file   Social Drivers of  Health   Financial Resource Strain: Low Risk  (11/28/2022)   Overall Financial Resource Strain (CARDIA)    Difficulty of Paying Living Expenses: Not hard at all  Food Insecurity: No Food Insecurity (03/31/2023)   Hunger Vital Sign    Worried About Running Out of Food in the Last Year: Never true    Ran Out of Food in the Last Year: Never true  Transportation Needs: No Transportation Needs (03/31/2023)   PRAPARE - Administrator, Civil Service (Medical): No    Lack of Transportation (Non-Medical): No  Physical Activity: Sufficiently Active (11/28/2022)   Exercise Vital Sign    Days of Exercise per Week: 7 days    Minutes of Exercise per Session: 30 min  Stress: No Stress Concern Present (11/28/2022)   [employer] of Occupational Health - Occupational Stress Questionnaire    Feeling of Stress : Not at all  Social Connections: Socially Integrated (11/28/2022)   Social Connection and Isolation Panel [NHANES]    Frequency of Communication with Friends and Family: More than three times a week    Frequency of Social Gatherings with Friends and Family: More than three times a week    Attends Religious Services: More than 4 times per year    Active Member of [employer] or Organizations: Yes    Attends Engineer, structural: More than 4 times per year    Marital Status: Married  Catering manager Violence: Not At Risk (03/31/2023)   Humiliation, Afraid, Rape, and Kick questionnaire    Fear of Current or Ex-Partner: No    Emotionally Abused: No    Physically Abused: No    Sexually Abused: No    Review of Systems: See HPI, otherwise negative ROS  Physical Exam: There were no vitals taken for this visit. General:   Alert,  pleasant and cooperative in NAD Head:  Normocephalic and atraumatic. Neck:  Supple; no masses or thyromegaly. Lungs:  Clear throughout to auscultation, normal respiratory effort.    Heart:  +S1, +S2, Regular rate and rhythm, No edema. Abdomen:  Soft,  nontender and nondistended. Normal bowel sounds, without guarding, and without rebound.   Neurologic:  Alert and  oriented x4;  grossly normal neurologically.  Impression/Plan: Dawn Werner is here for an colonoscopy to be performed for Screening colonoscopy average risk   Risks, benefits, limitations, and alternatives regarding  colonoscopy have been reviewed with the patient.  Questions have been answered.  All parties agreeable.   [name], MD  10/23/2023, 8:45 AM

## 2023-10-23 NOTE — Anesthesia Preprocedure Evaluation (Signed)
 Anesthesia Evaluation  Patient identified by MRN, date of birth, ID band Patient awake    Reviewed: Allergy & Precautions, NPO status , Patient's Chart, lab work & pertinent test results  History of Anesthesia Complications Negative for: history of anesthetic complications  Airway Mallampati: III  TM Distance: <3 FB Neck ROM: full    Dental  (+) Upper Dentures, Lower Dentures   Pulmonary asthma    Pulmonary exam normal        Cardiovascular Exercise Tolerance: Good hypertension, (-) angina + CAD and + Past MI  Normal cardiovascular exam     Neuro/Psych  PSYCHIATRIC DISORDERS       Neuromuscular disease    GI/Hepatic Neg liver ROS,GERD  Controlled,,  Endo/Other  diabetes, Type 2    Renal/GU negative Renal ROS  negative genitourinary   Musculoskeletal   Abdominal   Peds  Hematology negative hematology ROS (+)   Anesthesia Other Findings Past Medical History: No date: Anemia No date: Anxiety No date: Arthritis No date: Asthma No date: Coronary artery disease No date: Depression No date: Diabetes mellitus without complication (HCC) No date: GERD (gastroesophageal reflux disease) No date: Hyperlipidemia No date: Hypertension No date: Insomnia No date: Myocardial infarction [facility])  Past Surgical History: No date: ABDOMINAL HYSTERECTOMY No date: BREAST SURGERY     Comment:  breast reduction 1995: REDUCTION MAMMAPLASTY; Bilateral 03/31/2023: TOTAL HIP ARTHROPLASTY; Left     Comment:  Procedure: TOTAL HIP ARTHROPLASTY ANTERIOR APPROACH;                Surgeon: Venus Ginsberg, MD;  Location: [facility] ORS;                Service: Orthopedics;  Laterality: Left; No date: TUBAL LIGATION  BMI    Body Mass Index: 29.05 kg/m      Reproductive/Obstetrics negative OB ROS                             Anesthesia Physical Anesthesia Plan  ASA: 3  Anesthesia Plan: General   Post-op Pain  Management:    Induction: Intravenous  PONV Risk Score and Plan: Propofol  infusion and TIVA  Airway Management Planned: Natural Airway and Nasal Cannula  Additional Equipment:   Intra-op Plan:   Post-operative Plan:   Informed Consent: I have reviewed the patients History and Physical, chart, labs and discussed the procedure including the risks, benefits and alternatives for the proposed anesthesia with the patient or authorized representative who has indicated his/her understanding and acceptance.     Dental Advisory Given  Plan Discussed with: Anesthesiologist, CRNA and Surgeon  Anesthesia Plan Comments: (Patient consented for risks of anesthesia including but not limited to:  - adverse reactions to medications - risk of airway placement if required - damage to eyes, teeth, lips or other oral mucosa - nerve damage due to positioning  - sore throat or hoarseness - Damage to heart, brain, nerves, lungs, other parts of body or loss of life  Patient voiced understanding and assent.)       Anesthesia Quick Evaluation

## 2023-10-23 NOTE — Op Note (Signed)
 [facility] Gastroenterology Patient Name: Dawn  Werner Procedure Date: 10/23/2023 9:19 AM MRN: [id number] Account #: [id number] Date of Birth: 10-06-1950 Admit Type: Outpatient Age: 73 Room: [facility] ENDO ROOM 3 Gender: Female Note Status: Finalized Instrument Name: Charlyn Cooley [id number] Procedure:             Colonoscopy Indications:           Screening for colorectal malignant neoplasm Providers:             [name] MD, MD Referring MD:          Lavonna Prader. Sowles, MD (Referring MD) Medicines:             Monitored Anesthesia Care Complications:         No immediate complications. Procedure:             Pre-Anesthesia Assessment:                        - Prior to the procedure, a History and Physical was                         performed, and patient medications, allergies and                         sensitivities were reviewed. The patient's tolerance                         of previous anesthesia was reviewed.                        - The risks and benefits of the procedure and the                         sedation options and risks were discussed with the                         patient. All questions were answered and informed                         consent was obtained.                        - ASA Grade Assessment: II - A patient with mild                         systemic disease.                        After obtaining informed consent, the colonoscope was                         passed under direct vision. Throughout the procedure,                         the patient's blood pressure, pulse, and oxygen                         saturations were monitored continuously. The                         Colonoscope was  introduced through the anus and                         advanced to the the cecum, identified by the                         appendiceal orifice. The colonoscopy was performed                         with ease. The patient tolerated the procedure  well.                         The quality of the bowel preparation was good. The                         ileocecal valve, appendiceal orifice, and rectum were                         photographed. Findings:      The perianal and digital rectal examinations were normal.      Multiple medium-mouthed diverticula were found in the left colon.      The exam was otherwise without abnormality on direct and retroflexion       views.      The exam was otherwise without abnormality on direct and retroflexion       views. Impression:            - Diverticulosis in the left colon.                        - The examination was otherwise normal on direct and                         retroflexion views.                        - The examination was otherwise normal on direct and                         retroflexion views.                        - No specimens collected. Recommendation:        - Discharge patient to home (with escort).                        - Resume previous diet.                        - Continue present medications.                        - Repeat colonoscopy is not recommended due to current                         age (8 years or older) for screening purposes. Procedure Code(s):     --- Professional ---                        [phone], Colonoscopy, flexible; diagnostic, including  collection of specimen(s) by brushing or washing, when                         performed (separate procedure) CPT copyright 2022 American Medical Association. All rights reserved. The codes documented in this report are preliminary and upon coder review may  be revised to meet current compliance requirements. [name], MD [name] MD, MD 10/23/2023 9:52:38 AM This report has been signed electronically. Number of Addenda: 0 Note Initiated On: 10/23/2023 9:19 AM Scope Withdrawal Time: 0 hours 10 minutes 54 seconds  Total Procedure Duration: 0 hours 16 minutes 50 seconds   Estimated Blood Loss:  Estimated blood loss: none.      [facility]

## 2023-10-23 NOTE — Transfer of Care (Signed)
 Immediate Anesthesia Transfer of Care Note  Patient: Dawn  A Werner  Procedure(s) Performed: COLONOSCOPY  Patient Location: PACU and Endoscopy Unit  Anesthesia Type:General  Level of Consciousness: awake and drowsy  Airway & Oxygen Therapy: Patient Spontanous Breathing  Post-op Assessment: Report given to RN and Post -op Vital signs reviewed and stable  Post vital signs: Reviewed and stable  Last Vitals:  Vitals Value Taken Time  BP 91/59 10/23/23 0954  Temp 35.6 C 10/23/23 0954  Pulse 81 10/23/23 0955  Resp 16 10/23/23 0955  SpO2 94 % 10/23/23 0955  Vitals shown include unfiled device data.  Last Pain:  Vitals:   10/23/23 0954  TempSrc: Temporal  PainSc: 0-No pain         Complications: No notable events documented.

## 2023-10-23 NOTE — Anesthesia Postprocedure Evaluation (Signed)
 Anesthesia Post Note  Patient: Clementina  A Conway  Procedure(s) Performed: COLONOSCOPY  Patient location during evaluation: Endoscopy Anesthesia Type: General Level of consciousness: awake and alert Pain management: pain level controlled Vital Signs Assessment: post-procedure vital signs reviewed and stable Respiratory status: spontaneous breathing, nonlabored ventilation, respiratory function stable and patient connected to nasal cannula oxygen Cardiovascular status: blood pressure returned to baseline and stable Postop Assessment: no apparent nausea or vomiting Anesthetic complications: no   No notable events documented.   Last Vitals:  Vitals:   10/23/23 0954 10/23/23 1014  BP: (!) 91/59   Pulse:    Resp:    Temp: (!) 35.6 C   SpO2:  96%    Last Pain:  Vitals:   10/23/23 1014  TempSrc:   PainSc: 0-No pain                 Portia Brittle Rayni Nemitz

## 2023-10-23 NOTE — Anesthesia Procedure Notes (Signed)
 Procedure Name: MAC Date/Time: 10/23/2023 9:30 AM  Performed by: Orin Birk, CRNAPre-anesthesia Checklist: Patient identified, Emergency Drugs available, Suction available and Patient being monitored Patient Re-evaluated:Patient Re-evaluated prior to induction Oxygen Delivery Method: Nasal cannula

## 2023-11-20 ENCOUNTER — Other Ambulatory Visit: Payer: Self-pay | Admitting: Family Medicine

## 2023-11-20 DIAGNOSIS — I10 Essential (primary) hypertension: Secondary | ICD-10-CM

## 2023-11-20 DIAGNOSIS — I252 Old myocardial infarction: Secondary | ICD-10-CM

## 2023-12-05 ENCOUNTER — Ambulatory Visit (INDEPENDENT_AMBULATORY_CARE_PROVIDER_SITE_OTHER)

## 2023-12-05 VITALS — BP 91/59 | Ht 63.0 in | Wt 160.0 lb

## 2023-12-05 DIAGNOSIS — Z0000 Encounter for general adult medical examination without abnormal findings: Secondary | ICD-10-CM

## 2023-12-05 DIAGNOSIS — Z2821 Immunization not carried out because of patient refusal: Secondary | ICD-10-CM

## 2023-12-05 NOTE — Progress Notes (Signed)
 Because this visit was a virtual/telehealth visit,  certain criteria was not obtained, such a blood pressure, CBG if applicable, and timed get up and go. Any medications not marked as taking were not mentioned during the medication reconciliation part of the visit. Any vitals not documented were not able to be obtained due to this being a telehealth visit or patient was unable to self-report a recent blood pressure reading due to a lack of equipment at home via telehealth. Vitals that have been documented are verbally provided by the patient.  This visit was performed by a medical professional under my direct supervision. I was immediately available for consultation/collaboration. I have reviewed and agree with the Annual Wellness Visit documentation.   Subjective:   Dawn  A Werner is a 73 y.o. who presents for a Medicare Wellness preventive visit.  As a reminder, Annual Wellness Visits don't include a physical exam, and some assessments may be limited, especially if this visit is performed virtually. We may recommend an in-person follow-up visit with your provider if needed.  Visit Complete: Virtual I connected with  Dawn  A Werner on 12/05/23 by a audio enabled telemedicine application and verified that I am speaking with the correct person using two identifiers.  Patient Location: Home  Provider Location: Home Office  I discussed the limitations of evaluation and management by telemedicine. The patient expressed understanding and agreed to proceed.  Vital Signs: Because this visit was a virtual/telehealth visit, some criteria may be missing or patient reported. Any vitals not documented were not able to be obtained and vitals that have been documented are patient reported.  VideoDeclined- This patient declined Librarian, academic. Therefore the visit was completed with audio only.  Persons Participating in Visit: Patient.  AWV Questionnaire: No: Patient  Medicare AWV questionnaire was not completed prior to this visit.  Cardiac Risk Factors include: advanced age (>76men, >28 women);hypertension;diabetes mellitus;dyslipidemia     Objective:    Today's Vitals   12/05/23 0909  BP: (!) 91/59  Weight: 160 lb (72.6 kg)  Height: 5' 3 (1.6 m)   Body mass index is 28.34 kg/m.     12/05/2023    9:09 AM 10/23/2023    9:05 AM 03/31/2023    7:00 AM 03/20/2023    8:52 AM 11/28/2022    9:30 AM 05/31/2022   10:47 AM 11/20/2021   11:33 AM  Advanced Directives  Does Patient Have a Medical Advance Directive? Yes No No No No No No  Type of Estate agent of [place];Living will        Does patient want to make changes to medical advance directive? No - Patient declined        Copy of Healthcare Power of Attorney in Chart? No - copy requested        Would patient like information on creating a medical advance directive?  No - Patient declined No - Patient declined    No - Patient declined    Current Medications (verified) Outpatient Encounter Medications as of 12/05/2023  Medication Sig   acetaminophen  (TYLENOL ) 500 MG tablet Take 1,000 mg by mouth every 6 (six) hours as needed.   albuterol  (VENTOLIN  HFA) 108 (90 Base) MCG/ACT inhaler Inhale 2-4 puffs into the lungs every 6 (six) hours as needed.   Blood Glucose Monitoring Suppl (ACCU-CHEK GUIDE ME) w/Device KIT USE AS DIRECTED   carvedilol  (COREG ) 3.125 MG tablet TAKE 1 TABLET(3.125 MG) BY MOUTH TWICE DAILY WITH A MEAL   celecoxib  (  CELEBREX ) 100 MG capsule TAKE 1 CAPSULE(100 MG) BY MOUTH TWICE DAILY   citalopram  (CELEXA ) 20 MG tablet Take 1 tablet (20 mg total) by mouth daily.   docusate sodium  (COLACE) 100 MG capsule Take 1 capsule (100 mg total) by mouth 2 (two) times daily.   ezetimibe  (ZETIA ) 10 MG tablet Take 1 tablet (10 mg total) by mouth daily.   losartan  (COZAAR ) 25 MG tablet Take 1 tablet (25 mg total) by mouth daily.   metFORMIN  (GLUCOPHAGE -XR) 750 MG 24 hr tablet  Take 2 tablets (1,500 mg total) by mouth daily with breakfast.   omeprazole  (PRILOSEC) 40 MG capsule TAKE 1 CAPSULE(40 MG) BY MOUTH DAILY   pregabalin  (LYRICA ) 50 MG capsule Take 1-2 capsules (50-100 mg total) by mouth 2 (two) times daily.   rosuvastatin  (CRESTOR ) 40 MG tablet Take 1 tablet (40 mg total) by mouth daily.   zolpidem  (AMBIEN ) 10 MG tablet TAKE 1 TABLET(10 MG) BY MOUTH AT BEDTIME   No facility-administered encounter medications on file as of 12/05/2023.    Allergies (verified) Metformin  and related   History: Past Medical History:  Diagnosis Date   Anemia    Anxiety    Arthritis    Asthma    Coronary artery disease    Depression    Diabetes mellitus without complication (HCC)    GERD (gastroesophageal reflux disease)    Hyperlipidemia    Hypertension    Insomnia    Myocardial infarction [facility])    Past Surgical History:  Procedure Laterality Date   ABDOMINAL HYSTERECTOMY     BREAST SURGERY     breast reduction   COLONOSCOPY N/A 10/23/2023   Procedure: COLONOSCOPY;  Surgeon: Therisa Bi, MD;  Location: [facility] ENDOSCOPY;  Service: Gastroenterology;  Laterality: N/A;   REDUCTION MAMMAPLASTY Bilateral 1995   TOTAL HIP ARTHROPLASTY Left 03/31/2023   Procedure: TOTAL HIP ARTHROPLASTY ANTERIOR APPROACH;  Surgeon: Lorelle Hussar, MD;  Location: [facility] ORS;  Service: Orthopedics;  Laterality: Left;   TUBAL LIGATION     Family History  Problem Relation Age of Onset   Hyperlipidemia Mother    Hypertension Mother    Stroke Mother    Dementia Father    Cancer Sister        breast   Breast cancer Sister 50   Asthma Brother    Depression Brother        bipolar   Heart disease Brother    Heart disease Brother    Social History   Socioeconomic History   Marital status: Married    Spouse name: Dawn Werner   Number of children: 2   Years of education: Not on file   Highest education level: 12th grade  Occupational History   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   Tobacco Use   Smoking status: Never   Smokeless tobacco: Never  Vaping Use   Vaping status: Never Used  Substance and Sexual Activity   Alcohol use: No    Alcohol/week: 0.0 standard drinks of alcohol   Drug use: No   Sexual activity: Yes    Partners: Male  Other Topics Concern   Not on file  Social History Narrative   Not on file   Social Drivers of Health   Financial Resource Strain: Low Risk  (12/05/2023)   Overall Financial Resource Strain (CARDIA)    Difficulty of Paying Living Expenses: Not hard at all  Food Insecurity: No Food Insecurity (12/05/2023)   Hunger Vital Sign    Worried About Running  Out of Food in the Last Year: Never true    Ran Out of Food in the Last Year: Never true  Transportation Needs: No Transportation Needs (12/05/2023)   PRAPARE - Administrator, Civil Service (Medical): No    Lack of Transportation (Non-Medical): No  Physical Activity: Sufficiently Active (12/05/2023)   Exercise Vital Sign    Days of Exercise per Week: 7 days    Minutes of Exercise per Session: 120 min  Stress: No Stress Concern Present (12/05/2023)   [employer] of Occupational Health - Occupational Stress Questionnaire    Feeling of Stress: Not at all  Social Connections: Socially Integrated (12/05/2023)   Social Connection and Isolation Panel    Frequency of Communication with Friends and Family: More than three times a week    Frequency of Social Gatherings with Friends and Family: More than three times a week    Attends Religious Services: More than 4 times per year    Active Member of [employer] or Organizations: Yes    Attends Engineer, structural: More than 4 times per year    Marital Status: Married    Tobacco Counseling Counseling given: Not Answered    Clinical Intake:  Pre-visit preparation completed: Yes  Pain : No/denies pain     BMI - recorded: 28.34 Nutritional Status: BMI 25 -29 Overweight Nutritional Risks: None Diabetes:  Yes CBG done?: No Did pt. bring in CBG monitor from home?: No  Lab Results  Component Value Date   HGBA1C 7.2 (A) 09/08/2023   HGBA1C 7.7 (H) 03/17/2023   HGBA1C 6.4 10/17/2022     How often do you need to have someone help you when you read instructions, pamphlets, or other written materials from your doctor or pharmacy?: 1 - Never What is the last grade level you completed in school?: 12th grade  Interpreter Needed?: No  Information entered by :: Jessi Jessop,cma   Activities of Daily Living     12/05/2023    9:15 AM 03/31/2023   10:42 AM  In your present state of health, do you have any difficulty performing the following activities:  Hearing? 0   Vision? 0   Difficulty concentrating or making decisions? 0   Walking or climbing stairs? 0   Dressing or bathing? 0   Doing errands, shopping? 0 0  Preparing Food and eating ? N   Using the Toilet? N   In the past six months, have you accidently leaked urine? N   Do you have problems with loss of bowel control? N   Managing your Medications? N   Managing your Finances? N   Housekeeping or managing your Housekeeping? N     Patient Care Team: Sowles, Krichna, MD as PCP - General (Family Medicine)  I have updated your Care Teams any recent Medical Services you may have received from other providers in the past year.     Assessment:   This is a routine wellness examination for [place] .  Hearing/Vision screen Hearing Screening - Comments:: Patient has no difficulties hearing  Vision Screening - Comments:: Patient wears glasses   Goals Addressed             This Visit's Progress    DIET - INCREASE WATER INTAKE   On track    Recommend to drink at least 6-8 8oz glasses of water per day.       Depression Screen     12/05/2023    9:16 AM 09/08/2023  2:27 PM 06/02/2023    9:23 AM 03/17/2023   10:25 AM 01/30/2023    8:34 AM 11/28/2022    9:26 AM 09/27/2022    8:58 AM  PHQ 2/9 Scores  PHQ - 2 Score 0 0 0 0  0 0 0  PHQ- 9 Score 3 0 0 0 0  0    Fall Risk     12/05/2023    9:14 AM 09/08/2023    2:27 PM 06/02/2023    9:16 AM 03/17/2023   10:25 AM 01/30/2023    8:34 AM  Fall Risk   Falls in the past year? 0 0 0 0 0  Number falls in past yr: 0 0 0 0 0  Injury with Fall? 0 0 0 0 0  Risk for fall due to : No Fall Risks No Fall Risks No Fall Risks No Fall Risks No Fall Risks  Follow up Falls evaluation completed Falls prevention discussed;Education provided;Falls evaluation completed Falls prevention discussed;Education provided;Falls evaluation completed Falls prevention discussed;Education provided;Falls evaluation completed Falls prevention discussed;Education provided;Falls evaluation completed    MEDICARE RISK AT HOME:  Medicare Risk at Home Any stairs in or around the home?: Yes If so, are there any without handrails?: No Home free of loose throw rugs in walkways, pet beds, electrical cords, etc?: Yes Adequate lighting in your home to reduce risk of falls?: Yes Life alert?: No Use of a cane, walker or w/c?: No Grab bars in the bathroom?: Yes Shower chair or bench in shower?: Yes Elevated toilet seat or a handicapped toilet?: Yes  TIMED UP AND GO:  Was the test performed?  No  Cognitive Function: 6CIT completed        12/05/2023    9:12 AM 11/28/2022    9:32 AM 10/19/2019    8:54 AM 10/15/2018    9:08 AM 10/10/2017    8:33 AM  6CIT Screen  What Year? 0 points 0 points 0 points 0 points 0 points  What month? 0 points 0 points 0 points 0 points 0 points  What time? 0 points 0 points 0 points 0 points 0 points  Count back from 20 0 points 0 points 0 points 0 points 0 points  Months in reverse 4 points 0 points 0 points 0 points 0 points  Repeat phrase 0 points 0 points 4 points 4 points 0 points  Total Score 4 points 0 points 4 points 4 points 0 points    Immunizations Immunization History  Administered Date(s) Administered   Fluad Quad(high Dose 65+) 02/16/2019, 03/20/2020,  05/27/2022   Fluad Trivalent(High Dose 65+) 06/02/2023   Influenza, High Dose Seasonal PF 02/20/2015, 05/24/2016, 01/29/2017, 02/10/2018   Moderna SARS-COV2 Booster Vaccination 05/24/2021, 04/22/2022   Moderna Sars-Covid-2 Vaccination 07/05/2019, 08/02/2019, 04/01/2020, 10/10/2020   Pneumococcal Conjugate-13 01/10/2016   Pneumococcal Polysaccharide-23 11/18/2014   Td 01/29/2017   Tdap 04/10/2007   Tetanus 04/10/2007   Zoster Recombinant(Shingrix ) 11/29/2021    Screening Tests Health Maintenance  Topic Date Due   COVID-19 Vaccine (5 - 2024-25 season) 02/09/2023   Diabetic kidney evaluation - Urine ACR  09/27/2023   Zoster Vaccines- Shingrix  (2 of 2) 12/08/2023 (Originally 01/24/2022)   INFLUENZA VACCINE  01/09/2024   HEMOGLOBIN A1C  03/09/2024   Diabetic kidney evaluation - eGFR measurement  03/31/2024   MAMMOGRAM  05/26/2024   OPHTHALMOLOGY EXAM  08/27/2024   FOOT EXAM  09/07/2024   Medicare Annual Wellness (AWV)  12/04/2024   DTaP/Tdap/Td (4 - Td or Tdap) 01/30/2027  Colonoscopy  10/22/2033   Pneumococcal Vaccine: 50+ Years  Completed   DEXA SCAN  Completed   Hepatitis C Screening  Completed   Hepatitis B Vaccines  Aged Out   HPV VACCINES  Aged Out   Meningococcal B Vaccine  Aged Out    Health Maintenance  Health Maintenance Due  Topic Date Due   COVID-19 Vaccine (5 - 2024-25 season) 02/09/2023   Diabetic kidney evaluation - Urine ACR  09/27/2023   Health Maintenance Items Addressed:   Additional Screening:  Vision Screening: Recommended annual ophthalmology exams for early detection of glaucoma and other disorders of the eye. Would you like a referral to an eye doctor? No    Dental Screening: Recommended annual dental exams for proper oral hygiene  Community Resource Referral / Chronic Care Management: CRR required this visit?  No   CCM required this visit?  No   Plan:    I have personally reviewed and noted the following in the patient's chart:    Medical and social history Use of alcohol, tobacco or illicit drugs  Current medications and supplements including opioid prescriptions. Patient is not currently taking opioid prescriptions. Functional ability and status Nutritional status Physical activity Advanced directives List of other physicians Hospitalizations, surgeries, and ER visits in previous 12 months Vitals Screenings to include cognitive, depression, and falls Referrals and appointments  In addition, I have reviewed and discussed with patient certain preventive protocols, quality metrics, and best practice recommendations. A written personalized care plan for preventive services as well as general preventive health recommendations were provided to patient.   Lyle MARLA Right, [place]   12/05/2023   After Visit Summary: (MyChart) Due to this being a telephonic visit, the after visit summary with patients personalized plan was offered to patient via MyChart   Notes: Nothing significant to report at this time.

## 2023-12-05 NOTE — Patient Instructions (Signed)
 [name] , Thank you for taking time out of your busy schedule to complete your Annual Wellness Visit with me. I enjoyed our conversation and look forward to speaking with you again next year. I, as well as your care team,  appreciate your ongoing commitment to your health goals. Please review the following plan we discussed and let me know if I can assist you in the future. Your Game plan/ To Do List    Referrals: If you haven't heard from the office you've been referred to, please reach out to them at the phone provided.  none Follow up Visits: Next Medicare AWV with our clinical staff: 07/09/206   Have you seen your provider in the last 6 months (3 months if uncontrolled diabetes)? Yes /Next Office Visit with your provider: 01/07/2024  Clinician Recommendations:  Aim for 30 minutes of exercise or brisk walking, 6-8 glasses of water, and 5 servings of fruits and vegetables each day.       This is a list of the screening recommended for you and due dates:  Health Maintenance  Topic Date Due   COVID-19 Vaccine (5 - 2024-25 season) 02/09/2023   Yearly kidney health urinalysis for diabetes  09/27/2023   Zoster (Shingles) Vaccine (2 of 2) 12/08/2023*   Flu Shot  01/09/2024   Hemoglobin A1C  03/09/2024   Yearly kidney function blood test for diabetes  03/31/2024   Mammogram  05/26/2024   Eye exam for diabetics  08/27/2024   Complete foot exam   09/07/2024   Medicare Annual Wellness Visit  12/04/2024   DTaP/Tdap/Td vaccine (4 - Td or Tdap) 01/30/2027   Colon Cancer Screening  10/22/2033   Pneumococcal Vaccine for age over 67  Completed   DEXA scan (bone density measurement)  Completed   Hepatitis C Screening  Completed   Hepatitis B Vaccine  Aged Out   HPV Vaccine  Aged Out   Meningitis B Vaccine  Aged Out  *Topic was postponed. The date shown is not the original due date.    Advanced directives: (Copy Requested) Please bring a copy of your health care power of attorney and living  will to the office to be added to your chart at your convenience. You can mail to [facility] [address] [place], [place] [address] or email to [email or web address] Advance Care Planning is important because it:  [x]  Makes sure you receive the medical care that is consistent with your values, goals, and preferences  [x]  It provides guidance to your family and loved ones and reduces their decisional burden about whether or not they are making the right decisions based on your wishes.  Follow the link provided in your after visit summary or read over the paperwork we have mailed to you to help you started getting your Advance Directives in place. If you need assistance in completing these, please reach out to us  so that we can help you!  See attachments for Preventive Care and Fall Prevention Tips.

## 2023-12-06 ENCOUNTER — Encounter: Payer: Self-pay | Admitting: Emergency Medicine

## 2023-12-06 ENCOUNTER — Ambulatory Visit
Admission: EM | Admit: 2023-12-06 | Discharge: 2023-12-06 | Disposition: A | Discharge status: Home / Self Care | Attending: Family Medicine | Admitting: Family Medicine

## 2023-12-06 DIAGNOSIS — N3000 Acute cystitis without hematuria: Secondary | ICD-10-CM | POA: Insufficient documentation

## 2023-12-06 LAB — URINALYSIS, W/ REFLEX TO CULTURE (INFECTION SUSPECTED)
Bilirubin Urine: NEGATIVE
Glucose, UA: NEGATIVE mg/dL
Ketones, ur: NEGATIVE mg/dL
Nitrite: NEGATIVE
Protein, ur: NEGATIVE mg/dL
Specific Gravity, Urine: 1.02 (ref 1.005–1.030)
pH: 7 (ref 5.0–8.0)

## 2023-12-06 MED ORDER — CEFDINIR 300 MG PO CAPS: 300.0000 mg | ORAL_CAPSULE | Freq: Two times a day (BID) | ORAL | 0 refills | Status: DC

## 2023-12-06 NOTE — ED Triage Notes (Signed)
 Pt c/o lower abdominal pain, dysuria, lower back pain. Started intermittently about a month ago. She states she does have genital herpes and unsure if she starting to have a flare also, but feels like she has a UTI.

## 2023-12-06 NOTE — Discharge Instructions (Signed)
 Stop by the pharmacy to pick up your prescriptions.  Follow up with your primary care provider or return to the urgent care, if not improving.   I sent your urine for culture. Someone may call you to change antibiotics.

## 2023-12-06 NOTE — ED Provider Notes (Signed)
 [facility]    CSN: [id number] Arrival date & time: 12/06/23  1005      History   Chief Complaint Chief Complaint  Patient presents with   Dysuria   Abdominal Pain     HPI HPI Dawn Werner is a 73 y.o. female.    Dawn Werner presents for intermittent dysuria, lower abdominal pain and lower back pain for the past month. Has slight vaginal discharge. No fever, nausea, vomiting, rash or hematuria. No urinary odor.   Tried Tylenol   prior to arrival.  Has not had any antibiotics in last 30 days.   Denies known STI exposure.  Has history of herpes but not flare up in 10 years.   No LMP recorded. Patient has had a hysterectomy.         Past Medical History:  Diagnosis Date   Anemia    Anxiety    Arthritis    Asthma    Coronary artery disease    Depression    Diabetes mellitus without complication (HCC)    GERD (gastroesophageal reflux disease)    Hyperlipidemia    Hypertension    Insomnia    Myocardial infarction [facility])     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   Primary osteoarthritis of knee 01/24/2015   Asthma, mild intermittent 11/18/2014   Arteriosclerosis of coronary artery 11/18/2014   Insomnia, persistent 11/18/2014   Dyslipidemia 11/18/2014   Acid reflux 11/18/2014   Diabetes type 2, controlled (HCC) 11/18/2014   Genital herpes in women 11/18/2014   Hypertension, benign 11/18/2014   Asymptomatic postmenopausal status 11/18/2014    History of acute myocardial infarction 11/18/2014    Past Surgical History:  Procedure Laterality Date   ABDOMINAL HYSTERECTOMY     BREAST SURGERY     breast reduction   COLONOSCOPY N/A 10/23/2023   Procedure: COLONOSCOPY;  Surgeon: Therisa Bi, MD;  Location: [facility] ENDOSCOPY;  Service: Gastroenterology;  Laterality: N/A;   REDUCTION MAMMAPLASTY Bilateral 1995   TOTAL HIP ARTHROPLASTY Left 03/31/2023   Procedure: TOTAL HIP ARTHROPLASTY ANTERIOR APPROACH;  Surgeon: Lorelle Hussar, MD;  Location: [facility] ORS;  Service: Orthopedics;  Laterality: Left;   TUBAL LIGATION      OB History   No obstetric history on file.      Home Medications    Prior to Admission medications   Medication Sig Start Date End Date Taking? Authorizing Provider  acetaminophen  (TYLENOL ) 500 MG tablet Take 1,000 mg by mouth every 6 (six) hours as needed.   Yes [provider]  carvedilol  (COREG ) 3.125 MG tablet TAKE 1 TABLET(3.125 MG) BY MOUTH TWICE DAILY WITH A MEAL 11/20/23  Yes Sowles, Krichna, MD  cefdinir (OMNICEF) 300 MG capsule Take 1 capsule (300 mg total) by mouth 2 (two) times daily. 12/06/23  Yes Sherrilyn Nairn, DO  celecoxib  (CELEBREX ) 100 MG capsule TAKE 1 CAPSULE(100 MG) BY MOUTH TWICE DAILY 10/06/23  Yes Sowles, Krichna, MD  citalopram  (CELEXA ) 20 MG tablet Take 1 tablet (20 mg total) by mouth daily. 06/02/23  Yes Sowles, Krichna, MD  ezetimibe  (ZETIA ) 10 MG tablet Take 1 tablet (10 mg total) by mouth daily. 06/02/23  Yes Sowles, Krichna, MD  losartan  (COZAAR ) 25 MG tablet Take 1 tablet (25 mg total) by mouth daily. 01/30/23  Yes Sowles, Krichna, MD  metFORMIN  (GLUCOPHAGE -XR) 750 MG 24 hr tablet Take 2 tablets (1,500 mg total) by mouth daily with breakfast. 06/05/23  Yes Sowles, Krichna, MD  omeprazole  (PRILOSEC) 40 MG capsule TAKE 1 CAPSULE(40 MG) BY MOUTH DAILY 08/18/23  Yes Sowles, Krichna, MD  pregabalin  (LYRICA ) 50 MG capsule Take 1-2 capsules (50-100 mg total) by mouth 2 (two) times daily.  09/08/23  Yes Sowles, Krichna, MD  rosuvastatin  (CRESTOR ) 40 MG tablet Take 1 tablet (40 mg total) by mouth daily. 01/30/23  Yes Sowles, Krichna, MD  zolpidem  (AMBIEN ) 10 MG tablet TAKE 1 TABLET(10 MG) BY MOUTH AT BEDTIME 10/07/23  Yes Sowles, Krichna, MD  albuterol  (VENTOLIN  HFA) 108 (90 Base) MCG/ACT inhaler Inhale 2-4 puffs into the lungs every 6 (six) hours as needed. 05/31/22   Asaiah Scarber, DO  Blood Glucose Monitoring Suppl (ACCU-CHEK GUIDE ME) w/Device KIT USE AS DIRECTED 09/18/21   Glenard, Krichna, MD  docusate sodium  (COLACE) 100 MG capsule Take 1 capsule (100 mg total) by mouth 2 (two) times daily. 04/01/23   Charlene Debby BROCKS, PA-C    Family History Family History  Problem Relation Age of Onset   Hyperlipidemia Mother    Hypertension Mother    Stroke Mother    Dementia Father    Cancer Sister        breast   Breast cancer Sister 19   Asthma Brother    Depression Brother        bipolar   Heart disease Brother    Heart disease Brother     Social History Social History   Tobacco Use   Smoking status: Never   Smokeless tobacco: Never  Vaping Use   Vaping status: Never Used  Substance Use Topics   Alcohol use: No    Alcohol/week: 0.0 standard drinks of alcohol   Drug use: No     Allergies   Metformin  and related   Review of Systems Review of Systems: :negative unless otherwise stated in HPI.      Physical Exam Triage Vital Signs ED Triage Vitals  Encounter Vitals Group     BP 12/06/23 1021 120/74     Girls Systolic BP Percentile --      Girls Diastolic BP Percentile --      Boys Systolic BP Percentile --      Boys Diastolic BP Percentile --      Pulse Rate 12/06/23 1021 63     Resp 12/06/23 1021 16     Temp 12/06/23 1021 97.9 F (36.6 C)     Temp Source 12/06/23 1021 Oral     SpO2 12/06/23 1021 96 %     Weight 12/06/23 1019 159 lb 13.3 oz (72.5 kg)     Height 12/06/23 1019 5' 3 (1.6 m)     Head Circumference --      Peak Flow --      Pain  Score 12/06/23 1019 8     Pain Loc --      Pain Education --      Exclude from Growth Chart --    No data found.  Updated Vital Signs BP 120/74 (BP Location: Right Arm)   Pulse 63   Temp 97.9 F (36.6 C) (Oral)  Resp 16   Ht 5' 3 (1.6 m)   Wt 72.5 kg   SpO2 96%   BMI 28.31 kg/m   Visual Acuity Right Eye Distance:   Left Eye Distance:   Bilateral Distance:    Right Eye Near:   Left Eye Near:    Bilateral Near:     Physical Exam GEN: well appearing female in no acute distress  CVS: well perfused  RESP: speaking in full sentences without pause  ABD: soft, non-tender, non-distended, no palpable masses, bilateral CVA tenderness   SKIN: warm, dry    UC Treatments / Results  Labs (all labs ordered are listed, but only abnormal results are displayed) Labs Reviewed  URINALYSIS, W/ REFLEX TO CULTURE (INFECTION SUSPECTED) - Abnormal; Notable for the following components:      Result Value   Hgb urine dipstick TRACE (*)    Leukocytes,Ua LARGE (*)    Bacteria, UA MANY (*)    All other components within normal limits    EKG   Radiology No results found.  Procedures Procedures (including critical care time)  Medications Ordered in UC Medications - No data to display  Initial Impression / Assessment and Plan / UC Course  I have reviewed the triage vital signs and the nursing notes.  Pertinent labs & imaging results that were available during my care of the patient were reviewed by me and considered in my medical decision making (see chart for details).       Acute cystitis:  Patient is a 73 y.o. female  who presents for intermittent dysuria and abdominal with lower back pain for the past month. Overall patient is well-appearing and afebrile.  Vital signs stable.  Urinalysis consistent with acute cystitis.  Hematuria not supported on microscopy.  Treat with Cefdinir 2 times daily for 7 days.  Urine culture requested but unable to collect and pt not able to  urinate again. Return precautions including abdominal pain, fever, chills, nausea, or vomiting given. Follow-up,  if symptoms not improving or getting worse. Discussed MDM, treatment plan and plan for follow-up with patient who agrees with plan.        Final Clinical Impressions(s) / UC Diagnoses   Final diagnoses:  Acute cystitis without hematuria     Discharge Instructions      Stop by the pharmacy to pick up your prescriptions.  Follow up with your primary care provider or return to the urgent care, if not improving.   I sent your urine for culture.  Someone may call you to change antibiotics.      ED Prescriptions     Medication Sig Dispense Auth. Provider   cefdinir (OMNICEF) 300 MG capsule Take 1 capsule (300 mg total) by mouth 2 (two) times daily. 14 capsule Dawn Hamblin, DO      PDMP not reviewed this encounter.   Dawn Revels, DO 12/06/23 1053

## 2023-12-15 ENCOUNTER — Ambulatory Visit (INDEPENDENT_AMBULATORY_CARE_PROVIDER_SITE_OTHER): Admitting: Family Medicine

## 2023-12-15 ENCOUNTER — Encounter: Payer: Self-pay | Admitting: Family Medicine

## 2023-12-15 ENCOUNTER — Ambulatory Visit: Payer: Self-pay | Admitting: *Deleted

## 2023-12-15 VITALS — BP 110/70 | HR 71 | Resp 16 | Ht 63.0 in | Wt 163.2 lb

## 2023-12-15 DIAGNOSIS — M545 Low back pain, unspecified: Secondary | ICD-10-CM

## 2023-12-15 DIAGNOSIS — R350 Frequency of micturition: Secondary | ICD-10-CM | POA: Diagnosis not present

## 2023-12-15 LAB — POCT URINALYSIS DIPSTICK
Bilirubin, UA: NEGATIVE
Glucose, UA: NEGATIVE
Ketones, UA: NEGATIVE
Nitrite, UA: NEGATIVE
Protein, UA: NEGATIVE
Spec Grav, UA: 1.02 (ref 1.010–1.025)
Urobilinogen, UA: 0.2 U/dL
pH, UA: 5 (ref 5.0–8.0)

## 2023-12-15 NOTE — Telephone Encounter (Signed)
 FYI Only or Action Required?: FYI only for provider.  Patient was last seen in primary care on 09/08/2023 by Glenard Mire, MD. Called Nurse Triage reporting urinary pain. Symptoms began a week ago. Interventions attempted: Prescription medications: Cefdinir  . Symptoms are: gradually worsening.  Triage Disposition: See Physician Within 24 Hours  Patient/caregiver understands and will follow disposition?: Yes   Reason for Disposition  [1] Taking antibiotic > 72 hours (3 days) for UTI AND [2] flank or lower back pain is SAME (unchanged, not better)  Answer Assessment - Initial Assessment Questions 1. MAIN SYMPTOM: What is the main symptom you are concerned about? (e.g., painful urination, urine frequency)     Lower back pain, urgency,frequency 2. BETTER-SAME-WORSE: Are you getting better, staying the same, or getting worse compared to how you felt at your last visit to the doctor (most recent medical visit)?     Did get better 3. PAIN: How bad is the pain?  (e.g., Scale 1-10; mild, moderate, or severe)   - MILD (1-3): complains slightly about urination hurting   - MODERATE (4-7): interferes with normal activities     - SEVERE (8-10): excruciating, unwilling or unable to urinate because of the pain      moderate 4. FEVER: Do you have a fever? If Yes, ask: What is it, how was it measured, and when did it start?     no 5. OTHER SYMPTOMS: Do you have any other symptoms? (e.g., blood in the urine, flank pain, vaginal discharge)     Frequency, urgency 6. DIAGNOSIS: When was the UTI diagnosed? By whom? Was it a kidney infection, bladder infection or both?     Saturday - 6/28, [facility] [place] 7. ANTIBIOTIC: What antibiotic(s) are you taking? How many times per day?     cefdinir  8. ANTIBIOTIC - START DATE: When did you start taking the antibiotic?     6/28- 7 days  Protocols used: Urinary Tract Infection on Antibiotic Follow-up Call - [facility]    Copied from CRM  [id number]. Topic: Clinical - Red Word Triage >> Dec 15, 2023  8:33 AM Kevelyn M wrote: Red Word that prompted transfer to Nurse Triage: A week ago Saturday had a Uti and went to the walk-in, and was prescribed anti-biotics. Patient is experiencing awful lower back pain, sense of urgency and nothing comes out, and lower stomach pain.

## 2023-12-15 NOTE — Progress Notes (Signed)
 Name: Dawn Werner   MRN: [id number]    DOB: 07/05/1950   Date:12/15/2023       Progress Note  Subjective  Chief Complaint  Chief Complaint  Patient presents with   Urinary Frequency   Back Pain    Lower R back on going for a month, seen at walk in clinic in [place] about over a week    Discussed the use of AI scribe software for clinical note transcription with the patient, who gave verbal consent to proceed.  History of Present Illness Dawn Werner is a 73 year old female with diabetes who presents with lower back and abdominal pain, urinary urgency, and constipation.  She has been experiencing persistent lower back and lower abdominal pain for over a month, severe enough to disrupt her sleep. The pain does not radiate down her legs. She initially sought care at [facility] urgent care on December 06, 2023, for these symptoms.  She reports urinary urgency, sometimes unable to reach the bathroom in time, and despite adequate fluid intake, she can go all day without urinating. She was prescribed cefdinir , which provided temporary relief, but the pain returned. No dysuria, hematuria, fever, or chills. She also describes a sensation of heaviness in the vaginal area, referring to it as 'a little bladder dropping.'  She has been experiencing constipation since the onset of her other symptoms. She takes stool softeners regularly but has noticed decreased bowel movement frequency since her normal colonoscopy on Oct 23, 2023.  Her diabetes is managed with metformin , and her last A1c in March was 7.2, improved from 7.7 in October. She has not had any recent lab work since March.    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   Primary osteoarthritis of knee 01/24/2015   Asthma, mild intermittent 11/18/2014   Arteriosclerosis of coronary artery 11/18/2014   Insomnia, persistent 11/18/2014   Dyslipidemia 11/18/2014   Acid reflux 11/18/2014   Diabetes type 2, controlled (HCC) 11/18/2014   Genital herpes in women 11/18/2014   Hypertension, benign 11/18/2014   Asymptomatic postmenopausal status 11/18/2014   History of acute myocardial infarction 11/18/2014    Social History   Tobacco Use   Smoking status: Never   Smokeless tobacco: Never  Substance Use Topics   Alcohol use: No    Alcohol/week: 0.0 standard drinks of alcohol     Current Outpatient Medications:    acetaminophen  (TYLENOL ) 500 MG tablet, Take 1,000 mg by mouth every 6 (six) hours as needed., Disp: , Rfl:    albuterol  (VENTOLIN  HFA) 108 (90 Base) MCG/ACT inhaler, Inhale 2-4 puffs into the lungs every 6 (six) hours as needed., Disp: 8 g, Rfl: 0   Blood Glucose Monitoring Suppl (ACCU-CHEK GUIDE ME) w/Device KIT, USE AS DIRECTED, Disp: 1 kit, Rfl: 0   carvedilol  (COREG ) 3.125 MG tablet, TAKE 1 TABLET(3.125 MG) BY MOUTH TWICE DAILY WITH A MEAL, Disp: 180 tablet, Rfl: 0   celecoxib  (CELEBREX ) 100 MG capsule, TAKE 1 CAPSULE(100 MG) BY MOUTH TWICE DAILY, Disp: 60 capsule,  Rfl: 0   citalopram  (CELEXA ) 20 MG tablet, Take 1 tablet (20 mg total) by mouth daily., Disp: 90 tablet, Rfl: 1   docusate sodium  (COLACE) 100 MG capsule, Take 1 capsule (100 mg total) by mouth 2 (two) times daily., Disp: , Rfl:    ezetimibe  (ZETIA ) 10 MG tablet, Take 1 tablet (10 mg total) by mouth daily., Disp: 90 tablet, Rfl: 1   losartan  (COZAAR ) 25 MG tablet, Take 1 tablet (25 mg total) by mouth daily., Disp: 90 tablet, Rfl: 3   metFORMIN  (GLUCOPHAGE -XR)  750 MG 24 hr tablet, Take 2 tablets (1,500 mg total) by mouth daily with breakfast., Disp: 180 tablet, Rfl: 1   omeprazole  (PRILOSEC) 40 MG capsule, TAKE 1 CAPSULE(40 MG) BY MOUTH DAILY, Disp: 90 capsule, Rfl: 3   pregabalin  (LYRICA ) 50 MG capsule, Take 1-2 capsules (50-100 mg total) by mouth 2 (two) times daily., Disp: 120 capsule, Rfl: 0   rosuvastatin  (CRESTOR ) 40 MG tablet, Take 1 tablet (40 mg total) by mouth daily., Disp: 90 tablet, Rfl: 3   zolpidem  (AMBIEN ) 10 MG tablet, TAKE 1 TABLET(10 MG) BY MOUTH AT BEDTIME, Disp: 90 tablet, Rfl: 0   cefdinir  (OMNICEF ) 300 MG capsule, Take 1 capsule (300 mg total) by mouth 2 (two) times daily. (Patient not taking: Reported on 12/15/2023), Disp: 14 capsule, Rfl: 0  Allergies  Allergen Reactions   Metformin  And Related Diarrhea    And nausea. Tolerates XR formulation    ROS  Ten systems reviewed and is negative except as mentioned in HPI    Objective  Vitals:   12/15/23 1338  BP: 110/70  Pulse: 71  Resp: 16  SpO2: 96%  Weight: 163 lb 3.2 oz (74 kg)  Height: 5' 3 (1.6 m)    Body mass index is 28.91 kg/m.    Physical Exam CONSTITUTIONAL: Patient appears well-developed and well-nourished. No distress. HEENT: Head atraumatic, normocephalic, neck supple. CARDIOVASCULAR: Normal rate, regular rhythm and normal heart sounds. No murmur heard. No BLE edema. PULMONARY: Effort normal and breath sounds normal. No respiratory distress. Lungs clear to auscultation bilaterally. PELVIC exam: not done, patient prefers holding off for now  ABDOMINAL: There is no tenderness or distention. No costovertebral angle tenderness. MUSCULOSKELETAL: Normal gait. Without gross motor or sensory deficit. PSYCHIATRIC: Patient has a normal mood and affect. Behavior is normal. Judgment and thought content normal.  Recent Results (from the past 2160 hours)  Glucose, capillary     Status: Abnormal   Collection Time: 10/23/23  9:10 AM  Result Value Ref Range    Glucose-Capillary 121 (H) 70 - 99 mg/dL    Comment: Glucose reference range applies only to samples taken after fasting for at least 8 hours.  Urinalysis, w/ Reflex to Culture (Infection Suspected) -Urine, Clean Catch     Status: Abnormal   Collection Time: 12/06/23 10:23 AM  Result Value Ref Range   Specimen Source URINE, CLEAN CATCH    Color, Urine YELLOW YELLOW   APPearance CLEAR CLEAR   Specific Gravity, Urine 1.020 1.005 - 1.030   pH 7.0 5.0 - 8.0   Glucose, UA NEGATIVE NEGATIVE mg/dL   Hgb urine dipstick TRACE (A) NEGATIVE   Bilirubin Urine NEGATIVE NEGATIVE   Ketones, ur NEGATIVE NEGATIVE mg/dL   Protein, ur NEGATIVE NEGATIVE mg/dL   Nitrite NEGATIVE NEGATIVE   Leukocytes,Ua LARGE (A) NEGATIVE   Squamous Epithelial / HPF 0-5 0 - 5 /HPF   WBC, UA 6-10 0 - 5 WBC/hpf    Comment: Reflex urine  culture not performed if WBC <=10, OR if Squamous epithelial cells >5. If Squamous epithelial cells >5, suggest recollection.   RBC / HPF 0-5 0 - 5 RBC/hpf   Bacteria, UA MANY (A) NONE SEEN    Comment: Performed at [facility], [address] [place], [place] [address]  POCT urinalysis dipstick     Status: Abnormal   Collection Time: 12/15/23  1:41 PM  Result Value Ref Range   Color, UA Yellow    Clarity, UA cloudy    Glucose, UA Negative Negative   Bilirubin, UA Negative    Ketones, UA Negative    Spec Grav, UA 1.020 1.010 - 1.025   Blood, UA Moderate    pH, UA 5.0 5.0 - 8.0   Protein, UA Negative Negative   Urobilinogen, UA 0.2 0.2 or 1.0 E.U./dL   Nitrite, UA Negative    Leukocytes, UA Moderate (2+) (A) Negative   Appearance yellow    Odor foul       Assessment & Plan Urinary frequency and urgency with lower back and abdominal pain Urinary symptoms with lower back and abdominal pain for one month. Previous cefdinir  treatment ineffective. Differential includes UTI versus other causes like bladder prolapse. Constipation may contribute. - Send urine for  culture. - Initiate treatment if culture positive. - Investigate other causes if culture negative.  Constipation Constipation may contribute to urinary symptoms due to bladder pressure. Stool softeners used but bowel movement frequency decreased. - Ensure regular use of stool softeners for daily bowel movements.  History of hysterectomy No current issues related to hysterectomy history.  History of low back pain with radiculitis, currently resolved Resolved post-hip surgery. No current issues.

## 2023-12-16 ENCOUNTER — Ambulatory Visit: Payer: Self-pay | Admitting: Family Medicine

## 2023-12-16 LAB — COMPLETE METABOLIC PANEL WITHOUT GFR
AG Ratio: 2 (calc) (ref 1.0–2.5)
ALT: 32 U/L — ABNORMAL HIGH (ref 6–29)
AST: 37 U/L — ABNORMAL HIGH (ref 10–35)
Albumin: 4.7 g/dL (ref 3.6–5.1)
Alkaline phosphatase (APISO): 73 U/L (ref 37–153)
BUN: 9 mg/dL (ref 7–25)
CO2: 28 mmol/L (ref 20–32)
Calcium: 9.6 mg/dL (ref 8.6–10.4)
Chloride: 102 mmol/L (ref 98–110)
Creat: 0.62 mg/dL (ref 0.60–1.00)
Globulin: 2.4 g/dL (ref 1.9–3.7)
Glucose, Bld: 104 mg/dL — ABNORMAL HIGH (ref 65–99)
Potassium: 4.3 mmol/L (ref 3.5–5.3)
Sodium: 139 mmol/L (ref 135–146)
Total Bilirubin: 0.4 mg/dL (ref 0.2–1.2)
Total Protein: 7.1 g/dL (ref 6.1–8.1)

## 2023-12-16 LAB — CBC WITH DIFFERENTIAL/PLATELET
Absolute Lymphocytes: 2338 {cells}/uL (ref 850–3900)
Absolute Monocytes: 509 {cells}/uL (ref 200–950)
Basophils Absolute: 47 {cells}/uL (ref 0–200)
Basophils Relative: 0.7 %
Eosinophils Absolute: 121 {cells}/uL (ref 15–500)
Eosinophils Relative: 1.8 %
HCT: 39.2 % (ref 35.0–45.0)
Hemoglobin: 12.5 g/dL (ref 11.7–15.5)
MCH: 28 pg (ref 27.0–33.0)
MCHC: 31.9 g/dL — ABNORMAL LOW (ref 32.0–36.0)
MCV: 87.9 fL (ref 80.0–100.0)
MPV: 10.8 fL (ref 7.5–12.5)
Monocytes Relative: 7.6 %
Neutro Abs: 3685 {cells}/uL (ref 1500–7800)
Neutrophils Relative %: 55 %
Platelets: 279 Thousand/uL (ref 140–400)
RBC: 4.46 Million/uL (ref 3.80–5.10)
RDW: 11.9 % (ref 11.0–15.0)
Total Lymphocyte: 34.9 %
WBC: 6.7 Thousand/uL (ref 3.8–10.8)

## 2023-12-16 LAB — IRON,TIBC AND FERRITIN PANEL
%SAT: 34 % (ref 16–45)
Ferritin: 35 ng/mL (ref 16–288)
Iron: 114 ug/dL (ref 45–160)
TIBC: 333 ug/dL (ref 250–450)

## 2023-12-16 LAB — URINE CULTURE
MICRO NUMBER:: 16666486
SPECIMEN QUALITY:: ADEQUATE

## 2023-12-16 LAB — LIPID PANEL
Cholesterol: 180 mg/dL (ref <200)
HDL: 49 mg/dL — ABNORMAL LOW (ref >=50)
LDL Cholesterol (Calc): 106 mg/dL — ABNORMAL HIGH
Non-HDL Cholesterol (Calc): 131 mg/dL — ABNORMAL HIGH (ref <130)
Total CHOL/HDL Ratio: 3.7 (calc) (ref <5.0)
Triglycerides: 133 mg/dL (ref <150)

## 2023-12-16 LAB — MICROALBUMIN / CREATININE URINE RATIO
Creatinine, Urine: 118 mg/dL (ref 20–275)
Microalb Creat Ratio: 8 mg/g{creat} (ref <30)
Microalb, Ur: 0.9 mg/dL

## 2023-12-16 LAB — B12 AND FOLATE PANEL
Folate: 19.4 ng/mL
Vitamin B-12: 395 pg/mL (ref 200–1100)

## 2023-12-16 LAB — VITAMIN D 25 HYDROXY (VIT D DEFICIENCY, FRACTURES): Vit D, 25-Hydroxy: 39 ng/mL (ref 30–100)

## 2023-12-17 ENCOUNTER — Ambulatory Visit: Payer: Self-pay | Admitting: Family Medicine

## 2023-12-25 ENCOUNTER — Encounter: Payer: Self-pay | Admitting: Family Medicine

## 2023-12-25 ENCOUNTER — Ambulatory Visit (INDEPENDENT_AMBULATORY_CARE_PROVIDER_SITE_OTHER): Admitting: Family Medicine

## 2023-12-25 ENCOUNTER — Ambulatory Visit: Payer: Self-pay

## 2023-12-25 ENCOUNTER — Other Ambulatory Visit (HOSPITAL_COMMUNITY)
Admission: RE | Admit: 2023-12-25 | Discharge: 2023-12-25 | Disposition: A | Discharge status: Home / Self Care | Source: Ambulatory Visit | Attending: Family Medicine | Admitting: Family Medicine

## 2023-12-25 VITALS — BP 118/70 | HR 91 | Resp 16 | Ht 63.0 in | Wt 159.7 lb

## 2023-12-25 DIAGNOSIS — N898 Other specified noninflammatory disorders of vagina: Secondary | ICD-10-CM | POA: Diagnosis not present

## 2023-12-25 DIAGNOSIS — F330 Major depressive disorder, recurrent, mild: Secondary | ICD-10-CM

## 2023-12-25 DIAGNOSIS — N8110 Cystocele, unspecified: Secondary | ICD-10-CM

## 2023-12-25 DIAGNOSIS — R102 Pelvic and perineal pain: Secondary | ICD-10-CM

## 2023-12-25 DIAGNOSIS — K5900 Constipation, unspecified: Secondary | ICD-10-CM

## 2023-12-25 LAB — POCT URINALYSIS DIPSTICK
Glucose, UA: NEGATIVE
Ketones, UA: NEGATIVE
Nitrite, UA: NEGATIVE
Protein, UA: NEGATIVE
Spec Grav, UA: 1.025 (ref 1.010–1.025)
Urobilinogen, UA: 0.2 U/dL
pH, UA: 6 (ref 5.0–8.0)

## 2023-12-25 MED ORDER — BUPROPION HCL ER (XL) 150 MG PO TB24: 150.0000 mg | ORAL_TABLET | ORAL | 0 refills | Status: DC

## 2023-12-25 NOTE — Progress Notes (Signed)
 Name: Dawn Werner   MRN: [id number]    DOB: May 01, 1951   Date:12/25/2023       Progress Note  Subjective  Chief Complaint  Chief Complaint  Patient presents with   Vaginal Pain    Sx on going for a month, wants to be examined,    Dysuria   Vaginal Discharge    Clear sticky   Discussed the use of AI scribe software for clinical note transcription with the patient, who gave verbal consent to proceed.  History of Present Illness Dawn  A Werner is a 73 year old female who presents with pelvic discomfort and constipation.  She has been experiencing pelvic discomfort for about a month, describing it as a constant pressure rather than sharp or achy. This discomfort is present daily and is accompanied by a new, slight, smelly vaginal discharge. No lesions or burning during urination. She has a history of herpes but does not recall having lesions or burning during previous episodes.  Constipation began around the same time as the pelvic discomfort. She previously managed with a regular stool softener but now has difficulty with bowel movements. Her last good bowel movement was at the beginning of the week. She denies diarrhea and uses Miralax, which is effective but not used daily.  No fever, chills, blood in the urine, or burning during urination. She experiences urinary frequency and now instead of low back pain she has pelvic pressure A colonoscopy on Oct 23, 2023, was normal.  She notes a decrease in dietary fiber intake, possibly due to feeling unwell and experiencing some depression. Her depression is influenced by her husband's early signs of dementia and associated stress. She is currently taking Celexa  for depression and Ambien  for sleep.  Her husband is in a program where someone assists with his medication management at home, but she does not have additional help. She finds the situation emotionally draining, as her husband is often argumentative and difficult. Discussed  adjusting medication for depression and she is willing to try it     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   Primary osteoarthritis of knee 01/24/2015   Asthma, mild intermittent 11/18/2014   Arteriosclerosis of coronary artery 11/18/2014   Insomnia, persistent 11/18/2014   Dyslipidemia 11/18/2014   Acid reflux 11/18/2014   Diabetes type 2, controlled (HCC) 11/18/2014   Genital herpes in women 11/18/2014   Hypertension, benign 11/18/2014   Asymptomatic postmenopausal status 11/18/2014   History of acute myocardial infarction 11/18/2014    Past Surgical History:  Procedure Laterality Date   ABDOMINAL HYSTERECTOMY     BREAST SURGERY     breast reduction   COLONOSCOPY N/A 10/23/2023   Procedure: COLONOSCOPY;  Surgeon: Therisa Bi, MD;  Location: [facility] ENDOSCOPY;  Service: Gastroenterology;  Laterality: N/A;   REDUCTION MAMMAPLASTY Bilateral 1995   TOTAL HIP ARTHROPLASTY Left 03/31/2023   Procedure: TOTAL HIP ARTHROPLASTY ANTERIOR APPROACH;  Surgeon: Lorelle Hussar, MD;  Location: [facility] ORS;  Service: Orthopedics;  Laterality: Left;   TUBAL LIGATION  Family History  Problem Relation Age of Onset   Hyperlipidemia Mother    Hypertension Mother    Stroke Mother    Dementia Father    Cancer Sister        breast   Breast cancer Sister 74   Asthma Brother    Depression Brother        bipolar   Heart disease Brother    Heart disease Brother     Social History   Tobacco Use    Smoking status: Never   Smokeless tobacco: Never  Substance Use Topics   Alcohol use: No    Alcohol/week: 0.0 standard drinks of alcohol     Current Outpatient Medications:    acetaminophen  (TYLENOL ) 500 MG tablet, Take 1,000 mg by mouth every 6 (six) hours as needed., Disp: , Rfl:    albuterol  (VENTOLIN  HFA) 108 (90 Base) MCG/ACT inhaler, Inhale 2-4 puffs into the lungs every 6 (six) hours as needed., Disp: 8 g, Rfl: 0   Blood Glucose Monitoring Suppl (ACCU-CHEK GUIDE ME) w/Device KIT, USE AS DIRECTED, Disp: 1 kit, Rfl: 0   buPROPion  (WELLBUTRIN  XL) 150 MG 24 hr tablet, Take 1 tablet (150 mg total) by mouth every morning., Disp: 90 tablet, Rfl: 0   carvedilol  (COREG ) 3.125 MG tablet, TAKE 1 TABLET(3.125 MG) BY MOUTH TWICE DAILY WITH A MEAL, Disp: 180 tablet, Rfl: 0   celecoxib  (CELEBREX ) 100 MG capsule, TAKE 1 CAPSULE(100 MG) BY MOUTH TWICE DAILY, Disp: 60 capsule, Rfl: 0   citalopram  (CELEXA ) 20 MG tablet, Take 1 tablet (20 mg total) by mouth daily., Disp: 90 tablet, Rfl: 1   docusate sodium  (COLACE) 100 MG capsule, Take 1 capsule (100 mg total) by mouth 2 (two) times daily., Disp: , Rfl:    ezetimibe  (ZETIA ) 10 MG tablet, Take 1 tablet (10 mg total) by mouth daily., Disp: 90 tablet, Rfl: 1   losartan  (COZAAR ) 25 MG tablet, Take 1 tablet (25 mg total) by mouth daily., Disp: 90 tablet, Rfl: 3   metFORMIN  (GLUCOPHAGE -XR) 750 MG 24 hr tablet, Take 2 tablets (1,500 mg total) by mouth daily with breakfast., Disp: 180 tablet, Rfl: 1   omeprazole  (PRILOSEC) 40 MG capsule, TAKE 1 CAPSULE(40 MG) BY MOUTH DAILY, Disp: 90 capsule, Rfl: 3   pregabalin  (LYRICA ) 50 MG capsule, Take 1-2 capsules (50-100 mg total) by mouth 2 (two) times daily., Disp: 120 capsule, Rfl: 0   rosuvastatin  (CRESTOR ) 40 MG tablet, Take 1 tablet (40 mg total) by mouth daily., Disp: 90 tablet, Rfl: 3   zolpidem  (AMBIEN ) 10 MG tablet, TAKE 1 TABLET(10 MG) BY MOUTH AT BEDTIME, Disp: 90 tablet, Rfl: 0  Allergies  Allergen Reactions    Metformin  And Related Diarrhea    And nausea. Tolerates XR formulation    I personally reviewed active problem list, medication list, allergies, family history with the patient/caregiver today.   ROS  Ten systems reviewed and is negative except as mentioned in HPI    Objective Physical Exam CONSTITUTIONAL: Patient appears well-developed and well-nourished. No distress. HEENT: Head atraumatic, normocephalic, neck supple. CARDIOVASCULAR: Normal rate, regular rhythm and normal heart sounds. No murmur heard. No BLE edema. PULMONARY: Effort normal and breath sounds normal. No respiratory distress. ABDOMINAL: some lower abdominal pain PSYCHIATRIC: Patient has a normal mood and affect. Behavior is normal. Judgment and thought content normal. GENITOURINARY: No inguinal lymphadenopathy. External vagina without lesions. Minimal vaginal discharge and also has vaginal atrophy. She has cystocele RECTAL: Rectal exam normal.  Vitals:   12/25/23  1049  BP: 118/70  Pulse: 91  Resp: 16  SpO2: 97%  Weight: 159 lb 11.2 oz (72.4 kg)  Height: 5' 3 (1.6 m)    Body mass index is 28.29 kg/m.  Recent Results (from the past 2160 hours)  Glucose, capillary     Status: Abnormal   Collection Time: 10/23/23  9:10 AM  Result Value Ref Range   Glucose-Capillary 121 (H) 70 - 99 mg/dL    Comment: Glucose reference range applies only to samples taken after fasting for at least 8 hours.  Urinalysis, w/ Reflex to Culture (Infection Suspected) -Urine, Clean Catch     Status: Abnormal   Collection Time: 12/06/23 10:23 AM  Result Value Ref Range   Specimen Source URINE, CLEAN CATCH    Color, Urine YELLOW YELLOW   APPearance CLEAR CLEAR   Specific Gravity, Urine 1.020 1.005 - 1.030   pH 7.0 5.0 - 8.0   Glucose, UA NEGATIVE NEGATIVE mg/dL   Hgb urine dipstick TRACE (A) NEGATIVE   Bilirubin Urine NEGATIVE NEGATIVE   Ketones, ur NEGATIVE NEGATIVE mg/dL   Protein, ur NEGATIVE NEGATIVE mg/dL   Nitrite  NEGATIVE NEGATIVE   Leukocytes,Ua LARGE (A) NEGATIVE   Squamous Epithelial / HPF 0-5 0 - 5 /HPF   WBC, UA 6-10 0 - 5 WBC/hpf    Comment: Reflex urine culture not performed if WBC <=10, OR if Squamous epithelial cells >5. If Squamous epithelial cells >5, suggest recollection.   RBC / HPF 0-5 0 - 5 RBC/hpf   Bacteria, UA MANY (A) NONE SEEN    Comment: Performed at [facility], [address], [place], [place] [address]  POCT urinalysis dipstick     Status: Abnormal   Collection Time: 12/15/23  1:41 PM  Result Value Ref Range   Color, UA Yellow    Clarity, UA cloudy    Glucose, UA Negative Negative   Bilirubin, UA Negative    Ketones, UA Negative    Spec Grav, UA 1.020 1.010 - 1.025   Blood, UA Moderate    pH, UA 5.0 5.0 - 8.0   Protein, UA Negative Negative   Urobilinogen, UA 0.2 0.2 or 1.0 E.U./dL   Nitrite, UA Negative    Leukocytes, UA Moderate (2+) (A) Negative   Appearance yellow    Odor foul   Microalbumin / creatinine urine ratio     Status: None   Collection Time: 12/15/23  2:12 PM  Result Value Ref Range   Creatinine, Urine 118 20 - 275 mg/dL   Microalb, Ur 0.9 mg/dL    Comment: Reference Range Not established    Microalb Creat Ratio 8 <30 mg/g creat    Comment: . The ADA defines abnormalities in albumin excretion as follows: SABRA Albuminuria Category        Result (mg/g creatinine) . Normal to Mildly increased   <30 Moderately increased         30-299  Severely increased           > OR = 300 . The ADA recommends that at least two of three specimens collected within a 3-6 month period be abnormal before considering a patient to be within a diagnostic category.   Lipid panel     Status: Abnormal   Collection Time: 12/15/23  2:12 PM  Result Value Ref Range   Cholesterol 180 <200 mg/dL   HDL 49 (L) > OR = 50 mg/dL   Triglycerides 866 <849 mg/dL   LDL Cholesterol (Calc)  106 (H) mg/dL (calc)    Comment: Reference range: <100 . Desirable range  <100 mg/dL for primary prevention;   <70 mg/dL for patients with CHD or diabetic patients  with > or = 2 CHD risk factors. SABRA LDL-C is now calculated using the Martin-Hopkins  calculation, which is a validated novel method providing  better accuracy than the Friedewald equation in the  estimation of LDL-C.  Gladis APPLETHWAITE et al. SANDREA. 7986;689(80): 2061-2068  (http://education.[facility].com/faq/FAQ164)    Total CHOL/HDL Ratio 3.7 <5.0 (calc)   Non-HDL Cholesterol (Calc) 131 (H) <130 mg/dL (calc)    Comment: For patients with diabetes plus 1 major ASCVD risk  factor, treating to a non-HDL-C goal of <100 mg/dL  (LDL-C of <29 mg/dL) is considered a therapeutic  option.   CBC with Differential/Platelet     Status: Abnormal   Collection Time: 12/15/23  2:12 PM  Result Value Ref Range   WBC 6.7 3.8 - 10.8 Thousand/uL   RBC 4.46 3.80 - 5.10 Million/uL   Hemoglobin 12.5 11.7 - 15.5 g/dL   HCT 60.7 64.9 - 54.9 %   MCV 87.9 80.0 - 100.0 fL   MCH 28.0 27.0 - 33.0 pg   MCHC 31.9 (L) 32.0 - 36.0 g/dL    Comment: For adults, a slight decrease in the calculated MCHC value (in the range of 30 to 32 g/dL) is most likely not clinically significant; however, it should be interpreted with caution in correlation with other red cell parameters and the patient's clinical condition.    RDW 11.9 11.0 - 15.0 %   Platelets 279 140 - 400 Thousand/uL   MPV 10.8 7.5 - 12.5 fL   Neutro Abs 3,685 1,500 - 7,800 cells/uL   Absolute Lymphocytes 2,338 850 - 3,900 cells/uL   Absolute Monocytes 509 200 - 950 cells/uL   Eosinophils Absolute 121 15 - 500 cells/uL   Basophils Absolute 47 0 - 200 cells/uL   Neutrophils Relative % 55 %   Total Lymphocyte 34.9 %   Monocytes Relative 7.6 %   Eosinophils Relative 1.8 %   Basophils Relative 0.7 %  COMPLETE METABOLIC PANEL WITHOUT GFR     Status: Abnormal   Collection Time: 12/15/23  2:12 PM  Result Value Ref Range   Glucose, Bld 104 (H) 65 - 99 mg/dL     Comment: .            Fasting reference interval . For someone without known diabetes, a glucose value between 100 and 125 mg/dL is consistent with prediabetes and should be confirmed with a follow-up test. .    BUN 9 7 - 25 mg/dL   Creat 9.37 9.39 - 8.99 mg/dL   BUN/Creatinine Ratio SEE NOTE: 6 - 22 (calc)    Comment:    Not Reported: BUN and Creatinine are within    reference range. .    Sodium 139 135 - 146 mmol/L   Potassium 4.3 3.5 - 5.3 mmol/L   Chloride 102 98 - 110 mmol/L   CO2 28 20 - 32 mmol/L   Calcium  9.6 8.6 - 10.4 mg/dL   Total Protein 7.1 6.1 - 8.1 g/dL   Albumin 4.7 3.6 - 5.1 g/dL   Globulin 2.4 1.9 - 3.7 g/dL (calc)   AG Ratio 2.0 1.0 - 2.5 (calc)   Total Bilirubin 0.4 0.2 - 1.2 mg/dL   Alkaline phosphatase (APISO) 73 37 - 153 U/L   AST 37 (H) 10 - 35 U/L   ALT 32 (H)  6 - 29 U/L  B12 and Folate Panel     Status: None   Collection Time: 12/15/23  2:12 PM  Result Value Ref Range   Vitamin B-12 395 200 - 1,100 pg/mL    Comment: . Please Note: Although the reference range for vitamin B12 is [phone] pg/mL, it has been reported that between 5 and 10% of patients with values between 200 and 400 pg/mL may experience neuropsychiatric and hematologic abnormalities due to occult B12 deficiency; less than 1% of patients with values above 400 pg/mL will have symptoms. .    Folate 19.4 ng/mL    Comment:                            Reference Range                            Low:           <3.4                            Borderline:    3.4-5.4                            Normal:        >5.4 .   VITAMIN D  25 Hydroxy (Vit-D Deficiency, Fractures)     Status: None   Collection Time: 12/15/23  2:12 PM  Result Value Ref Range   Vit D, 25-Hydroxy 39 30 - 100 ng/mL    Comment: Vitamin D  Status         25-OH Vitamin D : . Deficiency:                    <20 ng/mL Insufficiency:             20 - 29 ng/mL Optimal:                 > or = 30 ng/mL . For 25-OH Vitamin D   testing on patients on  D2-supplementation and patients for whom quantitation  of D2 and D3 fractions is required, the [facility](TM) 25-OH VIT D, (D2,D3), LC/MS/MS is recommended: order  code 07111 (patients >66yrs). . See Note 1 . Note 1 . For additional information, please refer to  http://education.[facility].com/faq/FAQ199  (This link is being provided for informational/ educational purposes only.)   Iron, TIBC and Ferritin Panel     Status: None   Collection Time: 12/15/23  2:12 PM  Result Value Ref Range   Iron 114 45 - 160 mcg/dL   TIBC 666 749 - 549 mcg/dL (calc)   %SAT 34 16 - 45 % (calc)   Ferritin 35 16 - 288 ng/mL  Urine Culture     Status: None   Collection Time: 12/15/23  2:26 PM   Specimen: Urine  Result Value Ref Range   MICRO NUMBER: [id number]    SPECIMEN QUALITY: Adequate    Sample Source URINE    STATUS: FINAL    Result:      Less than 10,000 CFU/mL of single Gram negative organism isolated. No further testing will be performed. If clinically indicated, recollection using a method to minimize contamination, with prompt transfer to Urine Culture Transport Tube, is recommended.  POCT urinalysis dipstick     Status: Abnormal   Collection Time: 12/25/23 10:56 AM  Result Value Ref Range   Color, UA yellow    Clarity, UA cloudy    Glucose, UA Negative Negative   Bilirubin, UA small    Ketones, UA Negative    Spec Grav, UA 1.025 1.010 - 1.025   Blood, UA moderate    pH, UA 6.0 5.0 - 8.0   Protein, UA Negative Negative   Urobilinogen, UA 0.2 0.2 or 1.0 E.U./dL   Nitrite, UA Negative    Leukocytes, UA Moderate (2+) (A) Negative   Appearance yellow]    Odor foul       PHQ2/9:    12/25/2023   10:48 AM 12/15/2023    1:27 PM 12/05/2023    9:16 AM 09/08/2023    2:27 PM 06/02/2023    9:23 AM  Depression screen PHQ 2/9  Decreased Interest 1 1 0 0 0  Down, Depressed, Hopeless 1 1 0 0 0  PHQ - 2 Score 2 2 0 0 0  Altered sleeping 1 1 3  0 0  Tired,  decreased energy 1 1 0 0 0  Change in appetite 0 0 0 0 0  Feeling bad or failure about yourself  0 0 0 0 0  Trouble concentrating 1 1 0 0 0  Moving slowly or fidgety/restless 0 0 0 0 0  Suicidal thoughts 0 0 0 0 0  PHQ-9 Score 5 5 3  0 0  Difficult doing work/chores Somewhat difficult Somewhat difficult Somewhat difficult Not difficult at all Not difficult at all    phq 9 is positive  Fall Risk:    12/25/2023   10:45 AM 12/15/2023    1:27 PM 12/05/2023    9:14 AM 09/08/2023    2:27 PM 06/02/2023    9:16 AM  Fall Risk   Falls in the past year? 0 0 0 0 0  Number falls in past yr: 0 0 0 0 0  Injury with Fall? 0 0 0 0 0  Risk for fall due to : No Fall Risks No Fall Risks No Fall Risks No Fall Risks No Fall Risks  Follow up Falls evaluation completed Falls evaluation completed Falls evaluation completed Falls prevention discussed;Education provided;Falls evaluation completed Falls prevention discussed;Education provided;Falls evaluation completed     Assessment & Plan Cystocele (bladder prolapse) Chronic pelvic discomfort and pressure due to confirmed bladder prolapse. Symptoms likely exacerbated by weak pelvic floor muscles.  - Refer to urologist for evaluation and potential surgical intervention. - Educated on addressing cystocele to alleviate symptoms and improve quality of life.  Constipation Chronic constipation may contribute to pelvic floor weakness and discomfort. Inconsistent adherence to stool softeners and Miralax. - Advise daily use of Miralax, starting with one capful daily, adjusting to half a capful as needed. - Encourage high fiber diet for bowel regularity.  Major depressive disorder Depression exacerbated by life stressors, including caregiving challenges. Current treatment includes Celexa  and Ambien . Discussed Wellbutrin  augmentation to improve energy and mood. - Prescribe Wellbutrin  in the morning to augment treatment and improve energy. - Discussed importance of  self-care and seeking support for caregiving stress.

## 2023-12-25 NOTE — Telephone Encounter (Signed)
 FYI Only or Action Required?: FYI only for provider.  Patient was last seen in primary care on 12/15/2023 by Glenard Mire, MD.  Called Nurse Triage reporting Vaginal Pain.  Symptoms began about a month ago.  Interventions attempted: OTC medications: ibuprofen.  Symptoms are: unchanged.  Triage Disposition: See PCP When Office is Open (Within 3 Days)  Patient/caregiver understands and will follow disposition?: Yes      Copied from CRM [id number]. Topic: Clinical - Red Word Triage >> Dec 25, 2023  8:11 AM Berwyn MATSU wrote: Red Word that prompted transfer to Nurse Triage: pain and burning patient states she still has uti like symptoms. Reason for Disposition  Patient is worried they have a sexually transmitted infection (STI)  Answer Assessment - Initial Assessment Questions 1. SYMPTOM: What's the main symptom you're concerned about? (e.g., pain, itching, dryness)     pain 2. LOCATION: Where is the  pain located? (e.g., inside/outside, left/right)     outside 3. ONSET: When did the  pain  start?     Almost a month 4. PAIN: Is there any pain? If Yes, ask: How bad is it? (Scale: 1-10; mild, moderate, severe)     6/10 5. ITCHING: Is there any itching? If Yes, ask: How bad is it? (Scale: 1-10; mild, moderate, severe)     mild 6. CAUSE: What do you think is causing the discharge? Have you had the same problem before? What happened then?     Herpes outbreak 7. OTHER SYMPTOMS: Do you have any other symptoms? (e.g., fever, itching, vaginal bleeding, pain with urination, injury to genital area, vaginal foreign body)     Flank pain and vag discharge  Protocols used: Vaginal Symptoms-A-AH

## 2023-12-26 ENCOUNTER — Telehealth: Payer: Self-pay

## 2023-12-26 ENCOUNTER — Ambulatory Visit: Payer: Self-pay | Admitting: Family Medicine

## 2023-12-26 ENCOUNTER — Other Ambulatory Visit: Payer: Self-pay | Admitting: Family Medicine

## 2023-12-26 LAB — URINE CULTURE
MICRO NUMBER:: 16712887
Result:: NO GROWTH
SPECIMEN QUALITY:: ADEQUATE

## 2023-12-26 LAB — CERVICOVAGINAL ANCILLARY ONLY
Bacterial Vaginitis (gardnerella): POSITIVE — AB
Candida Glabrata: NEGATIVE
Candida Vaginitis: NEGATIVE
Chlamydia: NEGATIVE
Comment: NEGATIVE
Comment: NEGATIVE
Comment: NEGATIVE
Comment: NEGATIVE
Comment: NEGATIVE
Comment: NORMAL
Neisseria Gonorrhea: NEGATIVE
Trichomonas: NEGATIVE

## 2023-12-26 NOTE — Telephone Encounter (Signed)
 Called pt no answer left detailed vm to call back with her decision.

## 2023-12-26 NOTE — Telephone Encounter (Signed)
 Copied from CRM [id number]. Topic: General - Call Back - No Documentation >> Dec 26, 2023  3:46 PM Sasha H wrote: Reason for CRM: Pt was returning Maricela's call about the urology referral. Please reach out

## 2023-12-26 NOTE — Telephone Encounter (Signed)
 Copied from CRM [id number]. Topic: Referral - Status >> Dec 26, 2023 10:58 AM [place] B wrote: Reason for CRM:  Caller from urology office states received referral for pt, but states pt would be better to see a Urogynecologist instead

## 2023-12-29 ENCOUNTER — Other Ambulatory Visit: Payer: Self-pay | Admitting: Family Medicine

## 2023-12-29 ENCOUNTER — Other Ambulatory Visit: Payer: Self-pay

## 2023-12-29 DIAGNOSIS — R350 Frequency of micturition: Secondary | ICD-10-CM

## 2023-12-29 DIAGNOSIS — K5900 Constipation, unspecified: Secondary | ICD-10-CM

## 2023-12-29 DIAGNOSIS — N8110 Cystocele, unspecified: Secondary | ICD-10-CM

## 2023-12-29 DIAGNOSIS — R102 Pelvic and perineal pain: Secondary | ICD-10-CM

## 2023-12-29 MED ORDER — METRONIDAZOLE 250 MG PO TABS: 250.0000 mg | ORAL_TABLET | Freq: Two times a day (BID) | ORAL | 0 refills | Status: AC

## 2023-12-29 NOTE — Telephone Encounter (Signed)
 Explained to pt the urology referral was denied but they recommended UROGYN. I advised to be in the lookout for them call her in 1-2 weeks.

## 2023-12-30 ENCOUNTER — Other Ambulatory Visit: Payer: Self-pay

## 2024-01-07 ENCOUNTER — Ambulatory Visit: Admitting: Family Medicine

## 2024-01-07 ENCOUNTER — Encounter: Payer: Self-pay | Admitting: Family Medicine

## 2024-01-07 VITALS — BP 114/66 | HR 77 | Resp 16 | Ht 63.0 in | Wt 160.3 lb

## 2024-01-07 DIAGNOSIS — E1129 Type 2 diabetes mellitus with other diabetic kidney complication: Secondary | ICD-10-CM

## 2024-01-07 DIAGNOSIS — K219 Gastro-esophageal reflux disease without esophagitis: Secondary | ICD-10-CM

## 2024-01-07 DIAGNOSIS — R350 Frequency of micturition: Secondary | ICD-10-CM

## 2024-01-07 DIAGNOSIS — G4700 Insomnia, unspecified: Secondary | ICD-10-CM

## 2024-01-07 DIAGNOSIS — E785 Hyperlipidemia, unspecified: Secondary | ICD-10-CM | POA: Diagnosis not present

## 2024-01-07 DIAGNOSIS — I252 Old myocardial infarction: Secondary | ICD-10-CM

## 2024-01-07 DIAGNOSIS — I10 Essential (primary) hypertension: Secondary | ICD-10-CM

## 2024-01-07 DIAGNOSIS — F330 Major depressive disorder, recurrent, mild: Secondary | ICD-10-CM | POA: Diagnosis not present

## 2024-01-07 DIAGNOSIS — R102 Pelvic and perineal pain: Secondary | ICD-10-CM

## 2024-01-07 DIAGNOSIS — E1169 Type 2 diabetes mellitus with other specified complication: Secondary | ICD-10-CM

## 2024-01-07 DIAGNOSIS — I2510 Atherosclerotic heart disease of native coronary artery without angina pectoris: Secondary | ICD-10-CM

## 2024-01-07 DIAGNOSIS — M5410 Radiculopathy, site unspecified: Secondary | ICD-10-CM

## 2024-01-07 LAB — POCT GLYCOSYLATED HEMOGLOBIN (HGB A1C): Hemoglobin A1C: 6.6 % — AB (ref 4.0–5.6)

## 2024-01-07 MED ORDER — CITALOPRAM HYDROBROMIDE 20 MG PO TABS: 20.0000 mg | ORAL_TABLET | Freq: Every day | ORAL | 1 refills | Status: DC

## 2024-01-07 MED ORDER — CELECOXIB 100 MG PO CAPS: 100.0000 mg | ORAL_CAPSULE | Freq: Two times a day (BID) | ORAL | 0 refills | Status: AC | PRN

## 2024-01-07 MED ORDER — LOSARTAN POTASSIUM 25 MG PO TABS: 25.0000 mg | ORAL_TABLET | Freq: Every day | ORAL | 3 refills | Status: AC

## 2024-01-07 MED ORDER — EZETIMIBE 10 MG PO TABS: 10.0000 mg | ORAL_TABLET | Freq: Every day | ORAL | 3 refills | Status: AC

## 2024-01-07 MED ORDER — ZOLPIDEM TARTRATE 10 MG PO TABS: 10.0000 mg | ORAL_TABLET | Freq: Every day | ORAL | 0 refills | Status: DC

## 2024-01-07 MED ORDER — METFORMIN HCL ER 750 MG PO TB24: 1500.0000 mg | ORAL_TABLET | Freq: Every day | ORAL | 1 refills | Status: DC

## 2024-01-07 MED ORDER — BUSPIRONE HCL 5 MG PO TABS: 5.0000 mg | ORAL_TABLET | Freq: Two times a day (BID) | ORAL | 0 refills | Status: AC | PRN

## 2024-01-07 MED ORDER — CARVEDILOL 3.125 MG PO TABS: 3.1250 mg | ORAL_TABLET | Freq: Two times a day (BID) | ORAL | 1 refills | Status: DC

## 2024-01-07 MED ORDER — ROSUVASTATIN CALCIUM 40 MG PO TABS: 40.0000 mg | ORAL_TABLET | Freq: Every day | ORAL | 3 refills | Status: AC

## 2024-01-07 MED ORDER — PREGABALIN 50 MG PO CAPS: 50.0000 mg | ORAL_CAPSULE | Freq: Every evening | ORAL | 1 refills | Status: AC

## 2024-01-07 NOTE — Progress Notes (Signed)
 Name: Dawn Werner   MRN: [id number]    DOB: September 25, 1950   Date:01/07/2024       Progress Note  Subjective  Chief Complaint  Chief Complaint  Patient presents with   Medical Management of Chronic Issues   Discussed the use of AI scribe software for clinical note transcription with the patient, who gave verbal consent to proceed.  History of Present Illness Dawn  A Werner is a 73 year old female with coronary artery disease and diabetes who presents with lower back and pelvic pain.  She experiences significant lower back pain, described as 'killing' her. The pain is persistent, worsens with movement or when lying in bed, and has not improved despite changing mattresses. She takes pregabalin  50 mg at night and Celebrex  as needed. She states radiculitis has resolved but has dull aching pain with movement  She also reports pelvic pain with urinary frequency and urgency. There is a sensation of heaviness in the pelvic area, difficulty initiating urination, and episodes of sudden urinary urgency or inability to urinate. A previous culture in July showed no bacterial infection in the bladder, but bacterial vaginosis was noted, which did not alleviate her symptoms after treatment. She has an appointment with a urogynecologist on November 4th.  Her past medical history includes coronary artery disease and a history of myocardial infarction. She is on rosuvastatin  40 mg and ezetimibe  for dyslipidemia, with recent increases in LDL.   She has DM type 2 and is taking Metformin   without side effects. She takes losartan  25 mg for microalbuminuria associated with diabetes. Taking statin and zetia  for dyslipidemia. A1C today is at goal down to 6.6 %   She experiences symptoms of gastroesophageal reflux, primarily burping, and is taking omeprazole . She finds that light beer helps with burping.   She also takes Ambien  for sleep, which she has been using nightly for the past three months.  Her blood  pressure is generally well-controlled, and she takes carvedilol  3.125 mg, losartan  25 mg daily  and aspirin  for cardiovascular protection. No dizziness or lightheadedness upon standing, and no chest pain or palpitations.  She has a long history of recurrent  depression, currently managed with citalopram . She previously tried bupropion  but found it made her anxious.  Her social history includes being retired and living with her husband, who has been diagnosed with dementia. She is concerned about his safety, particularly when cooking.    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   Primary osteoarthritis of knee 01/24/2015   Asthma, mild intermittent 11/18/2014   Arteriosclerosis of coronary artery 11/18/2014   Insomnia, persistent 11/18/2014   Dyslipidemia 11/18/2014   Acid reflux 11/18/2014   Diabetes type 2, controlled (HCC) 11/18/2014   Genital herpes in women 11/18/2014   Hypertension, benign 11/18/2014   Asymptomatic postmenopausal status 11/18/2014   History of acute myocardial infarction 11/18/2014    Past Surgical History:  Procedure Laterality Date   ABDOMINAL HYSTERECTOMY     BREAST SURGERY     breast reduction   COLONOSCOPY  N/A 10/23/2023   Procedure: COLONOSCOPY;  Surgeon: Therisa Bi, MD;  Location: [facility] ENDOSCOPY;  Service: Gastroenterology;  Laterality: N/A;   REDUCTION MAMMAPLASTY Bilateral 1995   TOTAL HIP ARTHROPLASTY Left 03/31/2023   Procedure: TOTAL HIP ARTHROPLASTY ANTERIOR  APPROACH;  Surgeon: Lorelle Hussar, MD;  Location: [facility] ORS;  Service: Orthopedics;  Laterality: Left;   TUBAL LIGATION      Family History  Problem Relation Age of Onset   Hyperlipidemia Mother    Hypertension Mother    Stroke Mother    Dementia Father    Cancer Sister        breast   Breast cancer Sister 59   Asthma Brother    Depression Brother        bipolar   Heart disease Brother    Heart disease Brother     Social History   Tobacco Use   Smoking status: Never   Smokeless tobacco: Never  Substance Use Topics   Alcohol use: No    Alcohol/week: 0.0 standard drinks of alcohol     Current Outpatient Medications:    acetaminophen  (TYLENOL ) 500 MG tablet, Take 1,000 mg by mouth every 6 (six) hours as needed., Disp: , Rfl:    albuterol  (VENTOLIN  HFA) 108 (90 Base) MCG/ACT inhaler, Inhale 2-4 puffs into the lungs every 6 (six) hours as needed., Disp: 8 g, Rfl: 0   Blood Glucose Monitoring Suppl (ACCU-CHEK GUIDE ME) w/Device KIT, USE AS DIRECTED, Disp: 1 kit, Rfl: 0   buPROPion  (WELLBUTRIN  XL) 150 MG 24 hr tablet, Take 1 tablet (150 mg total) by mouth every morning., Disp: 90 tablet, Rfl: 0   carvedilol  (COREG ) 3.125 MG tablet, TAKE 1 TABLET(3.125 MG) BY MOUTH TWICE DAILY WITH A MEAL, Disp: 180 tablet, Rfl: 0   celecoxib  (CELEBREX ) 100 MG capsule, TAKE 1 CAPSULE(100 MG) BY MOUTH TWICE DAILY, Disp: 60 capsule, Rfl: 0   citalopram  (CELEXA ) 20 MG tablet, Take 1 tablet (20 mg total) by mouth daily., Disp: 90 tablet, Rfl: 1   docusate sodium  (COLACE) 100 MG capsule, Take 1 capsule (100 mg total) by mouth 2 (two) times daily., Disp: , Rfl:    ezetimibe  (ZETIA ) 10 MG tablet, Take 1 tablet (10 mg total) by mouth daily., Disp: 90 tablet, Rfl: 1   losartan  (COZAAR ) 25 MG tablet, Take 1 tablet (25 mg total) by mouth daily., Disp: 90 tablet, Rfl: 3   metFORMIN  (GLUCOPHAGE -XR) 750 MG 24 hr tablet, Take 2 tablets (1,500 mg total) by mouth daily with breakfast., Disp: 180 tablet, Rfl: 1    omeprazole  (PRILOSEC) 40 MG capsule, TAKE 1 CAPSULE(40 MG) BY MOUTH DAILY, Disp: 90 capsule, Rfl: 3   pregabalin  (LYRICA ) 50 MG capsule, Take 1-2 capsules (50-100 mg total) by mouth 2 (two) times daily., Disp: 120 capsule, Rfl: 0   rosuvastatin  (CRESTOR ) 40 MG tablet, Take 1 tablet (40 mg total) by mouth daily., Disp: 90 tablet, Rfl: 3   zolpidem  (AMBIEN ) 10 MG tablet, TAKE 1 TABLET(10 MG) BY MOUTH AT BEDTIME, Disp: 90 tablet, Rfl: 0  Allergies  Allergen Reactions   Metformin  And Related Diarrhea    And nausea. Tolerates XR formulation    I personally reviewed active problem list, medication list, allergies with the patient/caregiver today.   ROS  Ten systems reviewed and is negative except as mentioned in HPI    Objective Physical Exam  CONSTITUTIONAL: Patient appears well-developed and well-nourished. No distress. HEENT: Head atraumatic, normocephalic, neck supple. CARDIOVASCULAR: Normal rate, regular rhythm and normal heart sounds.  No murmur heard. No BLE edema. PULMONARY: Effort normal and breath sounds normal. Lungs clear to auscultation bilaterally. No respiratory distress. MUSCULOSKELETAL: Normal gait. Without gross motor or sensory deficit. PSYCHIATRIC: Patient has a normal mood and affect. Behavior is normal. Judgment and thought content normal.  Vitals:   01/07/24 1025  BP: 114/66  Pulse: 77  Resp: 16  SpO2: 97%  Weight: 160 lb 4.8 oz (72.7 kg)  Height: 5' 3 (1.6 m)    Body mass index is 28.4 kg/m.  Recent Results (from the past 2160 hours)  Glucose, capillary     Status: Abnormal   Collection Time: 10/23/23  9:10 AM  Result Value Ref Range   Glucose-Capillary 121 (H) 70 - 99 mg/dL    Comment: Glucose reference range applies only to samples taken after fasting for at least 8 hours.  Urinalysis, w/ Reflex to Culture (Infection Suspected) -Urine, Clean Catch     Status: Abnormal   Collection Time: 12/06/23 10:23 AM  Result Value Ref Range   Specimen Source  URINE, CLEAN CATCH    Color, Urine YELLOW YELLOW   APPearance CLEAR CLEAR   Specific Gravity, Urine 1.020 1.005 - 1.030   pH 7.0 5.0 - 8.0   Glucose, UA NEGATIVE NEGATIVE mg/dL   Hgb urine dipstick TRACE (A) NEGATIVE   Bilirubin Urine NEGATIVE NEGATIVE   Ketones, ur NEGATIVE NEGATIVE mg/dL   Protein, ur NEGATIVE NEGATIVE mg/dL   Nitrite NEGATIVE NEGATIVE   Leukocytes,Ua LARGE (A) NEGATIVE   Squamous Epithelial / HPF 0-5 0 - 5 /HPF   WBC, UA 6-10 0 - 5 WBC/hpf    Comment: Reflex urine culture not performed if WBC <=10, OR if Squamous epithelial cells >5. If Squamous epithelial cells >5, suggest recollection.   RBC / HPF 0-5 0 - 5 RBC/hpf   Bacteria, UA MANY (A) NONE SEEN    Comment: Performed at [facility], [address] [place], [place] [address]  POCT urinalysis dipstick     Status: Abnormal   Collection Time: 12/15/23  1:41 PM  Result Value Ref Range   Color, UA Yellow    Clarity, UA cloudy    Glucose, UA Negative Negative   Bilirubin, UA Negative    Ketones, UA Negative    Spec Grav, UA 1.020 1.010 - 1.025   Blood, UA Moderate    pH, UA 5.0 5.0 - 8.0   Protein, UA Negative Negative   Urobilinogen, UA 0.2 0.2 or 1.0 E.U./dL   Nitrite, UA Negative    Leukocytes, UA Moderate (2+) (A) Negative   Appearance yellow    Odor foul   Microalbumin / creatinine urine ratio     Status: None   Collection Time: 12/15/23  2:12 PM  Result Value Ref Range   Creatinine, Urine 118 20 - 275 mg/dL   Microalb, Ur 0.9 mg/dL    Comment: Reference Range Not established    Microalb Creat Ratio 8 <30 mg/g creat    Comment: . The ADA defines abnormalities in albumin excretion as follows: SABRA Albuminuria Category        Result (mg/g creatinine) . Normal to Mildly increased   <30 Moderately increased         30-299  Severely increased           > OR = 300 . The ADA recommends that at least two of three specimens collected within a 3-6 month period be abnormal before  considering a patient to be within a  diagnostic category.   Lipid panel     Status: Abnormal   Collection Time: 12/15/23  2:12 PM  Result Value Ref Range   Cholesterol 180 <200 mg/dL   HDL 49 (L) > OR = 50 mg/dL   Triglycerides 866 <849 mg/dL   LDL Cholesterol (Calc) 106 (H) mg/dL (calc)    Comment: Reference range: <100 . Desirable range <100 mg/dL for primary prevention;   <70 mg/dL for patients with CHD or diabetic patients  with > or = 2 CHD risk factors. SABRA LDL-C is now calculated using the Martin-Hopkins  calculation, which is a validated novel method providing  better accuracy than the Friedewald equation in the  estimation of LDL-C.  Gladis APPLETHWAITE et al. SANDREA. 7986;689(80): 2061-2068  (http://education.[facility].com/faq/FAQ164)    Total CHOL/HDL Ratio 3.7 <5.0 (calc)   Non-HDL Cholesterol (Calc) 131 (H) <130 mg/dL (calc)    Comment: For patients with diabetes plus 1 major ASCVD risk  factor, treating to a non-HDL-C goal of <100 mg/dL  (LDL-C of <29 mg/dL) is considered a therapeutic  option.   CBC with Differential/Platelet     Status: Abnormal   Collection Time: 12/15/23  2:12 PM  Result Value Ref Range   WBC 6.7 3.8 - 10.8 Thousand/uL   RBC 4.46 3.80 - 5.10 Million/uL   Hemoglobin 12.5 11.7 - 15.5 g/dL   HCT 60.7 64.9 - 54.9 %   MCV 87.9 80.0 - 100.0 fL   MCH 28.0 27.0 - 33.0 pg   MCHC 31.9 (L) 32.0 - 36.0 g/dL    Comment: For adults, a slight decrease in the calculated MCHC value (in the range of 30 to 32 g/dL) is most likely not clinically significant; however, it should be interpreted with caution in correlation with other red cell parameters and the patient's clinical condition.    RDW 11.9 11.0 - 15.0 %   Platelets 279 140 - 400 Thousand/uL   MPV 10.8 7.5 - 12.5 fL   Neutro Abs 3,685 1,500 - 7,800 cells/uL   Absolute Lymphocytes 2,338 850 - 3,900 cells/uL   Absolute Monocytes 509 200 - 950 cells/uL   Eosinophils Absolute 121 15 - 500 cells/uL    Basophils Absolute 47 0 - 200 cells/uL   Neutrophils Relative % 55 %   Total Lymphocyte 34.9 %   Monocytes Relative 7.6 %   Eosinophils Relative 1.8 %   Basophils Relative 0.7 %  COMPLETE METABOLIC PANEL WITHOUT GFR     Status: Abnormal   Collection Time: 12/15/23  2:12 PM  Result Value Ref Range   Glucose, Bld 104 (H) 65 - 99 mg/dL    Comment: .            Fasting reference interval . For someone without known diabetes, a glucose value between 100 and 125 mg/dL is consistent with prediabetes and should be confirmed with a follow-up test. .    BUN 9 7 - 25 mg/dL   Creat 9.37 9.39 - 8.99 mg/dL   BUN/Creatinine Ratio SEE NOTE: 6 - 22 (calc)    Comment:    Not Reported: BUN and Creatinine are within    reference range. .    Sodium 139 135 - 146 mmol/L   Potassium 4.3 3.5 - 5.3 mmol/L   Chloride 102 98 - 110 mmol/L   CO2 28 20 - 32 mmol/L   Calcium  9.6 8.6 - 10.4 mg/dL   Total Protein 7.1 6.1 - 8.1 g/dL   Albumin 4.7 3.6 - 5.1 g/dL  Globulin 2.4 1.9 - 3.7 g/dL (calc)   AG Ratio 2.0 1.0 - 2.5 (calc)   Total Bilirubin 0.4 0.2 - 1.2 mg/dL   Alkaline phosphatase (APISO) 73 37 - 153 U/L   AST 37 (H) 10 - 35 U/L   ALT 32 (H) 6 - 29 U/L  B12 and Folate Panel     Status: None   Collection Time: 12/15/23  2:12 PM  Result Value Ref Range   Vitamin B-12 395 200 - 1,100 pg/mL    Comment: . Please Note: Although the reference range for vitamin B12 is [id number] pg/mL, it has been reported that between 5 and 10% of patients with values between 200 and 400 pg/mL may experience neuropsychiatric and hematologic abnormalities due to occult B12 deficiency; less than 1% of patients with values above 400 pg/mL will have symptoms. .    Folate 19.4 ng/mL    Comment:                            Reference Range                            Low:           <3.4                            Borderline:    3.4-5.4                            Normal:        >5.4 .   VITAMIN D  25 Hydroxy (Vit-D  Deficiency, Fractures)     Status: None   Collection Time: 12/15/23  2:12 PM  Result Value Ref Range   Vit D, 25-Hydroxy 39 30 - 100 ng/mL    Comment: Vitamin D  Status         25-OH Vitamin D : . Deficiency:                    <20 ng/mL Insufficiency:             20 - 29 ng/mL Optimal:                 > or = 30 ng/mL . For 25-OH Vitamin D  testing on patients on  D2-supplementation and patients for whom quantitation  of D2 and D3 fractions is required, the [facility](TM) 25-OH VIT D, (D2,D3), LC/MS/MS is recommended: order  code 07111 (patients >27yrs). . See Note 1 . Note 1 . For additional information, please refer to  http://education.[facility].com/faq/FAQ199  (This link is being provided for informational/ educational purposes only.)   Iron, TIBC and Ferritin Panel     Status: None   Collection Time: 12/15/23  2:12 PM  Result Value Ref Range   Iron 114 45 - 160 mcg/dL   TIBC 666 749 - 549 mcg/dL (calc)   %SAT 34 16 - 45 % (calc)   Ferritin 35 16 - 288 ng/mL  Urine Culture     Status: None   Collection Time: 12/15/23  2:26 PM   Specimen: Urine  Result Value Ref Range   MICRO NUMBER: [id number]    SPECIMEN QUALITY: Adequate    Sample Source URINE    STATUS: FINAL    Result:      Less than 10,000 CFU/mL of  single Gram negative organism isolated. No further testing will be performed. If clinically indicated, recollection using a method to minimize contamination, with prompt transfer to Urine Culture Transport Tube, is recommended.  POCT urinalysis dipstick     Status: Abnormal   Collection Time: 12/25/23 10:56 AM  Result Value Ref Range   Color, UA yellow    Clarity, UA cloudy    Glucose, UA Negative Negative   Bilirubin, UA small    Ketones, UA Negative    Spec Grav, UA 1.025 1.010 - 1.025   Blood, UA moderate    pH, UA 6.0 5.0 - 8.0   Protein, UA Negative Negative   Urobilinogen, UA 0.2 0.2 or 1.0 E.U./dL   Nitrite, UA Negative    Leukocytes, UA Moderate  (2+) (A) Negative   Appearance yellow]    Odor foul   Cervicovaginal ancillary only     Status: Abnormal   Collection Time: 12/25/23 11:14 AM  Result Value Ref Range   Neisseria Gonorrhea Negative    Chlamydia Negative    Trichomonas Negative    Bacterial Vaginitis (gardnerella) Positive (A)    Candida Vaginitis Negative    Candida Glabrata Negative    Comment Normal Reference Ranger Chlamydia - Negative    Comment      Normal Reference Range Neisseria Gonorrhea - Negative   Comment Normal Reference Range Candida Species - Negative    Comment Normal Reference Range Candida Galbrata - Negative    Comment Normal Reference Range Trichomonas - Negative    Comment      Normal Reference Range Bacterial Vaginosis - Negative  Urine Culture     Status: None   Collection Time: 12/25/23 12:06 PM   Specimen: Urine  Result Value Ref Range   MICRO NUMBER: [id number]    SPECIMEN QUALITY: Adequate    Sample Source URINE, CLEAN CATCH    STATUS: FINAL    Result: No Growth   POCT glycosylated hemoglobin (Hb A1C)     Status: Abnormal   Collection Time: 01/07/24 10:30 AM  Result Value Ref Range   Hemoglobin A1C 6.6 (A) 4.0 - 5.6 %   HbA1c POC (<> result, manual entry)     HbA1c, POC (prediabetic range)     HbA1c, POC (controlled diabetic range)      Diabetic Foot Exam:     PHQ2/9:    01/07/2024   10:25 AM 12/25/2023   10:48 AM 12/15/2023    1:27 PM 12/05/2023    9:16 AM 09/08/2023    2:27 PM  Depression screen PHQ 2/9  Decreased Interest 1 1 1  0 0  Down, Depressed, Hopeless 1 1 1  0 0  PHQ - 2 Score 2 2 2  0 0  Altered sleeping 1 1 1 3  0  Tired, decreased energy 1 1 1  0 0  Change in appetite 0 0 0 0 0  Feeling bad or failure about yourself  0 0 0 0 0  Trouble concentrating 1 1 1  0 0  Moving slowly or fidgety/restless 0 0 0 0 0  Suicidal thoughts 0 0 0 0 0  PHQ-9 Score 5 5 5 3  0  Difficult doing work/chores Somewhat difficult Somewhat difficult Somewhat difficult Somewhat difficult Not  difficult at all    phq 9 is positive  Fall Risk:    01/07/2024   10:19 AM 12/25/2023   10:45 AM 12/15/2023    1:27 PM 12/05/2023    9:14 AM 09/08/2023    2:27 PM  Fall  Risk   Falls in the past year? 0 0 0 0 0  Number falls in past yr: 0 0 0 0 0  Injury with Fall? 0 0 0 0 0  Risk for fall due to : No Fall Risks No Fall Risks No Fall Risks No Fall Risks No Fall Risks  Follow up Falls evaluation completed Falls evaluation completed Falls evaluation completed Falls evaluation completed Falls prevention discussed;Education provided;Falls evaluation completed      Assessment & Plan Pelvic organ prolapse with pelvic pain and urinary symptoms Chronic pelvic organ prolapse with urinary frequency and urgency, likely due to bladder prolapse. Constipation may contribute to discomfort. - Encourage contacting urogynecologist for potential earlier appointment due to cancellation. - Advise informing urogynecologist of availability for short-notice appointments.  Type 2 diabetes mellitus with associated Dyslipidemia and CAD Improved glycemic control with A1c decreased to 6.6%. No hyperglycemia symptoms. Microalbumin levels normalized. - Continue metformin  ER 750 mg twice daily.  Hyperlipidemia associated with diabetes Hyperlipidemia with increased LDL cholesterol. HDL improved. Compliant with medication. - Continue rosuvastatin  40 mg daily. - Continue ezetimibe  10 mg daily  - Recheck lipid panel in 6 months. - Encourage adherence to diet and physical activity.  Coronary artery disease, status post non-ST elevation myocardial infarction Coronary artery disease with well-controlled blood pressure. No chest pain or palpitations. - Continue carvedilol  3.125 mg. - Losartan  25 mg daily  - Statin and Zetia   - Continue aspirin .  Hypertension Hypertension well-controlled with current regimen. Blood pressure 114/66 mmHg. - Continue current antihypertensive regimen.  Chronic low back pain,  Chronic  low back pain managed with pregabalin  and Celebrex . - Continue pregabalin  50 mg at night controlling radiculitis  - Continue Celebrex  as needed. - Provide prescription for 90 days supply of pregabalin . - Advise to take pregabalin  with dinner and Ambien  at bedtime.  Constipation Constipation contributing to pelvic discomfort.  Gastroesophageal reflux with burping Gastroesophageal reflux with burping. Relief with light beer. Currently on omeprazole . - Continue omeprazole . - Consider trying Gas-X if symptoms persist. - she wants to hold off on following up with GI   Depression Major Recurrent Mild  Depression with increased stress. Citalopram  well-tolerated, bupropion  caused anxiety. - Continue citalopram  20 mg daily  - Prescribe Buspar  5 mg as needed for anxiety, up to 20 mg per day  - Provide prescription for 180 tablets of Buspar . To take prn  Insomnia Chronic insomnia managed with Ambien . - Continue Ambien . - Provide prescription for 90 tablets of Ambien .  Vitamin B12 deficiency Vitamin B12 deficiency with level at 395. - Continue sublingual B12 supplement a few times a week.

## 2024-02-09 ENCOUNTER — Other Ambulatory Visit: Payer: Self-pay | Admitting: Family Medicine

## 2024-02-09 DIAGNOSIS — I252 Old myocardial infarction: Secondary | ICD-10-CM

## 2024-02-09 DIAGNOSIS — E1169 Type 2 diabetes mellitus with other specified complication: Secondary | ICD-10-CM

## 2024-02-09 DIAGNOSIS — E1129 Type 2 diabetes mellitus with other diabetic kidney complication: Secondary | ICD-10-CM

## 2024-02-09 DIAGNOSIS — E785 Hyperlipidemia, unspecified: Secondary | ICD-10-CM

## 2024-03-02 ENCOUNTER — Other Ambulatory Visit: Payer: Self-pay

## 2024-03-02 ENCOUNTER — Emergency Department

## 2024-03-02 ENCOUNTER — Encounter: Payer: Self-pay | Admitting: Emergency Medicine

## 2024-03-02 ENCOUNTER — Ambulatory Visit
Admission: EM | Admit: 2024-03-02 | Discharge: 2024-03-02 | Disposition: A | Discharge status: Other Acute Inpatient Hospital | Source: Home / Self Care | Attending: Emergency Medicine | Admitting: Emergency Medicine

## 2024-03-02 ENCOUNTER — Emergency Department
Admission: EM | Admit: 2024-03-02 | Discharge: 2024-03-02 | Disposition: A | Discharge status: Home / Self Care | Attending: Emergency Medicine | Admitting: Emergency Medicine

## 2024-03-02 ENCOUNTER — Ambulatory Visit: Payer: Self-pay

## 2024-03-02 DIAGNOSIS — R339 Retention of urine, unspecified: Secondary | ICD-10-CM

## 2024-03-02 DIAGNOSIS — R109 Unspecified abdominal pain: Secondary | ICD-10-CM | POA: Insufficient documentation

## 2024-03-02 DIAGNOSIS — N8110 Cystocele, unspecified: Secondary | ICD-10-CM

## 2024-03-02 DIAGNOSIS — M545 Low back pain, unspecified: Secondary | ICD-10-CM | POA: Diagnosis present

## 2024-03-02 DIAGNOSIS — E876 Hypokalemia: Secondary | ICD-10-CM | POA: Diagnosis not present

## 2024-03-02 LAB — COMPREHENSIVE METABOLIC PANEL WITH GFR
ALT: 24 U/L (ref 0–44)
AST: 28 U/L (ref 15–41)
Albumin: 3.9 g/dL (ref 3.5–5.0)
Alkaline Phosphatase: 47 U/L (ref 38–126)
Anion gap: 11 (ref 5–15)
BUN: 9 mg/dL (ref 8–23)
CO2: 26 mmol/L (ref 22–32)
Calcium: 9.1 mg/dL (ref 8.9–10.3)
Chloride: 102 mmol/L (ref 98–111)
Creatinine, Ser: 0.54 mg/dL (ref 0.44–1.00)
GFR, Estimated: >60 mL/min (ref >60)
Glucose, Bld: 144 mg/dL — ABNORMAL HIGH (ref 70–99)
Potassium: 3.4 mmol/L — ABNORMAL LOW (ref 3.5–5.1)
Sodium: 139 mmol/L (ref 135–145)
Total Bilirubin: 0.5 mg/dL (ref 0.0–1.2)
Total Protein: 6.8 g/dL (ref 6.5–8.1)

## 2024-03-02 LAB — CBC
HCT: 39.9 % (ref 36.0–46.0)
Hemoglobin: 12.8 g/dL (ref 12.0–15.0)
MCH: 28.6 pg (ref 26.0–34.0)
MCHC: 32.1 g/dL (ref 30.0–36.0)
MCV: 89.3 fL (ref 80.0–100.0)
Platelets: 257 K/uL (ref 150–400)
RBC: 4.47 MIL/uL (ref 3.87–5.11)
RDW: 11.8 % (ref 11.5–15.5)
WBC: 6.3 K/uL (ref 4.0–10.5)
nRBC: 0 % (ref 0.0–0.2)

## 2024-03-02 LAB — URINALYSIS, W/ REFLEX TO CULTURE (INFECTION SUSPECTED)
Glucose, UA: NEGATIVE mg/dL
Hgb urine dipstick: NEGATIVE
Ketones, ur: NEGATIVE mg/dL
Leukocytes,Ua: NEGATIVE
Nitrite: NEGATIVE
Protein, ur: 30 mg/dL — AB
Specific Gravity, Urine: >1.03 — ABNORMAL HIGH (ref 1.005–1.030)
pH: 5.5 (ref 5.0–8.0)

## 2024-03-02 LAB — LIPASE, BLOOD: Lipase: 41 U/L (ref 11–51)

## 2024-03-02 NOTE — ED Triage Notes (Signed)
 Pt was seen at [facility] in [place]. Per pt d/c papers, the provider at the UC sts that she believes that pt has a fallen bladder and pt urethra is blocked. Pt sts that she goes to see a uro obgyn on Nov 4th with Dr. Guadlupe.

## 2024-03-02 NOTE — ED Provider Notes (Signed)
 [facility] Provider Note    Event Date/Time   First MD Initiated Contact with Patient 03/02/24 1624     (approximate)   History   Flank Pain   HPI  Dawn Werner is a 73 y.o. female who presents to the emergency department today from urgent care.  Patient went to urgent care today because concerns for left flank pain.  This did occur in the setting of longtime history of urinary issues.  She states that has been going on for a long time.  She will sometimes feel like she has a hard time urinating.  This does wax and wane.  She spoke to her doctor who has arranged for her to see urogyn in a couple of months.  Today she started having worsening left flank pain.  Went to urgent care where UA was done which was positive for calcium  oxalate crystals.  Given concern for possible kidney stones or hydronephrosis he was sent to the emergency department.  She denies any fevers or chills.     Physical Exam   Triage Vital Signs: ED Triage Vitals [03/02/24 1432]  Encounter Vitals Group     BP 138/63     Girls Systolic BP Percentile      Girls Diastolic BP Percentile      Boys Systolic BP Percentile      Boys Diastolic BP Percentile      Pulse Rate 76     Resp 17     Temp (!) 97.5 F (36.4 C)     Temp Source Oral     SpO2 98 %     Weight 156 lb (70.8 kg)     Height 5' 3 (1.6 m)     Head Circumference      Peak Flow      Pain Score 4     Pain Loc      Pain Education      Exclude from Growth Chart     Most recent vital signs: Vitals:   03/02/24 1432  BP: 138/63  Pulse: 76  Resp: 17  Temp: (!) 97.5 F (36.4 C)  SpO2: 98%   General: Awake, alert, oriented. CV:  Good peripheral perfusion. Regular rate and rhythm. Resp:  Normal effort. Lungs clear. Abd:  No distention. Non tender. No CVA tenderness.   ED Results / Procedures / Treatments   Labs (all labs ordered are listed, but only abnormal results are displayed) Labs Reviewed   COMPREHENSIVE METABOLIC PANEL WITH GFR - Abnormal; Notable for the following components:      Result Value   Potassium 3.4 (*)    Glucose, Bld 144 (*)    All other components within normal limits  LIPASE, BLOOD  CBC  URINALYSIS, ROUTINE W REFLEX MICROSCOPIC     EKG  None   RADIOLOGY None   PROCEDURES:  Critical Care performed: No   MEDICATIONS ORDERED IN ED: Medications - No data to display   IMPRESSION / MDM / ASSESSMENT AND PLAN / ED COURSE  I reviewed the triage vital signs and the nursing notes.                              Differential diagnosis includes, but is not limited to, kidney stone, hydronephrosis  Patient's presentation is most consistent with acute presentation with potential threat to life or bodily function.   Patient presented to the emergency department today because of  concerns for left sided flank pain, urinary issues.  Patient was seen in urgent care and sent here given concern for possible kidney stone and hydronephrosis.  I did discuss with patient that CT scan would be our best study to evaluate for kidney.  Initially patient seemed okay with the plan.  CT scan was ordered.  However I was alerted by radiology staff that the patient refused CT given concerns for claustrophobia.  When I went to go talk to the patient she states she is ready to leave.  Do think this is reasonable.  No concerning leukocytosis or fever here.  Patient has been having urinary systems for months.  Did encourage patient to continue to follow-up with primary care and outpatient providers.   FINAL CLINICAL IMPRESSION(S) / ED DIAGNOSES   Final diagnoses:  Left flank pain    Note:  This document was prepared using Dragon voice recognition software and may include unintentional dictation errors.    Floy Roberts, MD 03/02/24 [phone]

## 2024-03-02 NOTE — ED Notes (Signed)
 Patient is being discharged from the Urgent Care and sent to the Emergency Department via POV . Per Dr.Mortenson, patient is in need of higher level of care due to urinary retention. Patient is aware and verbalizes understanding of plan of care.  Vitals:   03/02/24 1055  BP: (!) 140/78  Pulse: 60  Resp: 16  Temp: 97.6 F (36.4 C)  SpO2: 100%

## 2024-03-02 NOTE — ED Triage Notes (Signed)
 Pt has been dealing with not being able to completely empty her bladder and left side back pain for several months. She has been seen several times for this and has an appt on 04/13/24 with Urogyn.

## 2024-03-02 NOTE — ED Provider Notes (Addendum)
 HPI  SUBJECTIVE:  Dawn  A Werner is a 73 y.o. female who presents with worsening of her chronic left low back pain over the past 2 weeks.  It is dull, achy, intermittent, lasting hours.  She reports urinary urgency, frequency, cloudy urine and states that she is urinating small amounts at a time.  She states she feels as if she is unable to completely empty her bladder and reports occasional incontinence due to being unable to make it to the bathroom in time.  No dysuria, odorous urine, hematuria, vaginal odor, discharge, itching, pelvic or flank pain.  No lower abdominal or back swelling.  No radiation of this pain down into her abdomen or down her leg.  No fevers, nausea, vomiting.  No saddle anesthesia, fecal incontinence.  No numbness/tingling/weakness in her legs.  She tried Tylenol  with improvement in her symptoms.  No aggravating factors.  Her back pain is not associated with lifting, bending forward.  She has a past medical history of asthma, diabetes, coronary disease, hypertension, asthma, hyperlipidemia, MI, status post hysterectomy, HSV, cystocele, BV, left low back pain with radiculopathy, UTI.  States this does not feel similar to her lower back pain with radiculitis.  No history of nephrolithiasis, pyelonephritis.  She has an appointment with urogyn on November 4.  [facility]  She was seen here in June for UTI symptoms and sent home with 7 days of cefdinir .  Last 2 urine cultures in July have been negative.  Past Medical History:  Diagnosis Date   Anemia    Anxiety    Arthritis    Asthma    Coronary artery disease    Depression    Diabetes mellitus without complication (HCC)    GERD (gastroesophageal reflux disease)    Hyperlipidemia    Hypertension    Insomnia    Myocardial infarction [facility])     Past Surgical History:  Procedure Laterality Date   ABDOMINAL HYSTERECTOMY     BREAST SURGERY     breast reduction   COLONOSCOPY N/A 10/23/2023   Procedure:  COLONOSCOPY;  Surgeon: Therisa Bi, MD;  Location[facility] ENDOSCOPY;  Service: Gastroenterology;  Laterality: N/A;   REDUCTION MAMMAPLASTY Bilateral 1995   TOTAL HIP ARTHROPLASTY Left 03/31/2023   Procedure: TOTAL HIP ARTHROPLASTY ANTERIOR APPROACH;  Surgeon: Lorelle Hussar, MD;  Location: [facility] ORS;  Service: Orthopedics;  Laterality: Left;   TUBAL LIGATION      Family History  Problem Relation Age of Onset   Hyperlipidemia Mother    Hypertension Mother    Stroke Mother    Dementia Father    Cancer Sister        breast   Breast cancer Sister 51   Asthma Brother    Depression Brother        bipolar   Heart disease Brother    Heart disease Brother     Social History   Tobacco Use   Smoking status: Never   Smokeless tobacco: Never  Vaping Use   Vaping status: Never Used  Substance Use Topics   Alcohol use: No    Alcohol/week: 0.0 standard drinks of alcohol   Drug use: No    No current facility-administered medications for this encounter.  Current Outpatient Medications:    acetaminophen  (TYLENOL ) 500 MG tablet, Take 1,000 mg by mouth every 6 (six) hours as needed., Disp: , Rfl:    albuterol  (VENTOLIN  HFA) 108 (90 Base) MCG/ACT inhaler, Inhale 2-4 puffs into the lungs every 6 (six) hours as needed.,  Disp: 8 g, Rfl: 0   Blood Glucose Monitoring Suppl (ACCU-CHEK GUIDE ME) w/Device KIT, USE AS DIRECTED, Disp: 1 kit, Rfl: 0   busPIRone  (BUSPAR ) 5 MG tablet, Take 1 tablet (5 mg total) by mouth 2 (two) times daily as needed., Disp: 180 tablet, Rfl: 0   carvedilol  (COREG ) 3.125 MG tablet, Take 1 tablet (3.125 mg total) by mouth 2 (two) times daily with a meal., Disp: 180 tablet, Rfl: 1   celecoxib  (CELEBREX ) 100 MG capsule, Take 1 capsule (100 mg total) by mouth 2 (two) times daily as needed., Disp: 180 capsule, Rfl: 0   citalopram  (CELEXA ) 20 MG tablet, Take 1 tablet (20 mg total) by mouth daily., Disp: 90 tablet, Rfl: 1   docusate sodium  (COLACE) 100 MG capsule, Take 1 capsule  (100 mg total) by mouth 2 (two) times daily., Disp: , Rfl:    ezetimibe  (ZETIA ) 10 MG tablet, Take 1 tablet (10 mg total) by mouth daily., Disp: 90 tablet, Rfl: 3   losartan  (COZAAR ) 25 MG tablet, Take 1 tablet (25 mg total) by mouth daily., Disp: 90 tablet, Rfl: 3   metFORMIN  (GLUCOPHAGE -XR) 750 MG 24 hr tablet, Take 2 tablets (1,500 mg total) by mouth daily with breakfast., Disp: 180 tablet, Rfl: 1   omeprazole  (PRILOSEC) 40 MG capsule, TAKE 1 CAPSULE(40 MG) BY MOUTH DAILY, Disp: 90 capsule, Rfl: 3   pregabalin  (LYRICA ) 50 MG capsule, Take 1 capsule (50 mg total) by mouth every evening., Disp: 90 capsule, Rfl: 1   rosuvastatin  (CRESTOR ) 40 MG tablet, Take 1 tablet (40 mg total) by mouth daily., Disp: 90 tablet, Rfl: 3   zolpidem  (AMBIEN ) 10 MG tablet, Take 1 tablet (10 mg total) by mouth at bedtime., Disp: 90 tablet, Rfl: 0  Allergies  Allergen Reactions   Metformin  And Related Diarrhea    And nausea. Tolerates XR formulation     ROS  As noted in HPI.   Physical Exam  BP (!) 140/78 (BP Location: Left Arm)   Pulse 60   Temp 97.6 F (36.4 C) (Oral)   Resp 16   SpO2 100%   Constitutional: Well developed, well nourished, no acute distress Eyes:  EOMI, conjunctiva normal bilaterally HENT: Normocephalic, atraumatic,mucus membranes moist Respiratory: Normal inspiratory effort Cardiovascular: Normal rate GI: nondistended.  Soft.  Nontender.  No palpable lower abdominal mass.  Left upper flank tenderness. Back: Positive left CVAT.  Positive left paralumbar tenderness, no muscle spasm. No L spine, sacral, SI joint bony tenderness.  Bilateral lower extremities nontender without new rashes or color change, baseline ROM with intact DP pulses, .No pain with int/ext rotation, flex/extension, abduction/adduction hips bilaterally. SLR neg bilaterally. Sensation intact to light touch bilaterally over both legs, DTR's symmetric and intact bilaterally KJ, Motor symmetric bilateral 5/5 hip flexion,  quadriceps, hamstrings, EHL, foot dorsiflexion, foot plantarflexion, gait normal.   Skin: No rash, skin intact Musculoskeletal: no deformities Neurologic: Alert & oriented x 3, no focal neuro deficits Psychiatric: Speech and behavior appropriate   ED Course   Medications - No data to display  Orders Placed This Encounter  Procedures   Urinalysis, w/ Reflex to Culture (Infection Suspected) -Urine, Clean Catch    Standing Status:   Standing    Number of Occurrences:   1    Specimen Source:   Urine, Clean Catch [76]    Results for orders placed or performed during the hospital encounter of 03/02/24 (from the past 24 hours)  Urinalysis, w/ Reflex to Culture (Infection Suspected) -Urine, Clean  Catch     Status: Abnormal   Collection Time: 03/02/24 10:58 AM  Result Value Ref Range   Specimen Source URINE, CLEAN CATCH    Color, Urine YELLOW YELLOW   APPearance CLEAR CLEAR   Specific Gravity, Urine >1.030 (H) 1.005 - 1.030   pH 5.5 5.0 - 8.0   Glucose, UA NEGATIVE NEGATIVE mg/dL   Hgb urine dipstick NEGATIVE NEGATIVE   Bilirubin Urine SMALL (A) NEGATIVE   Ketones, ur NEGATIVE NEGATIVE mg/dL   Protein, ur 30 (A) NEGATIVE mg/dL   Nitrite NEGATIVE NEGATIVE   Leukocytes,Ua NEGATIVE NEGATIVE   Squamous Epithelial / HPF 6-10 0 - 5 /HPF   WBC, UA 0-5 0 - 5 WBC/hpf   RBC / HPF 11-20 0 - 5 RBC/hpf   Bacteria, UA FEW (A) NONE SEEN   Ca Oxalate Crys, UA PRESENT    No results found.  ED Clinical Impression  1. Urinary retention   2. Cystocele, unspecified   3. Left-sided low back pain without sciatica, unspecified chronicity      ED Assessment/Plan    Previous outside and urgent care records, labs reviewed.  As noted in HPI.  UA today concentrated with small bilirubin, proteinuria, a few bacteria, but it is a contaminated specimen.  No nitrites, esterase.  She also has calcium  oxalate crystals.  Concern for acute urinary retention/hydronephrosis with possible kidney damage.  I  suspect she needs a PVR, BMP and possibly an ultrasound to rule out hydronephrosis.  She also has calcium  oxalate crystals in her urine, differential includes nephrolithiasis.  We do not have catheters here in order to measure PVR.  I am sending her to the emergency department via private vehicle to rule out acute urinary obstruction/hydronephrosis.  Discussed rationale for transfer to the emergency department with patient and parent.  They have agreed to go to [facility].    No orders of the defined types were placed in this encounter.   Spent 30 minutes in the care of this pt.   *This clinic note was created using Dragon dictation software. Therefore, there may be occasional mistakes despite careful proofreading.  ?    Van Knee, MD 03/02/24 [id number]    Van Knee, MD 03/02/24 [phone]

## 2024-03-02 NOTE — ED Notes (Addendum)
 Pt reports pressure and pain in lower abd.  Sx for several weeks. Pt reports pain relieved after having BM's.  No pain now.  No vomiting or diarrhea.  Pt sent from [facility] urgent care.   Pt alert  urine result from [place] urgent care today in results.

## 2024-03-02 NOTE — Telephone Encounter (Signed)
 FYI Only or Action Required?: FYI only for provider.  Patient was last seen in primary care on 01/07/2024 by Glenard Mire, MD.  Called Nurse Triage reporting Dysuria.  Symptoms began yesterday.  Interventions attempted: Nothing.  Symptoms are: gradually worsening.  Triage Disposition: Go to ED Now (or PCP Triage)  Patient/caregiver understands and will follow disposition?: Yes  Was seen in UC today and instructed to go to ER.  Copied from CRM #[id number]. Topic: Clinical - Red Word Triage >> Mar 02, 2024  1:21 PM Dawna HERO wrote: Red Word that prompted transfer to Nurse Triage: pt says she saw a doctor today about UTI and they advised her to see her provider, states she has seen dr.sowles for this issue already and it is getting worse and is affecting her kidney, decision tree said to triage Reason for Disposition  [1] Decreased urination and [2] drinking very little AND [3] dehydration suspected (e.g., dark urine, no urine > 12 hours, very dry mouth, very lightheaded)  Answer Assessment - Initial Assessment Questions 1. SYMPTOM: What's the main symptom you're concerned about? (e.g., frequency, incontinence)     Was seen in UC; frequency, retention, dx UTI today. 2. ONSET: When did the  pain  start?     This past weekend, back pain 3. PAIN: Is there any pain? If Yes, ask: How bad is it? (Scale: 1-10; mild, moderate, severe)     Moderate, on and off 4. CAUSE: What do you think is causing the symptoms?     UTI 5. OTHER SYMPTOMS: Do you have any other symptoms? (e.g., blood in urine, fever, flank pain, pain with urination)     Difficulty to pee 6. PREGNANCY: Is there any chance you are pregnant? When was your last menstrual period?     na  Protocols used: Urinary Symptoms-A-AH

## 2024-03-02 NOTE — ED Notes (Signed)
 Pt states unable to do scan due to stress.  Dr floy aware

## 2024-03-02 NOTE — Discharge Instructions (Addendum)
 I am concerned that you could have acute urinary retention from a kinked urethra due to your prolapsed bladder.  I am concerned that you are having backup of the urine into your kidneys which can cause kidney damage.  You may need labs and additional imaging that is not available here today in order to rule out hydronephrosis.  Your urine was negative for urinary tract infection.  You do have some calcium  oxalate crystals in it, so the differential includes an obstructing kidney stone.  Please go to the emergency room for comprehensive workup and further management.  They may also be able to get you an appointment with urogynecology sooner.

## 2024-04-06 ENCOUNTER — Other Ambulatory Visit: Payer: Self-pay | Admitting: Family Medicine

## 2024-04-06 DIAGNOSIS — G4700 Insomnia, unspecified: Secondary | ICD-10-CM

## 2024-04-13 ENCOUNTER — Ambulatory Visit: Admitting: Obstetrics and Gynecology

## 2024-04-13 ENCOUNTER — Ambulatory Visit: Admitting: Obstetrics

## 2024-04-26 ENCOUNTER — Other Ambulatory Visit: Payer: Self-pay | Admitting: Family Medicine

## 2024-04-26 DIAGNOSIS — Z1231 Encounter for screening mammogram for malignant neoplasm of breast: Secondary | ICD-10-CM

## 2024-04-28 ENCOUNTER — Other Ambulatory Visit: Payer: Self-pay | Admitting: Family Medicine

## 2024-04-28 DIAGNOSIS — K219 Gastro-esophageal reflux disease without esophagitis: Secondary | ICD-10-CM

## 2024-04-30 NOTE — Telephone Encounter (Signed)
 Requested Prescriptions  Pending Prescriptions Disp Refills   omeprazole  (PRILOSEC) 40 MG capsule [Pharmacy Med Name: OMEPRAZOLE  40MG  CAPSULES] 90 capsule 3    Sig: TAKE 1 CAPSULE(40 MG) BY MOUTH DAILY     Gastroenterology: Proton Pump Inhibitors Passed - 04/30/2024  2:19 PM      Passed - Valid encounter within last 12 months    Recent Outpatient Visits           3 months ago Dyslipidemia associated with type 2 diabetes mellitus [facility])   [place] [facility] [place], Krichna, MD   4 months ago Mild episode of recurrent major depressive disorder   [facility] Glenard Mire, MD   4 months ago Urinary frequency   [place] [facility] Glenard Mire, MD   7 months ago Type 2 diabetes mellitus with microalbuminuria, without long-term current use of insulin  [facility])   [facility] Glenard Mire, MD       Future Appointments             In 1 week Sowles, Krichna, MD [facility], [place]

## 2024-05-01 ENCOUNTER — Other Ambulatory Visit: Payer: Self-pay | Admitting: Family Medicine

## 2024-05-01 DIAGNOSIS — K219 Gastro-esophageal reflux disease without esophagitis: Secondary | ICD-10-CM

## 2024-05-03 NOTE — Telephone Encounter (Signed)
 Requested Prescriptions  Pending Prescriptions Disp Refills   omeprazole  (PRILOSEC) 40 MG capsule [Pharmacy Med Name: OMEPRAZOLE  40MG  CAPSULES] 90 capsule 3    Sig: TAKE 1 CAPSULE(40 MG) BY MOUTH DAILY     Gastroenterology: Proton Pump Inhibitors Passed - 05/03/2024  3:10 PM      Passed - Valid encounter within last 12 months    Recent Outpatient Visits           3 months ago Dyslipidemia associated with type 2 diabetes mellitus [facility])   [place] [facility] [place], Krichna, MD   4 months ago Mild episode of recurrent major depressive disorder   [facility] Glenard Mire, MD   4 months ago Urinary frequency   [place] [facility] Glenard Mire, MD   7 months ago Type 2 diabetes mellitus with microalbuminuria, without long-term current use of insulin  [facility])   [facility] Glenard Mire, MD       Future Appointments             In 1 week Sowles, Krichna, MD [facility] [place]

## 2024-05-10 ENCOUNTER — Ambulatory Visit: Admitting: Family Medicine

## 2024-06-01 ENCOUNTER — Ambulatory Visit
Admission: RE | Admit: 2024-06-01 | Discharge: 2024-06-01 | Disposition: A | Discharge status: Home / Self Care | Source: Ambulatory Visit | Attending: Family Medicine | Admitting: Family Medicine

## 2024-06-01 DIAGNOSIS — Z1231 Encounter for screening mammogram for malignant neoplasm of breast: Secondary | ICD-10-CM | POA: Insufficient documentation

## 2024-07-04 ENCOUNTER — Other Ambulatory Visit: Payer: Self-pay | Admitting: Family Medicine

## 2024-07-04 DIAGNOSIS — I10 Essential (primary) hypertension: Secondary | ICD-10-CM

## 2024-07-04 DIAGNOSIS — I252 Old myocardial infarction: Secondary | ICD-10-CM

## 2024-07-05 ENCOUNTER — Other Ambulatory Visit: Payer: Self-pay | Admitting: Family Medicine

## 2024-07-05 DIAGNOSIS — I10 Essential (primary) hypertension: Secondary | ICD-10-CM

## 2024-07-05 DIAGNOSIS — I252 Old myocardial infarction: Secondary | ICD-10-CM

## 2024-07-05 NOTE — Telephone Encounter (Signed)
 Requested Prescriptions  Pending Prescriptions Disp Refills   carvedilol  (COREG ) 3.125 MG tablet [Pharmacy Med Name: CARVEDILOL  3.125MG  TABLETS] 60 tablet 0    Sig: TAKE 1 TABLET(3.125 MG) BY MOUTH TWICE DAILY WITH A MEAL     Cardiovascular: Beta Blockers 3 Failed - 07/05/2024  4:16 PM      Failed - Last BP in normal range    BP Readings from Last 1 Encounters:  03/02/24 (!) 162/78         Passed - Cr in normal range and within 360 days    Creat  Date Value Ref Range Status  12/15/2023 0.62 0.60 - 1.00 mg/dL Final   Creatinine, Ser  Date Value Ref Range Status  03/02/2024 0.54 0.44 - 1.00 mg/dL Final   Creatinine, Urine  Date Value Ref Range Status  12/15/2023 118 20 - 275 mg/dL Final         Passed - AST in normal range and within 360 days    AST  Date Value Ref Range Status  03/02/2024 28 15 - 41 U/L Final         Passed - ALT in normal range and within 360 days    ALT  Date Value Ref Range Status  03/02/2024 24 0 - 44 U/L Final         Passed - Last Heart Rate in normal range    Pulse Readings from Last 1 Encounters:  03/02/24 (!) 58         Passed - Valid encounter within last 6 months    Recent Outpatient Visits           6 months ago Dyslipidemia associated with type 2 diabetes mellitus [facility])   [place] [facility] [place], Dorette, MD   6 months ago Mild episode of recurrent major depressive disorder   [facility] Glenard Doret[facility], MD   6 months ago Urinary frequency   [facility] [place], Dorette, MD   10 months ago Type 2 diabetes mellitus with microalbuminuria, without long-term current use of insulin  [facility])   [facility] Glenard Dorette, MD

## 2024-07-06 NOTE — Telephone Encounter (Signed)
 Requested Prescriptions  Refused Prescriptions Disp Refills   carvedilol  (COREG ) 3.125 MG tablet [Pharmacy Med Name: CARVEDILOL  3.125MG  TABLETS] 180 tablet     Sig: TAKE 1 TABLET(3.125 MG) BY MOUTH TWICE DAILY WITH A MEAL     Cardiovascular: Beta Blockers 3 Failed - 07/06/2024  3:50 PM      Failed - Last BP in normal range    BP Readings from Last 1 Encounters:  03/02/24 (!) 162/78         Failed - Valid encounter within last 6 months    Recent Outpatient Visits           6 months ago Dyslipidemia associated with type 2 diabetes mellitus (HCC)   [facility] Glenard Mire, MD   6 months ago Mild episode of recurrent major depressive disorder   [facility] Glenard Mire, MD   6 months ago Urinary frequency   [place] [facility] Glenard Mire, MD   10 months ago Type 2 diabetes mellitus with microalbuminuria, without long-term current use of insulin  [facility])    [facility] [place], Mire, MD              Passed - Cr in normal range and within 360 days    Creat  Date Value Ref Range Status  12/15/2023 0.62 0.60 - 1.00 mg/dL Final   Creatinine, Ser  Date Value Ref Range Status  03/02/2024 0.54 0.44 - 1.00 mg/dL Final   Creatinine, Urine  Date Value Ref Range Status  12/15/2023 118 20 - 275 mg/dL Final         Passed - AST in normal range and within 360 days    AST  Date Value Ref Range Status  03/02/2024 28 15 - 41 U/L Final         Passed - ALT in normal range and within 360 days    ALT  Date Value Ref Range Status  03/02/2024 24 0 - 44 U/L Final         Passed - Last Heart Rate in normal range    Pulse Readings from Last 1 Encounters:  03/02/24 (!) 58

## 2024-07-07 ENCOUNTER — Other Ambulatory Visit: Payer: Self-pay | Admitting: Family Medicine

## 2024-07-07 DIAGNOSIS — G4700 Insomnia, unspecified: Secondary | ICD-10-CM

## 2024-07-08 NOTE — Telephone Encounter (Signed)
 Requested medication (s) are due for refill today: yes  Requested medication (s) are on the active medication list: yes  Last refill:  04/06/24  Future visit scheduled: yes  Notes to clinic:  Unable to refill per protocol, cannot delegate.      Requested Prescriptions  Pending Prescriptions Disp Refills   zolpidem  (AMBIEN ) 10 MG tablet [Pharmacy Med Name: ZOLPIDEM  10MG  TABLETS] 90 tablet     Sig: TAKE 1 TABLET(10 MG) BY MOUTH AT BEDTIME     Not Delegated - Psychiatry:  Anxiolytics/Hypnotics Failed - 07/08/2024  1:56 PM      Failed - This refill cannot be delegated      Failed - Urine Drug Screen completed in last 360 days      Failed - Valid encounter within last 6 months    Recent Outpatient Visits           6 months ago Dyslipidemia associated with type 2 diabetes mellitus [facility])   [facility] Glenard Mire, MD   6 months ago Mild episode of recurrent major depressive disorder   [facility] Glenard Mire, MD   6 months ago Urinary frequency   [place] [facility] [place], Mire, MD   10 months ago Type 2 diabetes mellitus with microalbuminuria, without long-term current use of insulin  [facility])   [facility] Glenard Mire, MD

## 2024-07-09 ENCOUNTER — Ambulatory Visit: Admitting: Family Medicine

## 2024-07-09 ENCOUNTER — Encounter: Payer: Self-pay | Admitting: Family Medicine

## 2024-07-09 VITALS — BP 122/74 | HR 78 | Resp 16 | Ht 63.0 in | Wt 162.6 lb

## 2024-07-09 DIAGNOSIS — F325 Major depressive disorder, single episode, in full remission: Secondary | ICD-10-CM | POA: Insufficient documentation

## 2024-07-09 DIAGNOSIS — I10 Essential (primary) hypertension: Secondary | ICD-10-CM

## 2024-07-09 DIAGNOSIS — K219 Gastro-esophageal reflux disease without esophagitis: Secondary | ICD-10-CM

## 2024-07-09 DIAGNOSIS — E1169 Type 2 diabetes mellitus with other specified complication: Secondary | ICD-10-CM | POA: Insufficient documentation

## 2024-07-09 DIAGNOSIS — N8110 Cystocele, unspecified: Secondary | ICD-10-CM | POA: Insufficient documentation

## 2024-07-09 DIAGNOSIS — E785 Hyperlipidemia, unspecified: Secondary | ICD-10-CM

## 2024-07-09 DIAGNOSIS — G4700 Insomnia, unspecified: Secondary | ICD-10-CM

## 2024-07-09 DIAGNOSIS — E119 Type 2 diabetes mellitus without complications: Secondary | ICD-10-CM

## 2024-07-09 DIAGNOSIS — I252 Old myocardial infarction: Secondary | ICD-10-CM

## 2024-07-09 DIAGNOSIS — Z7984 Long term (current) use of oral hypoglycemic drugs: Secondary | ICD-10-CM

## 2024-07-09 DIAGNOSIS — M545 Low back pain, unspecified: Secondary | ICD-10-CM | POA: Insufficient documentation

## 2024-07-09 DIAGNOSIS — E1129 Type 2 diabetes mellitus with other diabetic kidney complication: Secondary | ICD-10-CM | POA: Insufficient documentation

## 2024-07-09 LAB — POCT GLYCOSYLATED HEMOGLOBIN (HGB A1C): Hemoglobin A1C: 6.8 % — AB (ref 4.0–5.6)

## 2024-07-09 MED ORDER — ZOLPIDEM TARTRATE 10 MG PO TABS: 10.0000 mg | ORAL_TABLET | Freq: Every day | ORAL | 0 refills | Status: AC

## 2024-07-09 MED ORDER — METFORMIN HCL ER 750 MG PO TB24: 1500.0000 mg | ORAL_TABLET | Freq: Every day | ORAL | 1 refills | Status: AC

## 2024-07-09 MED ORDER — CARVEDILOL 3.125 MG PO TABS: 3.1250 mg | ORAL_TABLET | Freq: Two times a day (BID) | ORAL | 1 refills | Status: AC

## 2024-07-09 MED ORDER — CITALOPRAM HYDROBROMIDE 20 MG PO TABS: 20.0000 mg | ORAL_TABLET | Freq: Every day | ORAL | 1 refills | Status: AC

## 2024-07-09 MED ORDER — FAMOTIDINE 40 MG PO TABS: 40.0000 mg | ORAL_TABLET | Freq: Every evening | ORAL | 0 refills | Status: AC | PRN

## 2024-07-09 NOTE — Progress Notes (Signed)
 Name: Dawn  ZENIYAH Werner   MRN: [id number]    DOB: 1951/03/14   Date:07/09/2024       Progress Note  Subjective  Chief Complaint  Chief Complaint  Patient presents with   Medical Management of Chronic Issues   Discussed the use of AI scribe software for clinical note transcription with the patient, who gave verbal consent to proceed.  History of Present Illness Dawn  A Werner is a 74 year old female with pelvic floor dysfunction and chronic back pain who presents for follow-up on her medications and symptoms.  She missed her December 1st appointment, which led to issues with medication refills. She manages her medications, including Zolpidem  for sleep, which she has been taking for a long time.  She experiences pelvic pain and pressure in the vaginal area for a few years, accompanied by significant discharge and recurrent urinary tract infections. She was evaluated by a pelvic floor specialist at [facility], who discussed treatment options. She was prescribed topical estradiol cream, which has alleviated some anxiety and symptoms, though discharge persists. She has not tried a pessary and is considering surgical options.  She has chronic back pain, primarily in the lower back, just above the buttocks, without radiation to the legs. The pain varies in intensity. She has been prescribed Lyrica  (pregabalin ) for pain management but has not been taking it regularly. She also uses Tylenol  and Celebrex  as needed for pain relief.  She has a history of type 2 diabetes, hypertension, and dyslipidemia. She is currently taking metformin  for diabetes, losartan  and carvedilol  for blood pressure, and rosuvastatin  and ezetimibe  for cholesterol management.  She is managing depression, which is in remission, with Celexa  20 mg daily and Buspar  as needed for anxiety, though she has not been using Buspar  recently. For gastroesophageal reflux disease, she takes omeprazole  40 mg and uses additional medication as needed  for symptoms.    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   Primary osteoarthritis of knee 01/24/2015   Asthma, mild intermittent 11/18/2014   Arteriosclerosis of coronary artery 11/18/2014   Insomnia, persistent 11/18/2014   Dyslipidemia 11/18/2014   Acid reflux 11/18/2014   Diabetes type 2, controlled (HCC) 11/18/2014   Genital herpes in women 11/18/2014   Hypertension, benign 11/18/2014   Asymptomatic postmenopausal status 11/18/2014   History of acute myocardial infarction 11/18/2014    Past Surgical History:  Procedure Laterality Date   ABDOMINAL HYSTERECTOMY     BREAST SURGERY     breast reduction   COLONOSCOPY N/A 10/23/2023   Procedure: COLONOSCOPY;  Surgeon: Therisa Bi, MD;  Location: [facility] ENDOSCOPY;  Service: Gastroenterology;  Laterality: N/A;   REDUCTION MAMMAPLASTY Bilateral 1995   TOTAL HIP ARTHROPLASTY Left 03/31/2023   Procedure: TOTAL HIP ARTHROPLASTY ANTERIOR APPROACH;  Surgeon: Lorelle Hussar, MD;  Location: [facility] ORS;  Service: Orthopedics;  Laterality: Left;   TUBAL LIGATION      Family History  Problem Relation Age of Onset   Hyperlipidemia Mother    Hypertension Mother    Stroke Mother    Dementia  Father    Cancer Sister        breast   Breast cancer Sister 15   Asthma Brother    Depression Brother        bipolar   Heart disease Brother    Heart disease Brother     Social History   Tobacco Use   Smoking status: Never   Smokeless tobacco: Never  Substance  Use Topics   Alcohol use: No    Alcohol/week: 0.0 standard drinks of alcohol    Current Medications[1]  Allergies[2]  I personally reviewed active problem list, medication list, allergies, family history with the patient/caregiver today.   ROS  Ten systems reviewed and is negative except as mentioned in HPI    Objective Physical Exam  CONSTITUTIONAL: Patient appears well-developed and well-nourished.  No distress. HEENT: Head atraumatic, normocephalic, neck supple. CARDIOVASCULAR: Normal rate, regular rhythm and normal heart sounds.  No murmur heard. No BLE edema. PULMONARY: Effort normal and breath sounds normal. No respiratory distress. ABDOMINAL: There is no tenderness or distention. MUSCULOSKELETAL: Normal gait. Without gross motor or sensory deficit. PSYCHIATRIC: Patient has a normal mood and affect. behavior is normal. Judgment and thought content normal.  Vitals:   07/09/24 0939  BP: 122/74  Pulse: 78  Resp: 16  SpO2: 96%  Weight: 162 lb 9.6 oz (73.8 kg)  Height: 5' 3 (1.6 m)    Body mass index is 28.8 kg/m.  Recent Results (from the past 2160 hours)  POCT glycosylated hemoglobin (Hb A1C)     Status: Abnormal   Collection Time: 07/09/24  9:44 AM  Result Value Ref Range   Hemoglobin A1C 6.8 (A) 4.0 - 5.6 %   HbA1c POC (<> result, manual entry)     HbA1c, POC (prediabetic range)     HbA1c, POC (controlled diabetic range)       PHQ2/9:    07/09/2024    9:34 AM 01/07/2024   10:25 AM 12/25/2023   10:48 AM 12/15/2023    1:27 PM 12/05/2023    9:16 AM  Depression screen PHQ 2/9  Decreased Interest 0 1 1 1  0  Down, Depressed, Hopeless 0 1 1 1  0  PHQ - 2 Score 0 2 2 2  0  Altered sleeping 0 1 1 1 3   Tired, decreased energy 0 1 1 1  0  Change in appetite 0 0 0 0 0  Feeling bad or failure about yourself  0 0 0 0 0  Trouble concentrating 0 1 1 1  0  Moving slowly or fidgety/restless 0 0 0 0 0  Suicidal thoughts 0 0 0 0 0  PHQ-9 Score 0 5  5  5  3    Difficult  doing work/chores Not difficult at all Somewhat difficult Somewhat difficult Somewhat difficult Somewhat difficult     Data saved with a previous flowsheet row definition    phq 9 is negative  Fall Risk:    07/09/2024    9:34 AM 01/07/2024   10:19 AM 12/25/2023   10:45 AM 12/15/2023    1:27 PM 12/05/2023    9:14 AM  Fall Risk   Falls in the past year? 0 0 0 0 0  Number falls in past yr: 0 0 0 0 0  Injury with Fall? 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 Topical estradiol cream reduced anxiety but not fully resolved symptoms. No surgical intervention planned. Pessary discussed as an option, but may increase discharge. Surgery considered if quality of life is significantly affected. - Continue topical estradiol cream. - Consider pessary if symptoms worsen and discharge is not a concern. - Follow up with pelvic floor specialist if symptoms persist or worsen.  Type 2 diabetes mellitus with hypertension, dyslipidemia, and coronary artery disease Diabetes well-controlled with metformin . Blood pressure controlled with losartan  and carvedilol . Dyslipidemia managed with rosuvastatin  and ezetimibe . No symptoms of uncontrolled diabetes or cardiovascular issues. - Continue metformin , losartan , carvedilol , rosuvastatin , and ezetimibe . - Refilled metformin  and carvedilol  prescriptions.  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 Managed with Tylenol  and Celebrex  as needed. Pregabalin  prescribed but not taken regularly. -  Continue Tylenol  twice daily as needed. - Use Celebrex  during flare-ups, twice daily for 1-2 weeks. - Consider pregabalin  if pain worsens or becomes more frequent.  Gastroesophageal reflux disease Intermittent symptoms of burping, not daily. Managed with omeprazole . Caffeine identified as a potential trigger. - Continue omeprazole  40 mg daily. - Avoid caffeine and other known triggers. - Use additional medication at bedtime as needed for symptom control.  Major depressive disorder, in remission Depression in remission. Emotional well-being improved. Celexa  and Buspar  used as needed for anxiety. - Continue Celexa  20 mg daily. - Use Buspar  as needed for anxiety.  Insomnia Managed with zolpidem . Attempting to wean off by reducing frequency of use. - Continue zolpidem  with a plan to reduce frequency to half a pill twice a week. - Ensure 90-day supply lasts by adjusting dosage.        [1]  Current Outpatient Medications:    acetaminophen  (TYLENOL ) 500 MG tablet, Take 1,000 mg by mouth every 6 (six) hours as needed., Disp: , Rfl:    albuterol  (VENTOLIN  HFA) 108 (90 Base) MCG/ACT inhaler, Inhale 2-4 puffs into the lungs every 6 (six) hours as needed., Disp: 8 g, Rfl: 0   Blood Glucose Monitoring Suppl (ACCU-CHEK GUIDE ME) w/Device KIT, USE AS DIRECTED, Disp: 1 kit, Rfl: 0   busPIRone  (BUSPAR ) 5 MG tablet, Take 1 tablet (5 mg total) by mouth 2 (two) times daily as needed., Disp: 180 tablet, Rfl: 0   carvedilol  (COREG ) 3.125 MG tablet, TAKE 1 TABLET(3.125 MG) BY MOUTH TWICE DAILY WITH A MEAL, Disp: 60 tablet, Rfl: 0   celecoxib  (CELEBREX ) 100 MG capsule, Take 1 capsule (100 mg total) by mouth 2 (two) times daily as needed., Disp: 180 capsule, Rfl: 0   citalopram  (CELEXA ) 20 MG tablet, Take 1 tablet (20 mg total) by mouth daily., Disp: 90 tablet, Rfl: 1   docusate sodium  (COLACE) 100 MG capsule, Take 1 capsule (100 mg total) by mouth 2 (two) times daily., Disp: , Rfl:    estradiol (ESTRACE)  0.01 % CREA vaginal cream, Place 1 g vaginally., Disp: , Rfl:    ezetimibe  (ZETIA ) 10 MG tablet, Take 1 tablet (10 mg total) by mouth daily., Disp: 90 tablet, Rfl: 3   losartan  (COZAAR ) 25 MG tablet, Take 1 tablet (25 mg total) by mouth daily., Disp: 90 tablet, Rfl: 3   metFORMIN  (GLUCOPHAGE -XR) 750 MG  24 hr tablet, Take 2 tablets (1,500 mg total) by mouth daily with breakfast., Disp: 180 tablet, Rfl: 1   omeprazole  (PRILOSEC) 40 MG capsule, TAKE 1 CAPSULE(40 MG) BY MOUTH DAILY, Disp: 90 capsule, Rfl: 1   pregabalin  (LYRICA ) 50 MG capsule, Take 1 capsule (50 mg total) by mouth every evening., Disp: 90 capsule, Rfl: 1   rosuvastatin  (CRESTOR ) 40 MG tablet, Take 1 tablet (40 mg total) by mouth daily., Disp: 90 tablet, Rfl: 3   zolpidem  (AMBIEN ) 10 MG tablet, TAKE 1 TABLET(10 MG) BY MOUTH AT BEDTIME, Disp: 90 tablet, Rfl: 0 [2]  Allergies Allergen Reactions   Metformin  And Related Diarrhea    And nausea. Tolerates XR formulation

## 2024-11-04 ENCOUNTER — Ambulatory Visit: Admitting: Family Medicine

## 2024-12-16 ENCOUNTER — Ambulatory Visit
